# Patient Record
Sex: Female | Born: 1985
Health system: Southern US, Community
[De-identification: ages and names within clinical notes are randomized; demographics above are authoritative.]

## PROBLEM LIST (undated history)

## (undated) DIAGNOSIS — R112 Nausea with vomiting, unspecified: Secondary | ICD-10-CM

## (undated) DIAGNOSIS — Z9889 Other specified postprocedural states: Secondary | ICD-10-CM

## (undated) DIAGNOSIS — K219 Gastro-esophageal reflux disease without esophagitis: Secondary | ICD-10-CM

## (undated) DIAGNOSIS — F419 Anxiety disorder, unspecified: Secondary | ICD-10-CM

## (undated) DIAGNOSIS — Z789 Other specified health status: Secondary | ICD-10-CM

## (undated) DIAGNOSIS — D649 Anemia, unspecified: Secondary | ICD-10-CM

## (undated) HISTORY — PX: SKIN BIOPSY: SHX1

## (undated) HISTORY — DX: Anemia, unspecified: D64.9

---

## 2010-01-03 DIAGNOSIS — O321XX0 Maternal care for breech presentation, not applicable or unspecified: Secondary | ICD-10-CM

## 2014-01-15 LAB — OB RESULTS CONSOLE GC/CHLAMYDIA
CHLAMYDIA, DNA PROBE: NEGATIVE
GC PROBE AMP, GENITAL: NEGATIVE

## 2014-01-24 LAB — OB RESULTS CONSOLE ABO/RH: RH Type: POSITIVE

## 2014-01-24 LAB — OB RESULTS CONSOLE HEPATITIS B SURFACE ANTIGEN: Hepatitis B Surface Ag: NEGATIVE

## 2014-01-24 LAB — OB RESULTS CONSOLE ANTIBODY SCREEN: ANTIBODY SCREEN: NEGATIVE

## 2014-01-24 LAB — OB RESULTS CONSOLE RUBELLA ANTIBODY, IGM: RUBELLA: IMMUNE

## 2014-01-24 LAB — OB RESULTS CONSOLE RPR: RPR: NONREACTIVE

## 2014-01-24 LAB — OB RESULTS CONSOLE VARICELLA ZOSTER ANTIBODY, IGG: VARICELLA IGG: IMMUNE

## 2014-01-24 LAB — OB RESULTS CONSOLE HIV ANTIBODY (ROUTINE TESTING): HIV: NONREACTIVE

## 2014-08-16 ENCOUNTER — Ambulatory Visit
Admit: 2014-08-16 | Disposition: A | Discharge status: Home / Self Care | Payer: Self-pay | Attending: Obstetrics & Gynecology | Admitting: Obstetrics & Gynecology

## 2014-08-17 LAB — OB RESULTS CONSOLE GBS: GBS: POSITIVE

## 2014-08-31 ENCOUNTER — Encounter
Admission: RE | Admit: 2014-08-31 | Discharge: 2014-08-31 | Disposition: A | Discharge status: Home / Self Care | Payer: BLUE CROSS/BLUE SHIELD | Source: Ambulatory Visit | Attending: Obstetrics and Gynecology | Admitting: Obstetrics and Gynecology

## 2014-08-31 DIAGNOSIS — Z3490 Encounter for supervision of normal pregnancy, unspecified, unspecified trimester: Secondary | ICD-10-CM | POA: Insufficient documentation

## 2014-08-31 DIAGNOSIS — O3421 Maternal care for scar from previous cesarean delivery: Secondary | ICD-10-CM | POA: Insufficient documentation

## 2014-08-31 DIAGNOSIS — Z9889 Other specified postprocedural states: Secondary | ICD-10-CM | POA: Insufficient documentation

## 2014-08-31 HISTORY — DX: Other specified health status: Z78.9

## 2014-08-31 LAB — CBC WITH DIFFERENTIAL/PLATELET
Basophils Absolute: 0 10*3/uL (ref 0–0.1)
Basophils Relative: 0 %
EOS ABS: 0.2 10*3/uL (ref 0–0.7)
EOS PCT: 2 %
HCT: 30.6 % — ABNORMAL LOW (ref 35.0–47.0)
Hemoglobin: 9.6 g/dL — ABNORMAL LOW (ref 12.0–16.0)
Lymphocytes Relative: 17 %
Lymphs Abs: 1.8 10*3/uL (ref 1.0–3.6)
MCH: 24.4 pg — AB (ref 26.0–34.0)
MCHC: 31.5 g/dL — AB (ref 32.0–36.0)
MCV: 77.5 fL — ABNORMAL LOW (ref 80.0–100.0)
MONO ABS: 0.8 10*3/uL (ref 0.2–0.9)
Monocytes Relative: 8 %
Neutro Abs: 7.9 10*3/uL — ABNORMAL HIGH (ref 1.4–6.5)
Neutrophils Relative %: 73 %
PLATELETS: 241 10*3/uL (ref 150–440)
RBC: 3.95 MIL/uL (ref 3.80–5.20)
RDW: 15.1 % — AB (ref 11.5–14.5)
WBC: 10.8 10*3/uL (ref 3.6–11.0)

## 2014-08-31 LAB — TYPE AND SCREEN
ABO/RH(D): B POS
Antibody Screen: NEGATIVE

## 2014-08-31 LAB — ABO/RH: ABO/RH(D): B POS

## 2014-08-31 NOTE — Progress Notes (Signed)
Patient given CHG wipes, written info regarding cesarean section, incentive spirometry, infection prevention and instructions on CHG use.

## 2014-08-31 NOTE — Discharge Instructions (Signed)
Incentive Spirometer An incentive spirometer is a tool that can help keep your lungs clear and active. This tool measures how well you are filling your lungs with each breath. Taking long, deep breaths may help reverse or decrease the chance of developing breathing (pulmonary) problems (especially infection) following:  Surgery of the chest or abdomen.  Surgery if you have a history of smoking or a lung problem.  A long period of time when you are unable to move or be active. BEFORE THE PROCEDURE   If the spirometer includes an indicator to show your best effort, your nurse or respiratory therapist will set it to a desired goal.  If possible, sit up straight or lean slightly forward. Try not to slouch.  Hold the incentive spirometer in an upright position. INSTRUCTIONS FOR USE   Sit on the edge of your bed if possible, or sit up as far as you can in bed or on a chair.  Hold the incentive spirometer in an upright position.  Breathe out normally.  Place the mouthpiece in your mouth and seal your lips tightly around it.  Breathe in slowly and as deeply as possible, raising the piston or the ball toward the top of the column.  Hold your breath for 3-5 seconds or for as long as possible. Allow the piston or ball to fall to the bottom of the column.  Remove the mouthpiece from your mouth and breathe out normally.  Rest for a few seconds and repeat Steps 1 through 7 at least 10 times every 1-2 hours when you are awake. Take your time and take a few normal breaths between deep breaths.  The spirometer may include an indicator to show your best effort. Use the indicator as a goal to work toward during each repetition.  After each set of 10 deep breaths, practice coughing to be sure your lungs are clear. If you have an incision (the cut made at the time of surgery), support your incision when coughing by placing a pillow or rolled-up towels firmly against it. Once you are able to get out of  bed, walk around indoors and cough well. You may stop using the incentive spirometer when instructed by your caregiver.  RISKS AND COMPLICATIONS  Breathing too quickly may cause dizziness. At an extreme, this could cause you to pass out. Take your time so you do not get dizzy or light-headed.  If you are in pain, you may need to take or ask for pain medication before doing incentive spirometry. It is harder to take a deep breath if you are having pain. AFTER USE  Rest and breathe slowly and easily.  It can be helpful to keep a log of your progress. Your caregiver can provide you with a simple table to help with this. If you are using the spirometer at home, follow these instructions: [place] IF:   You are having difficultly using the spirometer.  You have trouble using the spirometer as often as instructed.  Your pain medication is not giving enough relief while using the spirometer.  You develop fever of 100.46F (38.1C) or higher. SEEK IMMEDIATE MEDICAL CARE IF:   You cough up bloody sputum that had not been present before.  You develop fever of 102F (38.9C) or greater.  You develop worsening pain at or near the incision site. MAKE SURE YOU:   Understand these instructions.  Will watch your condition.  Will get help right away if you are not doing well or  get worse. Document Released: 08/24/2006 Document Revised: 08/28/2013 Document Reviewed: 10/25/2006 [facility] Patient Information 2015 [place], [place]. This information is not intended to replace advice given to you by your health care provider. Make sure you discuss any questions you have with your health care provider. Surgical Site Infections FAQs What is a Surgical Site Infection (SSI)? A surgical site infection is an infection that occurs after surgery in the part of the body where the surgery took place. Most patients who have surgery do not develop an infection. However, infections develop in about 1 to 3  out of every 100 patients who have surgery. Some of the common symptoms of a surgical site infection are:  Redness and pain around the area where you had surgery  Drainage of cloudy fluid from your surgical wound  Fever Can SSIs be treated? Yes. Most surgical site infections can be treated with antibiotics. The antibiotic given to you depends on the bacteria (germs) causing the infections. Sometimes patients with SSIs also need another surgery to treat the infection. What are some of the things that hospitals are doing to prevent SSIs? To prevent SSIs, doctors, nurses, and other healthcare providers:  Clean their hands and arms up to their elbows with an antiseptic agent just before the surgery.  Clean their hands with soap and water or an alcohol-based hand rub before and after caring for each patient.  May remove some of your hair immediately before your surgery using electric clippers if the hair is in the same area where the procedure will occur. They should not shave you with a razor.  Wear special hair covers, masks, gowns, and gloves during surgery to keep the surgery area clean.  Give you antibiotics before your surgery starts. In most cases, you should get antibiotics within 60 minutes before the surgery starts and the antibiotics should be stopped within 24 hours after surgery.  Clean the skin at the site of your surgery with a special soap that kills germs. What can I do to help prevent SSIs? Before your surgery:  Tell your doctor about other medical problems you may have. Health problems such as allergies, diabetes, and obesity could affect your surgery and your treatment.  Quit smoking. Patients who smoke get more infections. Talk to your doctor about how you can quit before your surgery.  Do not shave near where you will have surgery. Shaving with a razor can irritate your skin and make it easier to develop an infection. At the time of your surgery:  Speak up if  someone tries to shave you with a razor before surgery. Ask why you need to be shaved and talk with your surgeon if you have any concerns.  Ask if you will get antibiotics before surgery. After your surgery:  Make sure that your healthcare providers clean their hands before examining you, either with soap and water or an alcohol-based hand rub.  If you do not see your providers clean their hands, please ask them to do so.  Family and friends who visit you should not touch the surgical wound or dressings.  Family and friends should clean their hands with soap and water or an alcohol-based hand rub before and after visiting you. If you do not see them clean their hands, ask them to clean their hands. What do I need to do when I go home from the hospital?  Before you go home, your doctor or nurse should explain everything you need to know about taking care of your  wound. Make sure you understand how to care for your wound before you leave the hospital.  Always clean your hands before and after caring for your wound.  Before you go home, make sure you know who to contact if you have questions or problems after you get home.  If you have any symptoms of an infection, such as redness and pain at the surgery site, drainage, or fever, call your doctor immediately. If you have additional questions, please ask your doctor or nurse. Developed and co-sponsored by [employer] for [place] [phone]); Infectious Diseases Society of [place] (IDSA); [place]; Association for Professionals in Infection Control and Epidemiology (APIC); Centers for Disease Control and Prevention (CDC); and [employer]. Document Released: 04/18/2013 Document Reviewed: 04/18/2013 [facility] Patient Information 2015 [place], [place]. This information is not intended to replace advice given to you by your health care provider. Make sure you discuss any questions you have with your  health care provider.

## 2014-09-01 LAB — RPR: RPR: NONREACTIVE

## 2014-09-01 LAB — HIV ANTIBODY (ROUTINE TESTING W REFLEX): HIV Screen 4th Generation wRfx: NONREACTIVE

## 2014-09-02 ENCOUNTER — Inpatient Hospital Stay
Admission: EM | Admit: 2014-09-02 | Discharge: 2014-09-06 | DRG: 766 | Disposition: A | Discharge status: Home / Self Care | Payer: BLUE CROSS/BLUE SHIELD | Attending: Obstetrics and Gynecology | Admitting: Obstetrics and Gynecology

## 2014-09-02 DIAGNOSIS — Z3490 Encounter for supervision of normal pregnancy, unspecified, unspecified trimester: Secondary | ICD-10-CM

## 2014-09-02 DIAGNOSIS — O34219 Maternal care for unspecified type scar from previous cesarean delivery: Secondary | ICD-10-CM

## 2014-09-02 DIAGNOSIS — Z98891 History of uterine scar from previous surgery: Secondary | ICD-10-CM

## 2014-09-02 DIAGNOSIS — O3421 Maternal care for scar from previous cesarean delivery: Secondary | ICD-10-CM | POA: Diagnosis present

## 2014-09-02 DIAGNOSIS — Z3A38 38 weeks gestation of pregnancy: Secondary | ICD-10-CM | POA: Diagnosis present

## 2014-09-02 DIAGNOSIS — O99824 Streptococcus B carrier state complicating childbirth: Principal | ICD-10-CM | POA: Diagnosis present

## 2014-09-02 HISTORY — DX: Other specified postprocedural states: Z98.890

## 2014-09-02 HISTORY — DX: Nausea with vomiting, unspecified: R11.2

## 2014-09-02 MED ORDER — FENTANYL CITRATE (PF) 100 MCG/2ML IJ SOLN
INTRAMUSCULAR | Status: AC
Start: 2014-09-02 — End: 2014-09-02
  Filled 2014-09-02: qty 2

## 2014-09-02 MED ORDER — LACTATED RINGERS IV BOLUS (SEPSIS)
500.0000 mL | Freq: Once | INTRAVENOUS | Status: AC
  Administered 2014-09-02: 500 mL via INTRAVENOUS

## 2014-09-02 MED ORDER — LACTATED RINGERS IV SOLN: INTRAVENOUS | Status: DC

## 2014-09-02 MED ORDER — FENTANYL CITRATE (PF) 100 MCG/2ML IJ SOLN
25.0000 ug | INTRAMUSCULAR | Status: AC
  Administered 2014-09-02: 21:00:00 via INTRAVENOUS

## 2014-09-02 MED ORDER — FENTANYL CITRATE (PF) 100 MCG/2ML IJ SOLN
50.0000 ug | Freq: Once | INTRAMUSCULAR | Status: AC
Start: 2014-09-02 — End: 2014-09-02
  Administered 2014-09-02: 50 ug via INTRAVENOUS

## 2014-09-02 MED ORDER — FENTANYL CITRATE (PF) 100 MCG/2ML IJ SOLN
INTRAMUSCULAR | Status: AC
  Administered 2014-09-02: 50 ug via INTRAVENOUS
  Filled 2014-09-02: qty 2

## 2014-09-02 NOTE — MAU Provider Note (Signed)
OB History & Physical   History of Present Illness:  Chief Complaint:   HPI:  Lisa Walker is a 29 y.o. G75P0011 female at [redacted]w[redacted]d dated by 7 week ultrasound.  Her pregnancy has been complicated by a history of prior cesarean delivery.  She presents to L&D for evaluation of contractions since this morning, getting gradually worse since mid afternoon.    She denies Leakage of fluid.   She denies Vaginal Bleeding.   She notes good Fetal movement.    Maternal Medical History:   Past Medical History  Diagnosis Date  . Medical history non-contributory   . PONV (postoperative nausea and vomiting)     Past Surgical History  Procedure Laterality Date  . Cesarean section N/A 2011    performed in [place]   Allergies: No Known Allergies  Home medications:  None          OB History  Gravida Para Term Preterm AB SAB TAB Ectopic Multiple Living  3 1   1 1    0 1    # Outcome Date GA Lbr Len/2nd Weight Sex Delivery Anes PTL Lv  3 Current           2 Para 01/03/10 [redacted]w[redacted]d  3.5 kg (7 lb 11.5 oz) F CS-Unspec Gen N Y     Complications: Breech delivery  1 SAB 11/25/08 [redacted]w[redacted]d   U SAB None N       Prenatal care site: [facility]  Social History: She  reports that she has never smoked. She has never used smokeless tobacco. She reports that she does not drink alcohol or use illicit drugs.  Family History: family history includes Diabetes in her father.   Review of Systems: Negative x 10 systems reviewed except as noted in the HPI.     Physical Exam:  Vital Signs: BP 116/84 mmHg  Pulse 105  Temp(Src) 97.4 F (36.3 C) (Oral)  Resp 16  LMP 11/27/2013 General: mild distress with contractions, tearful HEENT: normocephalic, atraumatic Heart: regular rate & rhythm.  No murmurs/rubs/gallops Lungs: clear to auscultation bilaterally Abdomen: soft, gravid, non-tender;  EFW: 7.5 pounds Pelvic:   External: Normal external female genitalia  Cervix: Dilation: 1 / Effacement (%): 50 / Station: -2    Extremities: non-tender, symmetric, no edema bilaterally.  DTRs: 2+  Neurologic: Alert & oriented x 3.    Pertinent Results:  Prenatal Labs: Blood type/Rh B positive  Antibody screen neg  Rubella Immune  RPR Non-reactive  HBsAg neg  HIV neg  GC neg  Chlamydia neg  Genetic screening  informaseq negative (XY)2  1 hour GTT 148  3 hour GTT wnl  GBS Positive on 08/17/14   Baseline FHR: baseline 135 beats per min    Variability: moderate    Accelerations: positive2    Decelerations: negative Contractions: present frequency: 3-4 q 10 min Overall assessment: category 1    Assessment:  Lisa Walker is a 29 y.o. G75P0011 female at [redacted]w[redacted]d with contractions.   Plan:  1. Labor: will re-check after two hours or prn. Scheduled to have repeat CD tomorrow. 2. FWB: reassuring overall 3. GBS positive.  Will Bonnet, MD, [place] 09/02/2014 6:15 PM

## 2014-09-02 NOTE — Anesthesia Preprocedure Evaluation (Signed)
Anesthesia Evaluation  Patient identified by MRN, date of birth, ID band Patient awake    Reviewed: Allergy & Precautions, NPO status , Patient's Chart, lab work & pertinent test results  History of Anesthesia Complications Negative for: history of anesthetic complications  Airway Mallampati: III  TM Distance: >3 FB Neck ROM: Full    Dental no notable dental hx.    Pulmonary neg pulmonary ROS,  breath sounds clear to auscultation  Pulmonary exam normal       Cardiovascular Exercise Tolerance: Good negative cardio ROS Normal cardiovascular examRhythm:Regular Rate:Normal     Neuro/Psych negative neurological ROS  negative psych ROS   GI/Hepatic negative GI ROS, Neg liver ROS,   Endo/Other  negative endocrine ROS  Renal/GU negative Renal ROS  negative genitourinary   Musculoskeletal negative musculoskeletal ROS (+)   Abdominal   Peds negative pediatric ROS (+)  Hematology negative hematology ROS (+)   Anesthesia Other Findings   Reproductive/Obstetrics (+) Pregnancy                             Anesthesia Physical Anesthesia Plan  ASA: II  Anesthesia Plan: Spinal   Post-op Pain Management:    Induction:   Airway Management Planned: Nasal Cannula  Additional Equipment:   Intra-op Plan:   Post-operative Plan:   Informed Consent: I have reviewed the patients History and Physical, chart, labs and discussed the procedure including the risks, benefits and alternatives for the proposed anesthesia with the patient or authorized representative who has indicated his/her understanding and acceptance.     Plan Discussed with: CRNA and Surgeon  Anesthesia Plan Comments:         Anesthesia Quick Evaluation

## 2014-09-02 NOTE — OB Triage Note (Signed)
29 yo female complains of contractions since this morning that became worse this afternoon and closer together.  States some bleeding noted with wiping.

## 2014-09-03 ENCOUNTER — Encounter
Admission: EM | Disposition: A | Discharge status: Home / Self Care | Payer: Self-pay | Source: Home / Self Care | Attending: Obstetrics and Gynecology

## 2014-09-03 ENCOUNTER — Encounter: Payer: Self-pay | Admitting: *Deleted

## 2014-09-03 ENCOUNTER — Observation Stay: Payer: BLUE CROSS/BLUE SHIELD | Admitting: Anesthesiology

## 2014-09-03 ENCOUNTER — Inpatient Hospital Stay
Admission: RE | Admit: 2014-09-03 | Payer: BLUE CROSS/BLUE SHIELD | Source: Ambulatory Visit | Admitting: Obstetrics and Gynecology

## 2014-09-03 DIAGNOSIS — Z98891 History of uterine scar from previous surgery: Secondary | ICD-10-CM

## 2014-09-03 DIAGNOSIS — O3421 Maternal care for scar from previous cesarean delivery: Secondary | ICD-10-CM | POA: Diagnosis present

## 2014-09-03 DIAGNOSIS — O99824 Streptococcus B carrier state complicating childbirth: Secondary | ICD-10-CM | POA: Diagnosis present

## 2014-09-03 DIAGNOSIS — Z3A38 38 weeks gestation of pregnancy: Secondary | ICD-10-CM | POA: Diagnosis present

## 2014-09-03 DIAGNOSIS — O99820 Streptococcus B carrier state complicating pregnancy: Secondary | ICD-10-CM | POA: Diagnosis present

## 2014-09-03 SURGERY — Surgical Case
Anesthesia: Spinal | Wound class: Clean Contaminated

## 2014-09-03 MED ORDER — DIBUCAINE 1 % RE OINT
1.0000 | TOPICAL_OINTMENT | RECTAL | Status: DC | PRN
Start: 2014-09-03 — End: 2014-09-06

## 2014-09-03 MED ORDER — FERROUS SULFATE 325 (65 FE) MG PO TABS
325.0000 mg | ORAL_TABLET | Freq: Two times a day (BID) | ORAL | Status: DC
  Administered 2014-09-03 – 2014-09-05 (×4): 325 mg via ORAL
  Filled 2014-09-03 (×5): qty 1

## 2014-09-03 MED ORDER — NALOXONE HCL 1 MG/ML IJ SOLN: 1.0000 ug/kg/h | INTRAVENOUS | Status: DC | PRN

## 2014-09-03 MED ORDER — FENTANYL CITRATE (PF) 100 MCG/2ML IJ SOLN: 25.0000 ug | INTRAMUSCULAR | Status: DC | PRN

## 2014-09-03 MED ORDER — BUPIVACAINE 0.25 % ON-Q PUMP DUAL CATH 400 ML
400.0000 mL | INJECTION | Status: DC
  Filled 2014-09-03: qty 400

## 2014-09-03 MED ORDER — CEFAZOLIN SODIUM-DEXTROSE 2-3 GM-% IV SOLR
2.0000 g | INTRAVENOUS | Status: AC
Start: 2014-09-03 — End: 2014-09-03
  Administered 2014-09-03: 2 g via INTRAVENOUS

## 2014-09-03 MED ORDER — SENNOSIDES-DOCUSATE SODIUM 8.6-50 MG PO TABS
2.0000 | ORAL_TABLET | ORAL | Status: DC
  Filled 2014-09-03: qty 2

## 2014-09-03 MED ORDER — DIPHENHYDRAMINE HCL 25 MG PO CAPS
25.0000 mg | ORAL_CAPSULE | ORAL | Status: DC | PRN
  Administered 2014-09-03 (×2): 25 mg via ORAL
  Filled 2014-09-03 (×2): qty 1

## 2014-09-03 MED ORDER — MENTHOL 3 MG MT LOZG
1.0000 | LOZENGE | OROMUCOSAL | Status: DC | PRN
  Administered 2014-09-04: 3 mg via ORAL
  Filled 2014-09-03: qty 9

## 2014-09-03 MED ORDER — LANOLIN HYDROUS EX OINT: 1.0000 "application " | TOPICAL_OINTMENT | CUTANEOUS | Status: DC | PRN

## 2014-09-03 MED ORDER — MEPERIDINE HCL 25 MG/ML IJ SOLN: 6.2500 mg | INTRAMUSCULAR | Status: DC | PRN

## 2014-09-03 MED ORDER — FENTANYL CITRATE (PF) 100 MCG/2ML IJ SOLN
INTRAMUSCULAR | Status: DC | PRN
  Administered 2014-09-03: 15 ug via INTRATHECAL

## 2014-09-03 MED ORDER — ONDANSETRON HCL 4 MG/2ML IJ SOLN: 4.0000 mg | Freq: Once | INTRAMUSCULAR | Status: DC | PRN

## 2014-09-03 MED ORDER — BUPIVACAINE HCL 0.5 % IJ SOLN
INTRAMUSCULAR | Status: DC | PRN
  Administered 2014-09-03: 10 mL

## 2014-09-03 MED ORDER — DIPHENHYDRAMINE HCL 25 MG PO CAPS: 25.0000 mg | ORAL_CAPSULE | Freq: Four times a day (QID) | ORAL | Status: DC | PRN

## 2014-09-03 MED ORDER — KETOROLAC TROMETHAMINE 30 MG/ML IJ SOLN
30.0000 mg | Freq: Four times a day (QID) | INTRAMUSCULAR | Status: DC | PRN
  Administered 2014-09-03: 30 mg via INTRAVENOUS
  Filled 2014-09-03 (×2): qty 1

## 2014-09-03 MED ORDER — EPHEDRINE SULFATE 50 MG/ML IJ SOLN
INTRAMUSCULAR | Status: DC | PRN
  Administered 2014-09-03: 15 mg via INTRAVENOUS

## 2014-09-03 MED ORDER — PHENYLEPHRINE HCL 10 MG/ML IJ SOLN
INTRAMUSCULAR | Status: DC | PRN
  Administered 2014-09-03: 100 ug via INTRAVENOUS

## 2014-09-03 MED ORDER — IBUPROFEN 800 MG PO TABS
800.0000 mg | ORAL_TABLET | Freq: Three times a day (TID) | ORAL | Status: DC
Start: 2014-09-04 — End: 2014-09-06
  Administered 2014-09-04 – 2014-09-06 (×6): 800 mg via ORAL
  Filled 2014-09-03 (×6): qty 1

## 2014-09-03 MED ORDER — NALOXONE HCL 0.4 MG/ML IJ SOLN: 0.4000 mg | INTRAMUSCULAR | Status: DC | PRN

## 2014-09-03 MED ORDER — BUPIVACAINE ON-Q PAIN PUMP (FOR ORDER SET NO CHG)
INJECTION | Status: DC
  Filled 2014-09-03: qty 1

## 2014-09-03 MED ORDER — CEFAZOLIN SODIUM-DEXTROSE 2-3 GM-% IV SOLR
INTRAVENOUS | Status: AC
  Filled 2014-09-03: qty 50

## 2014-09-03 MED ORDER — OXYTOCIN 40 UNITS IN LACTATED RINGERS INFUSION - SIMPLE MED
62.5000 mL/h | INTRAVENOUS | Status: AC
  Administered 2014-09-03: 62.5 mL/h via INTRAVENOUS
  Filled 2014-09-03: qty 1000

## 2014-09-03 MED ORDER — SIMETHICONE 80 MG PO CHEW
80.0000 mg | CHEWABLE_TABLET | Freq: Three times a day (TID) | ORAL | Status: DC
  Administered 2014-09-03 – 2014-09-06 (×8): 80 mg via ORAL
  Filled 2014-09-03 (×8): qty 1

## 2014-09-03 MED ORDER — WITCH HAZEL-GLYCERIN EX PADS: 1.0000 "application " | MEDICATED_PAD | CUTANEOUS | Status: DC | PRN

## 2014-09-03 MED ORDER — BUPIVACAINE 0.25 % ON-Q PUMP DUAL CATH 400 ML
INJECTION | Status: AC
  Filled 2014-09-03: qty 400

## 2014-09-03 MED ORDER — NALBUPHINE HCL 10 MG/ML IJ SOLN: 5.0000 mg | Freq: Once | INTRAMUSCULAR | Status: AC | PRN

## 2014-09-03 MED ORDER — SODIUM CHLORIDE 0.9 % IJ SOLN: 3.0000 mL | INTRAMUSCULAR | Status: DC | PRN

## 2014-09-03 MED ORDER — CHLORHEXIDINE GLUCONATE CLOTH 2 % EX PADS: 2.0000 | MEDICATED_PAD | Freq: Every day | CUTANEOUS | Status: DC

## 2014-09-03 MED ORDER — PRENATAL MULTIVITAMIN CH
1.0000 | ORAL_TABLET | Freq: Every day | ORAL | Status: DC
  Administered 2014-09-03 – 2014-09-06 (×4): 1 via ORAL
  Filled 2014-09-03 (×4): qty 1

## 2014-09-03 MED ORDER — BUPIVACAINE HCL (PF) 0.5 % IJ SOLN
INTRAMUSCULAR | Status: AC
  Filled 2014-09-03: qty 30

## 2014-09-03 MED ORDER — LACTATED RINGERS IV SOLN
INTRAVENOUS | Status: DC
  Administered 2014-09-03: 04:00:00 via INTRAVENOUS

## 2014-09-03 MED ORDER — LACTATED RINGERS IV BOLUS (SEPSIS)
1000.0000 mL | Freq: Once | INTRAVENOUS | Status: AC
  Administered 2014-09-03: 1700 mL via INTRAVENOUS
  Administered 2014-09-03: 01:00:00 via INTRAVENOUS

## 2014-09-03 MED ORDER — NALBUPHINE HCL 10 MG/ML IJ SOLN: 5.0000 mg | INTRAMUSCULAR | Status: DC | PRN

## 2014-09-03 MED ORDER — MORPHINE SULFATE (PF) 0.5 MG/ML IJ SOLN
INTRAMUSCULAR | Status: DC | PRN
  Administered 2014-09-03: .15 mg via EPIDURAL

## 2014-09-03 MED ORDER — OXYCODONE-ACETAMINOPHEN 5-325 MG PO TABS
1.0000 | ORAL_TABLET | ORAL | Status: DC | PRN
  Administered 2014-09-03 – 2014-09-06 (×12): 1 via ORAL
  Filled 2014-09-03 (×12): qty 1

## 2014-09-03 MED ORDER — DIPHENHYDRAMINE HCL 50 MG/ML IJ SOLN: 12.5000 mg | INTRAMUSCULAR | Status: DC | PRN

## 2014-09-03 MED ORDER — KETOROLAC TROMETHAMINE 30 MG/ML IJ SOLN: 30.0000 mg | Freq: Four times a day (QID) | INTRAMUSCULAR | Status: DC | PRN

## 2014-09-03 MED ORDER — ONDANSETRON HCL 4 MG/2ML IJ SOLN: 4.0000 mg | Freq: Three times a day (TID) | INTRAMUSCULAR | Status: DC | PRN

## 2014-09-03 MED ORDER — BUPIVACAINE HCL 0.25 % IJ SOLN: 5.0000 mL | Freq: Once | INTRAMUSCULAR | Status: DC

## 2014-09-03 MED ORDER — ZOLPIDEM TARTRATE 5 MG PO TABS
5.0000 mg | ORAL_TABLET | Freq: Every evening | ORAL | Status: DC | PRN
  Administered 2014-09-03: 5 mg via ORAL
  Filled 2014-09-03: qty 1

## 2014-09-03 MED ORDER — CEFAZOLIN SODIUM-DEXTROSE 2-3 GM-% IV SOLR
INTRAVENOUS | Status: AC
  Administered 2014-09-03: 2 g via INTRAVENOUS
  Filled 2014-09-03: qty 50

## 2014-09-03 MED ORDER — POTASSIUM CITRATE-CITRIC ACID 1100-334 MG/5ML PO SOLN
10.0000 meq | Freq: Three times a day (TID) | ORAL | Status: DC
  Administered 2014-09-03: 10 meq via ORAL

## 2014-09-03 MED ORDER — GUAIFENESIN ER 600 MG PO TB12
600.0000 mg | ORAL_TABLET | Freq: Two times a day (BID) | ORAL | Status: DC
  Administered 2014-09-03 – 2014-09-06 (×6): 600 mg via ORAL
  Filled 2014-09-03 (×5): qty 1

## 2014-09-03 MED ORDER — ONDANSETRON HCL 4 MG/2ML IJ SOLN
INTRAMUSCULAR | Status: DC | PRN
  Administered 2014-09-03: 4 mg via INTRAVENOUS

## 2014-09-03 MED ORDER — LACTATED RINGERS IV SOLN
INTRAVENOUS | Status: DC
  Administered 2014-09-03: via INTRAVENOUS

## 2014-09-03 SURGICAL SUPPLY — 27 items
CANISTER SUCT 3000ML (MISCELLANEOUS) ×3 IMPLANT
CATH KIT ON-Q SILVERSOAK 5IN (CATHETERS) ×3 IMPLANT
CHLORAPREP W/TINT 26ML (MISCELLANEOUS) ×3 IMPLANT
CLOSURE WOUND 1/2 X4 (GAUZE/BANDAGES/DRESSINGS) ×1
DRSG TELFA 3X8 NADH (GAUZE/BANDAGES/DRESSINGS) ×3 IMPLANT
ELECT CAUTERY BLADE 6.4 (BLADE) ×3 IMPLANT
GAUZE SPONGE 4X4 12PLY STRL (GAUZE/BANDAGES/DRESSINGS) ×3 IMPLANT
GLOVE BIO SURGEON STRL SZ7 (GLOVE) ×9 IMPLANT
GLOVE INDICATOR 7.5 STRL GRN (GLOVE) ×9 IMPLANT
GOWN STRL REUS W/ TWL LRG LVL3 (GOWN DISPOSABLE) ×1 IMPLANT
GOWN STRL REUS W/ TWL XL LVL3 (GOWN DISPOSABLE) ×2 IMPLANT
GOWN STRL REUS W/TWL LRG LVL3 (GOWN DISPOSABLE) ×2
GOWN STRL REUS W/TWL XL LVL3 (GOWN DISPOSABLE) ×4
LIQUID BAND (GAUZE/BANDAGES/DRESSINGS) ×3 IMPLANT
NS IRRIG 1000ML POUR BTL (IV SOLUTION) ×3 IMPLANT
PACK C SECTION AR (MISCELLANEOUS) ×3 IMPLANT
PAD GROUND ADULT SPLIT (MISCELLANEOUS) ×3 IMPLANT
PAD OB MATERNITY 4.3X12.25 (PERSONAL CARE ITEMS) ×3 IMPLANT
PAD PREP 24X41 OB/GYN DISP (PERSONAL CARE ITEMS) ×3 IMPLANT
STRIP CLOSURE SKIN 1/2X4 (GAUZE/BANDAGES/DRESSINGS) ×2 IMPLANT
SUT MAXON ABS #0 GS21 30IN (SUTURE) ×6 IMPLANT
SUT PLAIN 2 0 (SUTURE) ×4
SUT PLAIN ABS 2-0 CT1 27XMFL (SUTURE) ×2 IMPLANT
SUT VIC AB 1 CT1 36 (SUTURE) ×6 IMPLANT
SUT VIC AB 2-0 CT1 36 (SUTURE) ×3 IMPLANT
SUT VIC AB 4-0 FS2 27 (SUTURE) ×3 IMPLANT
SYRINGE 10CC LL (SYRINGE) ×3 IMPLANT

## 2014-09-03 NOTE — Anesthesia Procedure Notes (Signed)
Spinal Patient location during procedure: OR Start time: 09/03/2014 12:31 AM End time: 09/03/2014 12:33 AM Staffing Anesthesiologist: Lorane Gell Performed by: anesthesiologist  Preanesthetic Checklist Completed: patient identified, site marked, surgical consent, pre-op evaluation, timeout performed, IV checked, risks and benefits discussed and monitors and equipment checked Spinal Block Patient position: sitting Prep: ChloraPrep Patient monitoring: heart rate, continuous pulse ox and blood pressure Approach: midline Location: L3-4 Injection technique: single-shot Needle Needle type: [place]  Needle gauge: 25 G Needle length: 5 cm Assessment Sensory level: T4 Additional Notes Pt tolerated procedure well.  No immediate complications

## 2014-09-03 NOTE — Anesthesia Postprocedure Evaluation (Signed)
  Anesthesia Post-op Note  Patient: Lisa Walker  Procedure(s) Performed: Procedure(s): repeat CESAREAN SECTION (N/A)  Anesthesia type:Spinal  Patient location: PACU  Post pain: Pain level controlled  Post assessment: Post-op Vital signs reviewed, Patient's Cardiovascular Status Stable, Respiratory Function Stable, Patent Airway and No signs of Nausea or vomiting  Post vital signs: Reviewed and stable  Last Vitals:  Filed Vitals:   09/03/14 0600  BP: 115/67  Pulse: 96  Temp:   Resp: 18    Level of consciousness: awake, alert  and patient cooperative  Complications: No apparent anesthesia complications

## 2014-09-03 NOTE — Progress Notes (Signed)
Subjective: Postpartum Day 0 : Cesarean Delivery- repeat Patient reports no problems. Is ready to get up and moving. Pain is well controlled.   Objective: Vital signs in last 24 hours: Temp:  [96.8 F (36 C)-99.2 F (37.3 C)] 98 F (36.7 C) (05/09 1150) Pulse Rate:  [72-134] 72 (05/09 1150) Resp:  [16-20] 18 (05/09 1150) BP: (85-124)/(52-84) 96/52 mmHg (05/09 1150) SpO2:  [97 %-100 %] 98 % (05/09 0742)  Physical Exam:  General: alert, cooperative and no distress Lochia: appropriate Uterine Fundus: firm Incision: healing well, OR dressing in place- c/d/i DVT Evaluation: No evidence of DVT seen on physical exam. SCD's in place   No results for input(s): HGB, HCT in the last 72 hours.  Assessment/Plan: Status post Cesarean section. Doing well postoperatively.  Continue current care. Will d/c IVF and foley in the am on POD 1.  Continue breastfeeding.  PO medications for pain.   Louisa Second 09/03/2014, 11:57 AM

## 2014-09-03 NOTE — Transfer of Care (Signed)
Immediate Anesthesia Transfer of Care Note  Patient: Lisa Walker  Procedure(s) Performed: Procedure(s): repeat CESAREAN SECTION (N/A)  Patient Location: PACU  Anesthesia Type:Spinal  Level of Consciousness: awake and oriented  Airway & Oxygen Therapy: Patient Spontanous Breathing  Post-op Assessment: Report given to RN  Post vital signs: Reviewed and stable  Last Vitals:  Filed Vitals:   09/02/14 2207  BP: 124/79  Pulse: 95  Temp:   Resp:     Complications: No apparent anesthesia complications

## 2014-09-03 NOTE — Op Note (Signed)
Cesarean Section Procedure Note   Joelly Guia   09/03/2014   Pre-operative Diagnosis:  1) Intrauterine pregnancy at [redacted]w[redacted]d gestational age, 2) labor, 3) history of prior cesarean delivery, desires repeat.   Post-operative Diagnosis:  1) Intrauterine pregnancy at [redacted]w[redacted]d gestational age, 41) labor, 3) history of prior cesarean delivery, desires repeat.    Surgeon: Surgeon(s) and Role:      * Will Bonnet, MD   Assistants: Rodena Medin  Anesthesia: spinal  Findings:  1) normal appearing gravid uterus, fallopian tubes, and ovaries 2) viable female infant   Estimated Blood Loss: 750 ml  Total IV Fluids: 1,600 ml   Urine Output: 30 CC OF clear urine  Specimens: None  Complications: no complications  Disposition: PACU - hemodynamically stable.   Maternal Condition: stable   Baby condition / location:  Couplet care / Skin to Skin  Procedure Details:  The patient was seen in the Holding Room. The risks, benefits, complications, treatment options, and expected outcomes were discussed with the patient. The patient concurred with the proposed plan, giving informed consent. identified as [place] and the procedure verified as C-Section Delivery. A Time Out was held and the above information confirmed.   After induction of anesthesia, the patient was draped and prepped in the usual sterile manner. A Pfannenstiel incision was made and carried down through the subcutaneous tissue to the fascia. Fascial incision was made and extended transversely. The fascia was separated from the underlying rectus tissue superiorly and inferiorly. The peritoneum was identified and entered. Peritoneal incision was extended longitudinally. The bladder flap was not bluntly freed from the lower uterine segment. A low transverse uterine incision was made and the hysterotomy was extended with cranial-caudal tension. Delivered from cephalic presentation was a 3,190 gram Living newborn infant(s) with Apgar scores of 8 at  one minute and 9 at five minutes. Cord ph was not sent the umbilical cord was clamped and cut cord blood was not obtained for evaluation. The placenta was removed Intact and appeared normal. The uterine outline, tubes and ovaries appeared normal. The uterine incision was closed with running locked sutures of 0 Vicryl.  A second layer of the same suture was thrown in an imbricating fashion.  The uterus was returned to the abdomen and the paracolic gutters were cleared of all clots and debris.   The On-Q catheter pumps were inserted in accordance with the manufacturer's recommendations.  The catheters were inserted approximately 4cm cephelad to the incision line, approximately 1cm apart, straddling the midline.  They were inserted to a depth of the 4th mark. They were positioned superficial to the rectus abdominus muscles and deep to the rectus fascia.    The fascia was then reapproximated with running sutures of 1-0 PDS, looped. The skin was closed using the Insorb subcuticular stapler.  The skin closure was reinforced using benzoin and 1/2" steri-strips and a pressure dressing was placed.   The On-Q catheters were bolused with 5 mL of 0.5% marcaine plain for a total of 10 mL.  The catheters were affixed to the skin with surgical skin glue, steri-strips, and tegaderm.    Instrument, sponge, and needle counts were correct prior the abdominal closure and were correct at the conclusion of the case.  The patient received Ancef 2 gram IV prior to skin incision (within 30 minutes). For VTE prophylaxis she was wearing SCDs throughout the case.   Signed: Will Bonnet, MD, [place] 09/03/2014 1:48 AM

## 2014-09-03 NOTE — Lactation Note (Signed)
Baby sleepy at breast, pt shown how to position baby at breast with pillows and how to support breast, breast filling and sl. Firm, massaged to increase milk flow, baby latched well to left breast and had more difficulty on right as this nipple is larger and breast more firm, but able to latch with support and nursed well, with swallows and softening of breast noted.

## 2014-09-03 NOTE — OR Nursing (Signed)
Case was originally posted under Dr Ilda Basset.  Dr Ilda Basset did not do surgery, Surgeon was Dr.Jackson. Super user was unable to repost case.

## 2014-09-03 NOTE — OR Nursing (Signed)
Placenta sent to L&D fridge with ST

## 2014-09-03 NOTE — H&P (Signed)
  OB History & Physical   History of Present Illness:  Chief Complaint:   HPI:  Lisa Walker is a 29 y.o. G17P0011 female at [redacted]w[redacted]d dated by 7 week ultrasound. Her pregnancy has been complicated by a history of prior cesarean delivery. She presents to L&D for evaluation of contractions since this morning, getting gradually worse since mid afternoon.   She denies Leakage of fluid. She denies Vaginal Bleeding. She notes good Fetal movement.   Maternal Medical History:   Past Medical History  Diagnosis Date  . Medical history non-contributory   . PONV (postoperative nausea and vomiting)     Past Surgical History  Procedure Laterality Date  . Cesarean section N/A 2011    performed in [place]   Allergies: No Known Allergies  Home medications:  None          OB History  Gravida Para Term Preterm AB SAB TAB Ectopic Multiple Living  3 1   1 1    0 1    # Outcome Date GA Lbr Len/2nd Weight Sex Delivery Anes PTL Lv  3 Current           2 Para 01/03/10 [redacted]w[redacted]d  3.5 kg (7 lb 11.5 oz) F CS-Unspec Gen N Y   Complications: Breech delivery  1 SAB 11/25/08 [redacted]w[redacted]d   U SAB None N       Prenatal care site: [facility]  Social History: She  reports that she has never smoked. She has never used smokeless tobacco. She reports that she does not drink alcohol or use illicit drugs.  Family History: family history includes Diabetes in her father.   Review of Systems: Negative x 10 systems reviewed except as noted in the HPI.    Physical Exam:  Vital Signs: BP 116/84 mmHg  Pulse 105  Temp(Src) 97.4 F (36.3 C) (Oral)  Resp 16  LMP 11/27/2013 General: mild distress with contractions, tearful HEENT: normocephalic, atraumatic Heart: regular rate & rhythm. No murmurs/rubs/gallops Lungs: clear to auscultation bilaterally Abdomen: soft, gravid, non-tender; EFW: 7.5  pounds Pelvic:  External: Normal external female genitalia Cervix: Dilation: 1 / Effacement (%): 50 / Station: -2   Extremities: non-tender, symmetric, no edema bilaterally. DTRs: 2+  Neurologic: Alert & oriented x 3.   Pertinent Results:  Prenatal Labs: Blood type/Rh B positive  Antibody screen neg  Rubella Immune  RPR Non-reactive  HBsAg neg  HIV neg  GC neg  Chlamydia neg  Genetic screening informaseq negative (XY)2  1 hour GTT 148  3 hour GTT wnl  GBS Positive on 08/17/14   Baseline FHR: baseline 135 beats per min Variability: moderate Accelerations: positive2 Decelerations: negative Contractions: present frequency: 3-4 q 10 min Overall assessment: category 1    Assessment:  Lisa Walker is a 29 y.o. G66P0011 female at [redacted]w[redacted]d with contractions.   Plan: 1.  Labor: will re-check after two hours or prn. Scheduled to have repeat CD tomorrow. 2. FWB: reassuring overall 3. GBS positive.  Will Bonnet, MD, [place] 09/02/2014 6:15 PM       Addendum:  Patient has continued to have painful contractions.  She is now [redacted]w[redacted]d GA .  She has made some cervical change and will therefore proceed to cesarean delivery.  Reviewed risks/benefits/alternatives of cesarean delivery.  Reviewed blood transfusion consent/refusal.  She accepts blood transfusion in emergency.   Will Bonnet, MD, [place] 09/03/2014 12:01 AM

## 2014-09-03 NOTE — Plan of Care (Signed)
Problem: Phase I Progression Outcomes Goal: Voiding adequately Outcome: Not Progressing Has Foley.  Problem: Phase I Progression Outcomes Goal: IS, TCDB as ordered Outcome: Progressing With assist  Problem: Phase II Progression Outcomes Goal: Incision intact & without signs/symptoms of infection Outcome: Progressing Dsg. D&I

## 2014-09-04 LAB — CBC
HEMATOCRIT: 27.6 % — AB (ref 35.0–47.0)
HEMOGLOBIN: 8.6 g/dL — AB (ref 12.0–16.0)
MCH: 24 pg — ABNORMAL LOW (ref 26.0–34.0)
MCHC: 31.3 g/dL — AB (ref 32.0–36.0)
MCV: 76.7 fL — ABNORMAL LOW (ref 80.0–100.0)
Platelets: 238 10*3/uL (ref 150–440)
RBC: 3.6 MIL/uL — AB (ref 3.80–5.20)
RDW: 15.3 % — ABNORMAL HIGH (ref 11.5–14.5)
WBC: 11.2 10*3/uL — AB (ref 3.6–11.0)

## 2014-09-04 MED ORDER — LORATADINE 10 MG PO TABS
10.0000 mg | ORAL_TABLET | Freq: Every day | ORAL | Status: DC
  Administered 2014-09-04 – 2014-09-05 (×2): 10 mg via ORAL
  Filled 2014-09-04 (×2): qty 1

## 2014-09-04 MED ORDER — GUAIFENESIN ER 600 MG PO TB12
600.0000 mg | ORAL_TABLET | Freq: Two times a day (BID) | ORAL | Status: DC | PRN
  Filled 2014-09-04: qty 1

## 2014-09-04 NOTE — Progress Notes (Addendum)
  Post Partum Day 1 Subjective: up ad lib, voiding, tolerating PO and c/o cough  Objective: Blood pressure 117/86, pulse 83, temperature 98 F (36.7 C), temperature source Oral, resp. rate 18, last menstrual period 11/27/2013, SpO2 99 %, unknown if currently breastfeeding.  Physical Exam:  General: alert Lochia: appropriate Uterine Fundus: firm Incision: healing well DVT Evaluation: No evidence of DVT seen on physical exam. Abdomen: NT, mildly distended   Recent Labs  09/04/14 0628  HGB 8.6*  HCT 27.6*    Assessment POD #1, mild blood loss anemia  Plan: Plan for discharge tomorrow  Feeding: breast  Contraception: undecided B+/RI/VI TDAP given in pregnancy    Burlene Arnt, [place] 09/04/2014, 10:40 AM

## 2014-09-05 MED ORDER — PANTOPRAZOLE SODIUM 40 MG PO TBEC
40.0000 mg | DELAYED_RELEASE_TABLET | Freq: Two times a day (BID) | ORAL | Status: DC
  Administered 2014-09-05 – 2014-09-06 (×2): 40 mg via ORAL
  Filled 2014-09-05 (×2): qty 1

## 2014-09-05 MED ORDER — FAMOTIDINE 20 MG PO TABS
20.0000 mg | ORAL_TABLET | Freq: Two times a day (BID) | ORAL | Status: DC | PRN
  Administered 2014-09-05 (×2): 20 mg via ORAL
  Filled 2014-09-05 (×2): qty 1

## 2014-09-05 NOTE — Progress Notes (Signed)
Post op day 2 repeat CS  S: up ambulating. Complains of cough/ postnasal drainage/ epigastric pain (chronic-saw a Dr in [place] been on Zantac and another similar medicine), and lumbar sacral back pain. Breast feeding. Not ready for discharge  O: afebrile 117/67 Heart: RRR without murmur Lungs: rhonchi bilaterally that cleared with cough Urinating without difficulty. Passing flatus Lochia WNL.  ABD: soft, BS active. Incision with some dried blood, intact, no inflammation. ON Q intact No calf tenderness  A: POD #2 URI Epigastric discomfort Back pain  P: Change pepcid to Pantoprazole Continue Mucinex BID Encourage coughing, deep breathing Heating pad to back. Probable discharge in AM.

## 2014-09-06 ENCOUNTER — Encounter: Payer: Self-pay | Admitting: Obstetrics and Gynecology

## 2014-09-06 MED ORDER — IBUPROFEN 800 MG PO TABS: 800.0000 mg | ORAL_TABLET | Freq: Three times a day (TID) | ORAL | Status: DC

## 2014-09-06 MED ORDER — OXYCODONE-ACETAMINOPHEN 5-325 MG PO TABS: 1.0000 | ORAL_TABLET | ORAL | Status: DC | PRN

## 2014-09-06 MED ORDER — PANTOPRAZOLE SODIUM 40 MG PO TBEC: 40.0000 mg | DELAYED_RELEASE_TABLET | Freq: Two times a day (BID) | ORAL | Status: DC

## 2014-09-06 MED ORDER — LORATADINE 10 MG PO TABS: 10.0000 mg | ORAL_TABLET | Freq: Every day | ORAL | Status: DC

## 2014-09-06 NOTE — Discharge Summary (Signed)
Obstetric Discharge Summary Reason for Admission: onset of labor and cesarean section Prenatal Procedures: NST and ultrasound Intrapartum Procedures: cesarean: low cervical, transverse Postpartum Procedures: none Complications-Operative and Postpartum: morbidity HEMOGLOBIN  Date Value Ref Range Status  09/04/2014 8.6* 12.0 - 16.0 g/dL Final   HCT  Date Value Ref Range Status  09/04/2014 27.6* 35.0 - 47.0 % Final    Physical Exam:  General: alert, cooperative and no distress Lochia: appropriate Uterine Fundus: firm Incision: healing well, c/d/i  DVT Evaluation: No evidence of DVT seen on physical exam.  Discharge Diagnoses: Term Pregnancy-delivered  Discharge Information: Date: 09/06/2014 Activity: unrestricted Diet: routine Medications: PNV, Ibuprofen and Percocet Condition: stable Instructions: refer to practice specific booklet Discharge to: home Follow-up Information    Follow up with [facility]. Schedule an appointment as soon as possible for a visit in 4 days.   Why:  for postpartum follow up/incision check    Contact information:   [phone]      Newborn Data: Live born female  Birth Weight: 7 lb 0.5 oz (3189 g) APGAR: 8, 9  Home with mother.  Louisa Second 09/06/2014, 12:15 PM

## 2014-09-06 NOTE — Lactation Note (Signed)
This note was copied from the chart of [name]. IPatient Name: Lisa Walker KKDPT'E Date: 09/06/2014  assited mother on treating engorgment                      Lactation Tools Discussed/Used     Consult Status      Daryel November 09/06/2014, 4:46 PM

## 2014-09-06 NOTE — Progress Notes (Signed)
Patient understands all discharge instructions and the need to make follow up appointments. Patient discharge via wheelchair with auxillary. 

## 2014-09-12 ENCOUNTER — Inpatient Hospital Stay: Admission: RE | Admit: 2014-09-12 | Payer: Self-pay | Source: Ambulatory Visit

## 2014-09-18 ENCOUNTER — Encounter: Admission: RE | Payer: Self-pay | Source: Ambulatory Visit

## 2014-09-18 ENCOUNTER — Inpatient Hospital Stay
Admission: RE | Admit: 2014-09-18 | Payer: BLUE CROSS/BLUE SHIELD | Source: Ambulatory Visit | Admitting: Obstetrics and Gynecology

## 2014-09-18 SURGERY — Surgical Case
Anesthesia: Spinal

## 2015-10-18 ENCOUNTER — Ambulatory Visit
Admission: EM | Admit: 2015-10-18 | Discharge: 2015-10-18 | Disposition: A | Discharge status: Home / Self Care | Payer: BLUE CROSS/BLUE SHIELD | Attending: Family Medicine | Admitting: Family Medicine

## 2015-10-18 ENCOUNTER — Ambulatory Visit: Payer: BLUE CROSS/BLUE SHIELD | Attending: Family Medicine

## 2015-10-18 DIAGNOSIS — R059 Cough, unspecified: Secondary | ICD-10-CM

## 2015-10-18 DIAGNOSIS — J0110 Acute frontal sinusitis, unspecified: Secondary | ICD-10-CM

## 2015-10-18 DIAGNOSIS — R05 Cough: Secondary | ICD-10-CM | POA: Diagnosis not present

## 2015-10-18 MED ORDER — AMOXICILLIN 875 MG PO TABS: 875.0000 mg | ORAL_TABLET | Freq: Two times a day (BID) | ORAL | Status: DC

## 2015-10-18 MED ORDER — GUAIFENESIN-CODEINE 100-10 MG/5ML PO SOLN: ORAL | Status: DC

## 2015-10-18 NOTE — ED Provider Notes (Signed)
CSN: [id number]     Arrival date & time 10/18/15  1157 History   First MD Initiated Contact with Patient 10/18/15 1247     Chief Complaint  Patient presents with  . Cough  . Abdominal Pain   (Consider location/radiation/quality/duration/timing/severity/associated sxs/prior Treatment) Patient is a 30 y.o. female presenting with URI. The history is provided by the patient.  URI Presenting symptoms: congestion, cough and facial pain   Severity:  Moderate Onset quality:  Sudden Duration:  3 weeks Timing:  Constant Progression:  Worsening Chronicity:  New Relieved by:  Nothing Ineffective treatments:  OTC medications Associated symptoms: headaches and sinus pain   Associated symptoms: no wheezing   Risk factors: not elderly, no chronic cardiac disease, no chronic kidney disease, no chronic respiratory disease, no diabetes mellitus, no immunosuppression, no recent illness, no recent travel and no sick contacts     Past Medical History  Diagnosis Date  . Medical history non-contributory   . PONV (postoperative nausea and vomiting)    Past Surgical History  Procedure Laterality Date  . Cesarean section N/A 2011    performed in [place]  . Cesarean section N/A 09/03/2014    Procedure: repeat CESAREAN SECTION;  Surgeon: Aletha Halim, MD;  Location: [facility] ORS;  Service: Obstetrics;  Laterality: N/A;   Family History  Problem Relation Age of Onset  . Diabetes Father    Social History  Substance Use Topics  . Smoking status: Never Smoker   . Smokeless tobacco: Never Used  . Alcohol Use: No   OB History    Gravida Para Term Preterm AB TAB SAB Ectopic Multiple Living   3 2 1  1  1   0 2     Review of Systems  HENT: Positive for congestion.   Respiratory: Positive for cough. Negative for wheezing.   Neurological: Positive for headaches.    Allergies  Review of patient's allergies indicates no known allergies.  Home Medications   Prior to Admission medications   Medication  Sig Start Date End Date Taking? Authorizing Provider  amoxicillin (AMOXIL) 875 MG tablet Take 1 tablet (875 mg total) by mouth 2 (two) times daily. 10/18/15   Norval Gable, MD  guaiFENesin-codeine 100-10 MG/5ML syrup 5-10 ml po qhs prn cough 10/18/15   Norval Gable, MD  ibuprofen (ADVIL,MOTRIN) 800 MG tablet Take 1 tablet (800 mg total) by mouth every 8 (eight) hours. 09/06/14   Courtney Subudhi, CNM  loratadine (CLARITIN) 10 MG tablet Take 1 tablet (10 mg total) by mouth daily. 09/06/14   Louisa Second, CNM  oxyCODONE-acetaminophen (PERCOCET/ROXICET) 5-325 MG per tablet Take 1-2 tablets by mouth every 4 (four) hours as needed for moderate pain (for pain scale 4-7). 09/06/14   Courtney Subudhi, CNM  pantoprazole (PROTONIX) 40 MG tablet Take 1 tablet (40 mg total) by mouth 2 (two) times daily. 09/06/14   Louisa Second, CNM   Meds Ordered and Administered this Visit  Medications - No data to display  BP 102/63 mmHg  Pulse 72  Temp(Src) 98 F (36.7 C) (Oral)  Resp 16  Ht 5\' 4"  (1.626 m)  Wt 128 lb (58.06 kg)  BMI 21.96 kg/m2  SpO2 100%  LMP 10/04/2015 (Exact Date) No data found.   Physical Exam  Constitutional: She appears well-developed and well-nourished. No distress.  HENT:  Head: Normocephalic and atraumatic.  Right Ear: Tympanic membrane, external ear and ear canal normal.  Left Ear: Tympanic membrane, external ear and ear canal normal.  Nose: Mucosal edema and rhinorrhea  present. No nose lacerations, sinus tenderness, nasal deformity, septal deviation or nasal septal hematoma. No epistaxis.  No foreign bodies. Right sinus exhibits maxillary sinus tenderness and frontal sinus tenderness. Left sinus exhibits maxillary sinus tenderness and frontal sinus tenderness.  Mouth/Throat: Uvula is midline, oropharynx is clear and moist and mucous membranes are normal. No oropharyngeal exudate.  Eyes: Conjunctivae and EOM are normal. Pupils are equal, round, and reactive to light. Right eye  exhibits no discharge. Left eye exhibits no discharge. No scleral icterus.  Neck: Normal range of motion. Neck supple. No thyromegaly present.  Cardiovascular: Normal rate, regular rhythm and normal heart sounds.   Pulmonary/Chest: Effort normal and breath sounds normal. No respiratory distress. She has no wheezes. She has no rales.  Lymphadenopathy:    She has no cervical adenopathy.  Skin: She is not diaphoretic.  Nursing note and vitals reviewed.   ED Course  Procedures (including critical care time)  Labs Review Labs Reviewed - No data to display  Imaging Review Dg Chest 2 View  10/18/2015  CLINICAL DATA:  Chronic cough x 3 weeks and dry in nature. Pt describes she has a hard time breathing when coughing but other wise no SOB. Some congestion EXAM: CHEST  2 VIEW COMPARISON:  None. FINDINGS: The heart size and mediastinal contours are within normal limits. Both lungs are clear. No pleural effusion or pneumothorax. The visualized skeletal structures are unremarkable. IMPRESSION: Normal chest radiographs. Electronically Signed   By: Lajean Manes M.D.   On: 10/18/2015 13:19     Visual Acuity Review  Right Eye Distance:   Left Eye Distance:   Bilateral Distance:    Right Eye Near:   Left Eye Near:    Bilateral Near:         MDM   1. Cough   2. Acute frontal sinusitis, recurrence not specified    Discharge Medication List as of 10/18/2015  1:35 PM    START taking these medications   Details  amoxicillin (AMOXIL) 875 MG tablet Take 1 tablet (875 mg total) by mouth 2 (two) times daily., Starting 10/18/2015, Until Discontinued, Normal       1. x-ray results and diagnosis reviewed with patient 2. rx as per orders above; reviewed possible side effects, interactions, risks and benefits  3. Recommend supportive treatment with otc flonase 4. Follow-up prn if symptoms worsen or don't improve    Norval Gable, MD 10/18/15 1410

## 2015-10-18 NOTE — Discharge Instructions (Signed)
Cough, Adult Coughing is a reflex that clears your throat and your airways. Coughing helps to heal and protect your lungs. It is normal to cough occasionally, but a cough that happens with other symptoms or lasts a long time may be a sign of a condition that needs treatment. A cough may last only 2-3 weeks (acute), or it may last longer than 8 weeks (chronic). CAUSES Coughing is commonly caused by:  Breathing in substances that irritate your lungs.  A viral or bacterial respiratory infection.  Allergies.  Asthma.  Postnasal drip.  Smoking.  Acid backing up from the stomach into the esophagus (gastroesophageal reflux).  Certain medicines.  Chronic lung problems, including COPD (or rarely, lung cancer).  Other medical conditions such as heart failure. HOME CARE INSTRUCTIONS  Pay attention to any changes in your symptoms. Take these actions to help with your discomfort:  Take medicines only as told by your health care provider.  If you were prescribed an antibiotic medicine, take it as told by your health care provider. Do not stop taking the antibiotic even if you start to feel better.  Talk with your health care provider before you take a cough suppressant medicine.  Drink enough fluid to keep your urine clear or pale yellow.  If the air is dry, use a cold steam vaporizer or humidifier in your bedroom or your home to help loosen secretions.  Avoid anything that causes you to cough at work or at home.  If your cough is worse at night, try sleeping in a semi-upright position.  Avoid cigarette smoke. If you smoke, quit smoking. If you need help quitting, ask your health care provider.  Avoid caffeine.  Avoid alcohol.  Rest as needed. SEEK MEDICAL CARE IF:   You have new symptoms.  You cough up pus.  Your cough does not get better after 2-3 weeks, or your cough gets worse.  You cannot control your cough with suppressant medicines and you are losing sleep.  You  develop pain that is getting worse or pain that is not controlled with pain medicines.  You have a fever.  You have unexplained weight loss.  You have night sweats. SEEK IMMEDIATE MEDICAL CARE IF:  You cough up blood.  You have difficulty breathing.  Your heartbeat is very fast.   This information is not intended to replace advice given to you by your health care provider. Make sure you discuss any questions you have with your health care provider.   Document Released: 10/10/2010 Document Revised: 01/02/2015 Document Reviewed: 06/20/2014 Elsevier Interactive Patient Education 2016 Elsevier Inc. Sinusitis, Adult Sinusitis is redness, soreness, and inflammation of the paranasal sinuses. Paranasal sinuses are air pockets within the bones of your face. They are located beneath your eyes, in the middle of your forehead, and above your eyes. In healthy paranasal sinuses, mucus is able to drain out, and air is able to circulate through them by way of your nose. However, when your paranasal sinuses are inflamed, mucus and air can become trapped. This can allow bacteria and other germs to grow and cause infection. Sinusitis can develop quickly and last only a short time (acute) or continue over a long period (chronic). Sinusitis that lasts for more than 12 weeks is considered chronic. CAUSES Causes of sinusitis include:  Allergies.  Structural abnormalities, such as displacement of the cartilage that separates your nostrils (deviated septum), which can decrease the air flow through your nose and sinuses and affect sinus drainage.  Functional abnormalities,  such as when the small hairs (cilia) that line your sinuses and help remove mucus do not work properly or are not present. SIGNS AND SYMPTOMS Symptoms of acute and chronic sinusitis are the same. The primary symptoms are pain and pressure around the affected sinuses. Other symptoms include:  Upper toothache.  Earache.  Headache.  Bad  breath.  Decreased sense of smell and taste.  A cough, which worsens when you are lying flat.  Fatigue.  Fever.  Thick drainage from your nose, which often is green and may contain pus (purulent).  Swelling and warmth over the affected sinuses. DIAGNOSIS Your health care provider will perform a physical exam. During your exam, your health care provider may perform any of the following to help determine if you have acute sinusitis or chronic sinusitis:  Look in your nose for signs of abnormal growths in your nostrils (nasal polyps).  Tap over the affected sinus to check for signs of infection.  View the inside of your sinuses using an imaging device that has a light attached (endoscope). If your health care provider suspects that you have chronic sinusitis, one or more of the following tests may be recommended:  Allergy tests.  Nasal culture. A sample of mucus is taken from your nose, sent to a lab, and screened for bacteria.  Nasal cytology. A sample of mucus is taken from your nose and examined by your health care provider to determine if your sinusitis is related to an allergy. TREATMENT Most cases of acute sinusitis are related to a viral infection and will resolve on their own within 10 days. Sometimes, medicines are prescribed to help relieve symptoms of both acute and chronic sinusitis. These may include pain medicines, decongestants, nasal steroid sprays, or saline sprays. However, for sinusitis related to a bacterial infection, your health care provider will prescribe antibiotic medicines. These are medicines that will help kill the bacteria causing the infection. Rarely, sinusitis is caused by a fungal infection. In these cases, your health care provider will prescribe antifungal medicine. For some cases of chronic sinusitis, surgery is needed. Generally, these are cases in which sinusitis recurs more than 3 times per year, despite other treatments. HOME CARE  INSTRUCTIONS  Drink plenty of water. Water helps thin the mucus so your sinuses can drain more easily.  Use a humidifier.  Inhale steam 3-4 times a day (for example, sit in the bathroom with the shower running).  Apply a warm, moist washcloth to your face 3-4 times a day, or as directed by your health care provider.  Use saline nasal sprays to help moisten and clean your sinuses.  Take medicines only as directed by your health care provider.  If you were prescribed either an antibiotic or antifungal medicine, finish it all even if you start to feel better. SEEK IMMEDIATE MEDICAL CARE IF:  You have increasing pain or severe headaches.  You have nausea, vomiting, or drowsiness.  You have swelling around your face.  You have vision problems.  You have a stiff neck.  You have difficulty breathing.   This information is not intended to replace advice given to you by your health care provider. Make sure you discuss any questions you have with your health care provider.   Document Released: 04/13/2005 Document Revised: 05/04/2014 Document Reviewed: 04/28/2011 Elsevier Interactive Patient Education [employer].

## 2015-10-18 NOTE — ED Notes (Addendum)
Patient complains of a intermittent cough for the last three weeks. Patient reports that cough is mainly at nighttime. Patient states that she has been coughing so hard it will cause her to vomit. Patient states that she has tried cetrizine without relief. Patient also reports some abdominal pain that has been constant for a long time- patient states that she takes Rantidine at times to help with this.

## 2015-12-04 ENCOUNTER — Emergency Department
Admission: EM | Admit: 2015-12-04 | Discharge: 2015-12-04 | Disposition: A | Discharge status: Home / Self Care | Payer: BLUE CROSS/BLUE SHIELD | Attending: Emergency Medicine | Admitting: Emergency Medicine

## 2015-12-04 DIAGNOSIS — Z79899 Other long term (current) drug therapy: Secondary | ICD-10-CM | POA: Diagnosis not present

## 2015-12-04 DIAGNOSIS — R531 Weakness: Secondary | ICD-10-CM | POA: Diagnosis not present

## 2015-12-04 DIAGNOSIS — E538 Deficiency of other specified B group vitamins: Secondary | ICD-10-CM | POA: Insufficient documentation

## 2015-12-04 DIAGNOSIS — D500 Iron deficiency anemia secondary to blood loss (chronic): Secondary | ICD-10-CM | POA: Insufficient documentation

## 2015-12-04 DIAGNOSIS — E559 Vitamin D deficiency, unspecified: Secondary | ICD-10-CM | POA: Insufficient documentation

## 2015-12-04 LAB — CBC
HCT: 32.7 % — ABNORMAL LOW (ref 35.0–47.0)
Hemoglobin: 10.9 g/dL — ABNORMAL LOW (ref 12.0–16.0)
MCH: 26.3 pg (ref 26.0–34.0)
MCHC: 33.4 g/dL (ref 32.0–36.0)
MCV: 78.6 fL — AB (ref 80.0–100.0)
PLATELETS: 238 10*3/uL (ref 150–440)
RBC: 4.16 MIL/uL (ref 3.80–5.20)
RDW: 17.6 % — ABNORMAL HIGH (ref 11.5–14.5)
WBC: 6.3 10*3/uL (ref 3.6–11.0)

## 2015-12-04 LAB — URINALYSIS COMPLETE WITH MICROSCOPIC (ARMC ONLY)
Bacteria, UA: NONE SEEN
Bilirubin Urine: NEGATIVE
Glucose, UA: NEGATIVE mg/dL
HGB URINE DIPSTICK: NEGATIVE
KETONES UR: NEGATIVE mg/dL
Nitrite: NEGATIVE
PH: 8 (ref 5.0–8.0)
PROTEIN: NEGATIVE mg/dL
RBC / HPF: NONE SEEN RBC/hpf (ref 0–5)
SPECIFIC GRAVITY, URINE: 1.004 — AB (ref 1.005–1.030)

## 2015-12-04 LAB — BASIC METABOLIC PANEL
Anion gap: 6 (ref 5–15)
BUN: 13 mg/dL (ref 6–20)
CALCIUM: 9 mg/dL (ref 8.9–10.3)
CHLORIDE: 109 mmol/L (ref 101–111)
CO2: 24 mmol/L (ref 22–32)
CREATININE: 0.63 mg/dL (ref 0.44–1.00)
GFR calc non Af Amer: >60 mL/min (ref >60)
Glucose, Bld: 114 mg/dL — ABNORMAL HIGH (ref 65–99)
Potassium: 3.8 mmol/L (ref 3.5–5.1)
SODIUM: 139 mmol/L (ref 135–145)

## 2015-12-04 LAB — POCT PREGNANCY, URINE: Preg Test, Ur: NEGATIVE

## 2015-12-04 NOTE — ED Triage Notes (Addendum)
Pt arrives to ER via POV c/o weakness upon standing with fatigue. Pt had implanon placed in arm X 10 weeks ago and has experienced vaginal bleeding for the entire time up until 2 days ago. Pt was started on an oral contraceptive at well to "stop bleeding" per husband. Pt hemoglobin has been checked X 2 since bleeding has begun and was "9" per husband. Pt weak upon standing since Monday. Pt alert and oriented X4, active, cooperative, pt in NAD. RR even and unlabored, color WNL.  Pt reports mild dizziness. When questioned if patient appeared pale to husband, he denies.

## 2015-12-04 NOTE — ED Provider Notes (Signed)
[facility] Emergency Department Provider Note  Time seen: 2:50 PM  I have reviewed the triage vital signs and the nursing notes.   HISTORY  Chief Complaint Weakness    HPI Lisa Walker is a 30 y.o. female with no past medical history who presents the emergency department for lower extremity weakness. According to the patient and her husband for the past several weeks the patient has had vaginal bleeding since having Implanon implanted. She was placed on birth control tablets, and the bleeding stopped 2 days ago. Husband states the blood level trended down from a 10 to 9. He has noted of the past several days that the patient has been experiencing lower extremity weakness which she describes as slumping while walking. Patient denies any numbness. Patient states some lower back pain. No urinary complaints. Patient states her vaginal bleeding has stopped.Denies headache. Denies fever.  Past Medical History:  Diagnosis Date  . Medical history non-contributory   . PONV (postoperative nausea and vomiting)     Patient Active Problem List   Diagnosis Date Noted  . Indication for care in labor or delivery 09/03/2014  . S/P cesarean section 09/03/2014  . Supervision of normal pregnancy 09/02/2014  . H/O cesarean section complicating pregnancy [id number]    Past Surgical History:  Procedure Laterality Date  . CESAREAN SECTION N/A 2011   performed in [place]  . CESAREAN SECTION N/A 09/03/2014   Procedure: repeat CESAREAN SECTION;  Surgeon: Aletha Halim, MD;  Location: [facility] ORS;  Service: Obstetrics;  Laterality: N/A;    Prior to Admission medications   Medication Sig Start Date End Date Taking? Authorizing Provider  amoxicillin (AMOXIL) 875 MG tablet Take 1 tablet (875 mg total) by mouth 2 (two) times daily. 10/18/15   Norval Gable, MD  guaiFENesin-codeine 100-10 MG/5ML syrup 5-10 ml po qhs prn cough 10/18/15   Norval Gable, MD  ibuprofen (ADVIL,MOTRIN) 800 MG  tablet Take 1 tablet (800 mg total) by mouth every 8 (eight) hours. 09/06/14   Courtney Subudhi, CNM  loratadine (CLARITIN) 10 MG tablet Take 1 tablet (10 mg total) by mouth daily. 09/06/14   Louisa Second, CNM  oxyCODONE-acetaminophen (PERCOCET/ROXICET) 5-325 MG per tablet Take 1-2 tablets by mouth every 4 (four) hours as needed for moderate pain (for pain scale 4-7). 09/06/14   Courtney Subudhi, CNM  pantoprazole (PROTONIX) 40 MG tablet Take 1 tablet (40 mg total) by mouth 2 (two) times daily. 09/06/14   Louisa Second, CNM    Allergies  Allergen Reactions  . Pineapple     Family History  Problem Relation Age of Onset  . Diabetes Father     Social History Social History  Substance Use Topics  . Smoking status: Never Smoker  . Smokeless tobacco: Never Used  . Alcohol use No    Review of Systems Constitutional: Negative for fever. Cardiovascular: Negative for chest pain. Respiratory: Negative for shortness of breath. Gastrointestinal: Negative for abdominal pain Genitourinary: Negative for dysuria. Musculoskeletal: Mild lower back pain. Neurological: Negative for headache 10-point ROS otherwise negative.  ____________________________________________   PHYSICAL EXAM:  VITAL SIGNS: ED Triage Vitals [12/04/15 1408]  Enc Vitals Group     BP 103/61     Pulse Rate 65     Resp 18     Temp 98.3 F (36.8 C)     Temp Source Oral     SpO2 99 %     Weight 158 lb (71.7 kg)     Height  Head Circumference      Peak Flow      Pain Score      Pain Loc      Pain Edu?      Excl. in [place]?     Constitutional: Alert and oriented. Well appearing and in no distress. Eyes: Normal exam ENT   Head: Normocephalic and atraumatic.   Mouth/Throat: Mucous membranes are moist. Cardiovascular: Normal rate, regular rhythm. No murmur Respiratory: Normal respiratory effort without tachypnea nor retractions. Breath sounds are clear  Gastrointestinal: Soft and nontender. No  distention.   Musculoskeletal: Nontender with normal range of motion in all extremities. Neurologic:  Normal speech and language. No gross focal neurologic deficits are appreciated. Equal grip strengths bilaterally. 5/5 motor in all extremities. No pronator drift. No lower extremity drift. Patient is able to ambulate however during ambulation she keeps her knees bent. When asked to stand up straight she is able to stand up straight.  When asked to raise her knee to my hand which is above her waist, she is able to do so. When asked to bend over and touch her toes she is able to do so with no ataxia. Skin:  Skin is warm, dry and intact.  Psychiatric: Mood and affect are normal. Speech and behavior are normal.   ____________________________________________    EKG  EKG reviewed and interpreted by myself shows normal sinus rhythm at 67 bpm, narrow QRS, normal axis, normal intervals, no ST changes. Normal EKG.  INITIAL IMPRESSION / ASSESSMENT AND PLAN / ED COURSE  Pertinent labs & imaging results that were available during my care of the patient were reviewed by me and considered in my medical decision making (see chart for details).  Patient presents with lower extremity weakness only during ambulation. Overall the patient appears well with a normal neurologic exam, however when she ambulates she keeps her knees bent, when asked to specifically stand up straight, she is able to do so. She states she is not aware that her knees are bent his do not give out on her, she is able to maintain her muscle strength to keep her knees bent and to ambulate. Patient has good muscle tone. Good muscle strength in all extremities. It is unclear what is causing the patient's lower extremity weakness during ambulation only. Patient appears to have good core strength. No ataxia.  Labs are within normal limits. I have referred the patient to Dr. Gurney Maxin for further  evaluation.  ____________________________________________   FINAL CLINICAL IMPRESSION(S) / ED DIAGNOSES  Lower extremity weakness    Harvest Dark, MD 12/04/15 [phone]

## 2015-12-04 NOTE — ED Notes (Signed)
MD at bedside. 

## 2015-12-04 NOTE — ED Notes (Signed)
Reports recent heavy bleeding after new bc.  States stopped bleeding 2 days ago but today started feeling weak.  States when standing her knees "couldnt hold her up well".  Skin w/d and slightly pale. MAE, PERRL.

## 2015-12-12 ENCOUNTER — Encounter: Payer: Self-pay | Admitting: Physical Therapy

## 2015-12-12 ENCOUNTER — Ambulatory Visit: Payer: BLUE CROSS/BLUE SHIELD | Attending: Neurology | Admitting: Physical Therapy

## 2015-12-12 DIAGNOSIS — R2681 Unsteadiness on feet: Secondary | ICD-10-CM | POA: Diagnosis present

## 2015-12-12 DIAGNOSIS — M79604 Pain in right leg: Secondary | ICD-10-CM | POA: Diagnosis present

## 2015-12-12 DIAGNOSIS — R29898 Other symptoms and signs involving the musculoskeletal system: Secondary | ICD-10-CM | POA: Insufficient documentation

## 2015-12-12 DIAGNOSIS — M6281 Muscle weakness (generalized): Secondary | ICD-10-CM | POA: Diagnosis present

## 2015-12-12 DIAGNOSIS — R269 Unspecified abnormalities of gait and mobility: Secondary | ICD-10-CM | POA: Diagnosis present

## 2015-12-12 DIAGNOSIS — R262 Difficulty in walking, not elsewhere classified: Secondary | ICD-10-CM | POA: Insufficient documentation

## 2015-12-12 DIAGNOSIS — M79605 Pain in left leg: Secondary | ICD-10-CM | POA: Diagnosis present

## 2015-12-12 NOTE — Therapy (Signed)
[facility] [address] [place], [place], [address] Phone: [phone]   Fax:  [phone]  Physical Therapy Evaluation  Patient Details  Name: Lisa Walker MRN: [id number] Date of Birth: 1986-03-20 Referring Provider: Gurney Maxin MD  Encounter Date: 12/12/2015      PT End of Session - 12/12/15 0820    Visit Number 1   Number of Visits 8   Date for PT Re-Evaluation 01/09/16   PT Start Time 0810   PT Stop Time 0901   PT Time Calculation (min) 51 min   Equipment Utilized During Treatment Gait belt   Activity Tolerance Patient tolerated treatment well;Patient limited by pain;Patient limited by fatigue   Behavior During Therapy [facility] for tasks assessed/performed      Past Medical History:  Diagnosis Date  . Medical history non-contributory   . PONV (postoperative nausea and vomiting)     Past Surgical History:  Procedure Laterality Date  . CESAREAN SECTION N/A 2011   performed in [place]  . CESAREAN SECTION N/A 09/03/2014   Procedure: repeat CESAREAN SECTION;  Surgeon: Aletha Halim, MD;  Location: [facility] ORS;  Service: Obstetrics;  Laterality: N/A;    There were no vitals filed for this visit.       Subjective Assessment - 12/12/15 0815    Subjective Pt. reports B quad/ hip/ L shoulder/ back pain currently at rest (6.5/10).  Pt. reports she has no energy in her legs to move/ walk.  Sudden onset of symptoms.     Pertinent History pt. reports sudden loss of ability to walk but husband states she is able to walk with CGA into PT clinic.  Pt. reports B quad/ hip pain currently at rest.     Limitations Standing;Walking;House hold activities   How long can you stand comfortably? 5 min   How long can you walk comfortably? 5 min (husband assist since 8/16)   Patient Stated Goals increase B LE muscle strength/ decrease LE pain/ improve gait   Currently in Pain? Yes   Pain Score 7    Pain Location Back   Multiple Pain Sites Yes    Pain Score 7   Pain Location Leg   Pain Orientation Right;Left   Pain Type Chronic pain        Objective:  Therapeutic Exercise: Standing balance including: static standing 2 minutes; tandem stance 1 minute, eyes closed static balance 10 seconds. Review of HEP program with husband including: SLR, bridge, standing hip abd/ext, sit<>stand with unilateral UE support, LAQ. Ambulation with RW approx 124ft - pt demonstrates improved gait strategy with more consistent L knee flexion during gait cycle.  See HEP.   Pt response for medical necessity: Pt with decreased exercise tolerance this session secondary to early onset of fatigue and moderate-severe pain in LE/hip and back.        PT Education - 12/12/15 0834    Education provided Yes   Education Details See HEP (seated/standing ex.)   Person(s) Educated Patient;Spouse   Methods Explanation;Demonstration;Tactile cues;Handout   Comprehension Verbalized understanding;Returned demonstration;Verbal cues required             PT Long Term Goals - 12/12/15 1612      PT LONG TERM GOAL #1   Title Pt will score >40/80 on LEFS to promote increase in functional mobility   Baseline 8/17: 26/80   Time 4   Period Weeks   Status New     PT LONG TERM GOAL #2  Title Pt will score >40/56 on BERG to promote increase in amb/standing stability and safety   Baseline 8/17: 32/56   Time 4   Period Weeks   Status New     PT LONG TERM GOAL #3   Title Pt will tolerate >20 minutes of continued standing exercise to promote return to PLOF   Baseline pt able to tolerate <5 minutes currently before requiring seated rest break secondary to weakness and onset of pain   Time 4   Period Weeks   Status New     PT LONG TERM GOAL #4   Title Pt will perform sit<>stand with no UE support with <1/10 pain in LE to promote return of functional mobility   Baseline Pt unable to perform sit<>stand without UE assist; tendency for decreased speed/power   Time 4    Period Weeks   Status New             Plan - 12/12/15 0820    Clinical Impression Statement Pt. is a pleasant 30 y/o female with sudden onset of gait difficulty/ reported leg weakness (11/26/15).  Pt. is assisted to PT by husband due to balance issues/ gait difficulty (HHA in hallway).  B UE/ LE AROM WNL (pain focused with L shoulder MMT and certain movement patterns).  B UE muscle strength grossly 4+/5 MMT except triceps 3-/5 MMT/ sh. flexion 5/5 MMT.  Grip strength: R 35.7#, L 19.5#.  B LE muscle strength grossly 4/5 except knee flexion 4+/5, DF/PF 3+ bilaterally (pain limited) MMT (testing limited by pain/ fear of pain).  Pt. ambulates with slow, R antalgic gait pattern with decrease step length, esp. on L LE.  B knees remain in slight flexed position with moderate muscle fasciculations noted, esp. in R LE.  Pt. requires several short standing rest breaks to ambulate 20 feet.  LEFS: 26/80, Berg balance test: 32/56.  Pt. will benefit from skilled PT services to increase B LE muscle strength to improve independence with gait and safety to promote return to PLOF.           Rehab Potential Good   PT Frequency 2x / week   PT Duration 4 weeks   PT Treatment/Interventions ADLs/Self Care Home Management;Gait training;Stair training;Functional mobility training;Therapeutic activities;Therapeutic exercise;Balance training;Patient/family education;Neuromuscular re-education;Manual techniques;Passive range of motion;Energy conservation   PT Next Visit Plan Review HEP/ reassess gait   PT Home Exercise Plan See HEP   Consulted and Agree with Plan of Care Patient;Family member/caregiver      Patient will benefit from skilled therapeutic intervention in order to improve the following deficits and impairments:  Abnormal gait, Improper body mechanics, Pain, Postural dysfunction, Decreased mobility, Decreased coordination, Decreased activity tolerance, Decreased endurance, Decreased range of motion, Decreased  strength, Difficulty walking, Decreased safety awareness, Decreased balance  Visit Diagnosis: Muscle weakness (generalized)  Gait difficulty  Unsteady gait  Pain In Right Leg  Pain In Left Leg     Problem List Patient Active Problem List   Diagnosis Date Noted  . Indication for care in labor or delivery 09/03/2014  . S/P cesarean section 09/03/2014  . Supervision of normal pregnancy 09/02/2014  . H/O cesarean section complicating pregnancy [id number]   Pura Spice, PT, DPT # [place], SPT 12/13/2015, 12:34 PM  [place] [facility] [address] [place], [place], [address] Phone: [phone]   Fax:  [phone]  Name: Lisa Walker MRN: [id number] Date of Birth: 21-Apr-1986

## 2015-12-16 DIAGNOSIS — E559 Vitamin D deficiency, unspecified: Secondary | ICD-10-CM | POA: Insufficient documentation

## 2015-12-16 DIAGNOSIS — E538 Deficiency of other specified B group vitamins: Secondary | ICD-10-CM | POA: Insufficient documentation

## 2015-12-17 ENCOUNTER — Ambulatory Visit: Payer: BLUE CROSS/BLUE SHIELD | Admitting: Physical Therapy

## 2015-12-17 ENCOUNTER — Encounter: Payer: Self-pay | Admitting: Physical Therapy

## 2015-12-17 ENCOUNTER — Encounter: Payer: Self-pay | Admitting: Psychiatry

## 2015-12-17 ENCOUNTER — Ambulatory Visit (INDEPENDENT_AMBULATORY_CARE_PROVIDER_SITE_OTHER): Payer: BLUE CROSS/BLUE SHIELD | Admitting: Psychiatry

## 2015-12-17 DIAGNOSIS — R269 Unspecified abnormalities of gait and mobility: Secondary | ICD-10-CM

## 2015-12-17 DIAGNOSIS — R2681 Unsteadiness on feet: Secondary | ICD-10-CM

## 2015-12-17 DIAGNOSIS — F444 Conversion disorder with motor symptom or deficit: Secondary | ICD-10-CM | POA: Diagnosis not present

## 2015-12-17 DIAGNOSIS — M6281 Muscle weakness (generalized): Secondary | ICD-10-CM | POA: Diagnosis not present

## 2015-12-17 DIAGNOSIS — M79605 Pain in left leg: Secondary | ICD-10-CM

## 2015-12-17 DIAGNOSIS — M79604 Pain in right leg: Secondary | ICD-10-CM

## 2015-12-17 MED ORDER — DIAZEPAM 2 MG PO TABS: 2.0000 mg | ORAL_TABLET | Freq: Every evening | ORAL | 0 refills | Status: DC | PRN

## 2015-12-17 NOTE — Therapy (Signed)
[facility] [address] [place], [place], [address] Phone: [phone]   Fax:  [phone]  Physical Therapy Treatment  Patient Details  Name: Lisa Walker MRN: [id number] Date of Birth: Sep 02, 1985 Referring Provider: Gurney Maxin MD  Encounter Date: 12/17/2015      PT End of Session - 12/17/15 1659    Visit Number 2   Number of Visits 8   Date for PT Re-Evaluation 01/09/16   PT Start Time [id number]   PT Stop Time 1706   PT Time Calculation (min) 51 min   Equipment Utilized During Treatment Gait belt   Activity Tolerance Patient tolerated treatment well;Patient limited by fatigue   Behavior During Therapy [facility] for tasks assessed/performed      Past Medical History:  Diagnosis Date  . Medical history non-contributory   . PONV (postoperative nausea and vomiting)     Past Surgical History:  Procedure Laterality Date  . CESAREAN SECTION N/A 2011   performed in [place]  . CESAREAN SECTION N/A 09/03/2014   Procedure: repeat CESAREAN SECTION;  Surgeon: Aletha Halim, MD;  Location: [facility] ORS;  Service: Obstetrics;  Laterality: N/A;    There were no vitals filed for this visit.      Subjective Assessment - 12/17/15 1644    Subjective Pt reports she has been compliant with HEP. Feels that she is walking better overall; resistant to idea of purchasing RW/rollator at this time due to increase in functional mobility. Pt states she has increased leg pain with SLR and back pain with bridge.   Pertinent History pt. reports sudden loss of ability to walk but husband states she is able to walk with CGA into PT clinic.  Pt. reports B quad/ hip pain currently at rest.     Limitations Standing;Walking;House hold activities   How long can you stand comfortably? 5 min   How long can you walk comfortably? 5 min (husband assist since 8/16)   Patient Stated Goals increase B LE muscle strength/ decrease LE pain/ improve gait   Currently in Pain?  No/denies      Objective:  Therapeutic Exercise: NuStep 10 minutes, Level 1 (warm up/no charge). Ambulation with rolator to improve standing tolerance/endurance with incorporation of BLE marching to promote knee/hip flexion during gait. In // bars: R/L hip flexion to 90 deg x30. Standing hip ext reciprocal LE x30 with emphasis on eliminating tendency for lumbar flexion. Step ups on 6'' step x30; pt demonstrates difficulty/knee buckling leading with LLE/normalized step pattern leading with RLE. Step downs on 6'' step x30. Descend/climb 4 6'' stairs x10 with UE prn (mostly completed without UE support/SPT CGA for safety) with normalized gait strategy, full knee flexion of bilateral LE with no episodes of buckling/instability.  Pt response for medical necessity: Pt with improved tolerance to activity this session; able to ambulate for >4 minutes with rollator prior to taking seated rest break. Pt with difficulty performing tasks leading with LLE but demonstrates no deficits with L stance. Pt demonstrates improved reciprocal gait strategy when climbing set of stairs but not transferable to ambulation pattern in clinic at this time. Will continue to benefit from skilled PT to promote full return of functional mobility.       PT Education - 12/17/15 1822    Education provided Yes   Education Details see patient instructions: heel slides   Person(s) Educated Patient;Spouse   Methods Explanation;Demonstration;Handout   Comprehension Verbalized understanding;Returned demonstration  PT Long Term Goals - 12/12/15 1612      PT LONG TERM GOAL #1   Title Pt will score >40/80 on LEFS to promote increase in functional mobility   Baseline 8/17: 26/80   Time 4   Period Weeks   Status New     PT LONG TERM GOAL #2   Title Pt will score >40/56 on BERG to promote increase in amb/standing stability and safety   Baseline 8/17: 32/56   Time 4   Period Weeks   Status New     PT LONG TERM  GOAL #3   Title Pt will tolerate >20 minutes of continued standing exercise to promote return to PLOF   Baseline pt able to tolerate <5 minutes currently before requiring seated rest break secondary to weakness and onset of pain   Time 4   Period Weeks   Status New     PT LONG TERM GOAL #4   Title Pt will perform sit<>stand with no UE support with <1/10 pain in LE to promote return of functional mobility   Baseline Pt unable to perform sit<>stand without UE assist; tendency for decreased speed/power   Time 4   Period Weeks   Status New            Plan - 12/17/15 1823    Clinical Impression Statement Pt demonstrates improved walking mechanics this session but still with tendency to ambulate with LLE extended/minimal knee flexion. She is able to tolerate increased exercise intensity this session but requires seated rest breaks secondary to early onset of fatigue. Pt reports difficulty with stairs; demonstrates moderate difficulty with LLE tasks/step ups leading with LLE but no limitations with L stance. She is able to climb/descend 4 6'' steps x10 with no LOB/buckling episodes and functional knee flexion bilaterally.   Rehab Potential Good   PT Frequency 2x / week   PT Duration 4 weeks   PT Treatment/Interventions ADLs/Self Care Home Management;Gait training;Stair training;Functional mobility training;Therapeutic activities;Therapeutic exercise;Balance training;Patient/family education;Neuromuscular re-education;Manual techniques;Passive range of motion;Energy conservation   PT Next Visit Plan Progress HEP; functional mobility   PT Home Exercise Plan See HEP   Consulted and Agree with Plan of Care Patient;Family member/caregiver      Patient will benefit from skilled therapeutic intervention in order to improve the following deficits and impairments:  Abnormal gait, Improper body mechanics, Pain, Postural dysfunction, Decreased mobility, Decreased coordination, Decreased activity  tolerance, Decreased endurance, Decreased range of motion, Decreased strength, Difficulty walking, Decreased safety awareness, Decreased balance  Visit Diagnosis: Muscle weakness (generalized)  Gait difficulty  Unsteady gait  Pain In Right Leg  Pain In Left Leg     Problem List Patient Active Problem List   Diagnosis Date Noted  . B12 deficiency 12/16/2015  . Vitamin D deficiency 12/16/2015  . Difficulty walking 12/12/2015  . Weakness of lower extremity 12/12/2015  . Iron deficiency anemia due to chronic blood loss 12/04/2015  . Vitamin B 12 deficiency 12/04/2015  . Vitamin D insufficiency 12/04/2015  . Indication for care in labor or delivery 09/03/2014  . S/P cesarean section 09/03/2014  . Supervision of normal pregnancy 09/02/2014  . H/O cesarean section complicating pregnancy [id number]   Pura Spice, PT, DPT # [id number] Mickel Baas Jaramie Bastos SPT 12/17/2015, 6:26 PM   [facility] [address] [place], [place], [address] Phone: [phone]   Fax:  [phone]  Name: Lisa Walker MRN: [id number] Date of Birth: 05-20-1985

## 2015-12-17 NOTE — Progress Notes (Signed)
Psychiatric Initial Adult Assessment   Patient Identification: Lisa Walker MRN:  [id number] Date of Evaluation:  12/17/2015 Referral Source: Gurney Maxin, M.D  Chief Complaint:   Chief Complaint    Establish Care     Visit Diagnosis:    ICD-9-CM ICD-10-CM   1. Conversion disorder with abnormal movement, persistent, with psychological stressor 300.11 F44.4     History of Present Illness:    Patient is a 30 year old married female who is originally from [place] presented for initial assessment. She was accompanied by her husband and 2 young children ages 58 years old daughter and 36-year-old son. Patient reported that she has been living in [facility] for the past 4 years. Patient reported that she started having severe pain and weakness in her back for the past 2 weeks. She woke up one morning to give [facility] to her son and felt weakness and severe pain in her back. She was unable to walk. At that time she was taken  to the emergency room and was evaluated. He was not diagnosed with any medical condition and was referred for neurological evaluation. She was diagnosed with conversion disorder and was referred for psychiatric evaluation and physical therapy. Patient is going to start physical therapy this afternoon.  Patient was evaluated by herself. She reported that she has been married for the past 6 years and has how good relationship with her husband. She reported that she has some ups and downs but it is going well. She reported that her daughter is going to start her first grade next week. They have recently relocated to [facility] due to the school so on. She reported that sometimes she feels angry and agitated and loose temper. I were she is able to control her mood symptoms by herself. Her husband also loose temper at home. They will have a verbal altercation but not to the extent that they will hurt each other.  Patient stated that she has been feeling depressed and anxious  since she had an abortion in December. She was 3 months pregnant at that time. She reported that it was a mutual decision. However she feels that it was more by her husband and his family as he supports his family in [place] and her in-laws were concerned about decrease in the amount of money they are getting on a monthly basis from her husband. She reported that her mother was supportive of her being pregnant but her son was only 77 months old at that time. She agreed with her husband and went ahead with her abortion. Patient reported that after the abortion she became very weak and had increased amount of bleeding. She was getting iron pills but it caused her to have GI upset. She stopped the pills recently. Her hemoglobin is still low at 10.9. Patient reported that she has Implanon implanted and her husband is concerned as he thinks that she might be having on her problems related to the Implanon. Patient reported that she does not want to have anymore children at this time. She continues to have lower extremity weakness and when she is trying to walk she feels slumping and bukling of her knees especially on the left side.  Patient currently denied having any suicidal homicidal ideations or plans. She reported that she had good relationship with her husband. She denied having any perceptual disturbances. She takes care of her children and enjoys times with her family members.   Associated Signs/Symptoms: Depression Symptoms:  fatigue, anxiety, (Hypo) Manic  Symptoms:  denied Anxiety Symptoms:  Excessive Worry, Psychotic Symptoms:  denied PTSD Symptoms: Negative NA  Past Psychiatric History:   Patient reported that she has never seen a psychiatrist in the past. She has never been admitted to a psychiatric hospital and has not tried to hurt herself.  Previous Psychotropic Medications: Diazepam 0.25 mg taken while she was young in [place].  Substance Abuse History in the last 12 months:   No.  Consequences of Substance Abuse: Negative NA  Past Medical History:  Past Medical History:  Diagnosis Date  . Medical history non-contributory   . PONV (postoperative nausea and vomiting)     Past Surgical History:  Procedure Laterality Date  . CESAREAN SECTION N/A 2011   performed in [place]  . CESAREAN SECTION N/A 09/03/2014   Procedure: repeat CESAREAN SECTION;  Surgeon: Aletha Halim, MD;  Location: [facility] ORS;  Service: Obstetrics;  Laterality: N/A;    Family Psychiatric History:  Pt  denied any history of depression or anxiety in her family members.  Family History:  Family History  Problem Relation Age of Onset  . Diabetes Father     Social History:   Social History   Social History  . Marital status: Married    Spouse name: N/A  . Number of children: N/A  . Years of education: N/A   Social History Main Topics  . Smoking status: Never Smoker  . Smokeless tobacco: Never Used  . Alcohol use No  . Drug use: No  . Sexual activity: Yes    Birth control/ protection: None   Other Topics Concern  . None   Social History Narrative  . None    Additional Social History:  Pt is originally from [place]. She has moved to [facility] initially 6 years ago after she got married. She has moved to [place] 4 years ago. Now she lives in [place]. Married 6 years and has 2 children ages 19 years old and 56 year old.  Allergies:   Allergies  Allergen Reactions  . Pineapple     Metabolic Disorder Labs: No results found for: HGBA1C, MPG No results found for: PROLACTIN No results found for: CHOL, TRIG, HDL, CHOLHDL, VLDL, LDLCALC   Current Medications: Current Outpatient Prescriptions  Medication Sig Dispense Refill  . Cyanocobalamin (RA VITAMIN B-12 TR) 1000 MCG TBCR Take by mouth.    . Vitamin D, Ergocalciferol, (DRISDOL) 50000 units CAPS capsule     . diazepam (VALIUM) 2 MG tablet Take 1 tablet (2 mg total) by mouth at bedtime as needed for  anxiety. Take 1/2 - 1 pill at bed time as needed . 30 tablet 0   No current facility-administered medications for this visit.     Neurologic: Headache: No Seizure: No Paresthesias:No  Musculoskeletal: Strength & Muscle Tone: within normal limits Gait & Station: weakness while standing especially in left but able to walk when no one is watching Patient leans: N/A  Psychiatric Specialty Exam: Review of Systems  Constitutional: Positive for malaise/fatigue.  Musculoskeletal: Positive for back pain.  Psychiatric/Behavioral: The patient is nervous/anxious.     Last menstrual period 09/25/2015, unknown if currently breastfeeding.There is no height or weight on file to calculate BMI.  General Appearance: Casual  Eye Contact:  Fair  Speech:  Clear and Coherent  Volume:  Normal  Mood:  Anxious  Affect:  Congruent  Thought Process:  Coherent  Orientation:  Full (Time, Place, and Person)  Thought Content:  WDL  Suicidal Thoughts:  No  Homicidal Thoughts:  No  Memory:  Immediate;   Fair Recent;   Fair Remote;   Fair  Judgement:  Fair  Insight:  Fair  Psychomotor Activity:  Normal  Concentration:  Concentration: Fair and Attention Span: Fair  Recall:  [employer] of Knowledge:Fair  Language: Fair  Akathisia:  No  Handed:  Right  AIMS (if indicated):    Assets:  Communication Skills Desire for Improvement Physical Health Social Support  ADL's:  Intact  Cognition: WNL  Sleep:     Treatment Plan Summary: Medication management   Discussed with patient what the medications. She agreed to a trial of diazepam 1-2 mg at night to help with muscle relaxation. She has taken the medication in the past. She reported that she will also continue with her physical therapy on a regular basis.  Follow-up in 3 weeks or earlier depending on her symptoms.   More than 50% of the time spent in psychoeducation, counseling and coordination of care.    This note was generated in part or  whole with voice recognition software. Voice regonition is usually quite accurate but there are transcription errors that can and very often do occur. I apologize for any typographical errors that were not detected and corrected.    Rainey Pines, MD 8/22/201712:11 PM

## 2015-12-19 ENCOUNTER — Ambulatory Visit: Payer: BLUE CROSS/BLUE SHIELD

## 2015-12-19 ENCOUNTER — Encounter: Payer: BLUE CROSS/BLUE SHIELD | Admitting: Physical Therapy

## 2015-12-19 DIAGNOSIS — M79605 Pain in left leg: Secondary | ICD-10-CM

## 2015-12-19 DIAGNOSIS — R269 Unspecified abnormalities of gait and mobility: Secondary | ICD-10-CM

## 2015-12-19 DIAGNOSIS — M6281 Muscle weakness (generalized): Secondary | ICD-10-CM

## 2015-12-19 DIAGNOSIS — M79604 Pain in right leg: Secondary | ICD-10-CM

## 2015-12-19 DIAGNOSIS — R2681 Unsteadiness on feet: Secondary | ICD-10-CM

## 2015-12-19 NOTE — Therapy (Signed)
[facility] [address]. [place], [place], [address] Phone: [phone]   Fax:  [phone]  Physical Therapy Treatment  Patient Details  Name: Lisa Walker MRN: [id number] Date of Birth: [birth year] Referring Provider: Gurney Maxin MD  Encounter Date: 12/19/2015      PT End of Session - 12/19/15 1755    Visit Number 3   Number of Visits 8   Date for PT Re-Evaluation 01/09/16   PT Start Time [id number]   PT Stop Time 1640   PT Time Calculation (min) 55 min   Equipment Utilized During Treatment Gait belt   Activity Tolerance Patient tolerated treatment well;Patient limited by fatigue;Patient limited by pain   Behavior During Therapy [facility] for tasks assessed/performed      Past Medical History:  Diagnosis Date  . Medical history non-contributory   . PONV (postoperative nausea and vomiting)     Past Surgical History:  Procedure Laterality Date  . CESAREAN SECTION N/A 2011   performed in [place]  . CESAREAN SECTION N/A 09/03/2014   Procedure: repeat CESAREAN SECTION;  Surgeon: Aletha Halim, MD;  Location: [facility] ORS;  Service: Obstetrics;  Laterality: N/A;    There were no vitals filed for this visit.      Subjective Assessment - 12/19/15 1754    Subjective Pt reports that she has been walking more regularly since her last appointment; husband states that she was able to walk around [employer] for 30 minutes with cart. She states she is not having leg pain as much but continues to feel tired especially in left leg.   Pertinent History pt. reports sudden loss of ability to walk but husband states she is able to walk with CGA into PT clinic.  Pt. reports B quad/ hip pain currently at rest.     Limitations Standing;Walking;House hold activities   How long can you stand comfortably? 5 min   How long can you walk comfortably? 5 min (husband assist since 8/16)   Patient Stated Goals increase B LE muscle strength/ decrease LE pain/ improve gait    Currently in Pain? No/denies      Objective:   Ambulation independently and with rollator 567ft (warm up); pt demonstrating consistently improved gait strategy this session with increased clearance, step length and speed.  Therapuetic Exercise:  In// bars: steps (2 3'', 1 6'') with reciprocal gait 51ft x6 before onset of LLE pain; increased knee flexion with fatigue. Tap 6'' step (5 minutes before onset of fatigue; anterior hip pain). 3 way hip in standing RLE/LLE (hip flex/abd/ext) 3x10 BLE. Pt required multiple seated rest breaks when performing with LLE as stance leg. Seated nn blue theraball with SPT min A for ball stability: knee extension 3x10 bilaterally, hip flexion x30 bilaterally, DF/PF x30 bilaterally.  Nu Step: 10 minutes, Level 1 (no charge).   Pt response for medical necessity: Pt requires frequent seated rest breaks during therapy session due to onset of LLE fatigue and pain. She is able to resume exercise each time with shorter duration of endurance each time. Pt demonstrates improved gait mechanics this session with good progress towards goals.        PT Long Term Goals - 12/12/15 1612      PT LONG TERM GOAL #1   Title Pt will score >40/80 on LEFS to promote increase in functional mobility   Baseline 8/17: 26/80   Time 4   Period Weeks   Status New     PT LONG  TERM GOAL #2   Title Pt will score >40/56 on BERG to promote increase in amb/standing stability and safety   Baseline 8/17: 32/56   Time 4   Period Weeks   Status New     PT LONG TERM GOAL #3   Title Pt will tolerate >20 minutes of continued standing exercise to promote return to PLOF   Baseline pt able to tolerate <5 minutes currently before requiring seated rest break secondary to weakness and onset of pain   Time 4   Period Weeks   Status New     PT LONG TERM GOAL #4   Title Pt will perform sit<>stand with no UE support with <1/10 pain in LE to promote return of functional mobility   Baseline Pt  unable to perform sit<>stand without UE assist; tendency for decreased speed/power   Time 4   Period Weeks   Status New               Plan - 12/19/15 1756    Clinical Impression Statement Pt demonstrates overall improved ambulation with comparable gait strategy walking independently versus walking with rollator. She has improved foot clearance and increased knee flexion with swing on LLE. She is ambulating with increased speed and step length this session. Pt able to ascend/descend 4 6'' steps with normalized gait pattern but experiences increased crouched posture (hip/knee flexion) when performing task with 10# to simulate carrying child.    Rehab Potential Good   PT Frequency 2x / week   PT Duration 4 weeks   PT Treatment/Interventions ADLs/Self Care Home Management;Gait training;Stair training;Functional mobility training;Therapeutic activities;Therapeutic exercise;Balance training;Patient/family education;Neuromuscular re-education;Manual techniques;Passive range of motion;Energy conservation   PT Next Visit Plan Progress HEP; functional mobility   PT Home Exercise Plan See HEP   Consulted and Agree with Plan of Care Patient;Family member/caregiver      Patient will benefit from skilled therapeutic intervention in order to improve the following deficits and impairments:  Abnormal gait, Improper body mechanics, Pain, Postural dysfunction, Decreased mobility, Decreased coordination, Decreased activity tolerance, Decreased endurance, Decreased range of motion, Decreased strength, Difficulty walking, Decreased safety awareness, Decreased balance  Visit Diagnosis: Muscle weakness (generalized)  Gait difficulty  Unsteady gait  Pain In Right Leg  Pain In Left Leg     Problem List Patient Active Problem List   Diagnosis Date Noted  . B12 deficiency 12/16/2015  . Vitamin D deficiency 12/16/2015  . Difficulty walking 12/12/2015  . Weakness of lower extremity 12/12/2015  .  Iron deficiency anemia due to chronic blood loss 12/04/2015  . Vitamin B 12 deficiency 12/04/2015  . Vitamin D insufficiency 12/04/2015  . Indication for care in labor or delivery 09/03/2014  . S/P cesarean section 09/03/2014  . Supervision of normal pregnancy 09/02/2014  . H/O cesarean section complicating pregnancy [id number]   This entire session was performed under direct supervision and direction of a licensed Chiropractor . I have personally read, edited and approve of the note as written.   Mickel Baas Travaris Kosh SPT Huprich,Jason DPT 12/20/2015, 11:56 AM  [facility] [address]. [place], [place], [address] Phone: [phone]   Fax:  [phone]  Name: Lisa Walker MRN: [id number] Date of Birth: 1986/03/20

## 2015-12-24 ENCOUNTER — Encounter: Payer: Self-pay | Admitting: Physical Therapy

## 2015-12-24 ENCOUNTER — Ambulatory Visit: Payer: BLUE CROSS/BLUE SHIELD | Admitting: Physical Therapy

## 2015-12-24 DIAGNOSIS — M6281 Muscle weakness (generalized): Secondary | ICD-10-CM

## 2015-12-24 DIAGNOSIS — M79605 Pain in left leg: Secondary | ICD-10-CM

## 2015-12-24 DIAGNOSIS — R269 Unspecified abnormalities of gait and mobility: Secondary | ICD-10-CM

## 2015-12-24 DIAGNOSIS — R2681 Unsteadiness on feet: Secondary | ICD-10-CM

## 2015-12-24 DIAGNOSIS — M79604 Pain in right leg: Secondary | ICD-10-CM

## 2015-12-24 NOTE — Therapy (Signed)
[facility] [address] [place], [place], [address] Phone: [phone]   Fax:  [phone]  Physical Therapy Treatment  Patient Details  Name: Lisa Walker MRN: [id number] Date of Birth: 1985/10/08 Referring Provider: Gurney Maxin MD  Encounter Date: 12/24/2015      PT End of Session - 12/24/15 1836    Visit Number 4   Number of Visits 8   Date for PT Re-Evaluation 01/09/16   PT Start Time 1630   PT Stop Time 1730   PT Time Calculation (min) 60 min   Equipment Utilized During Treatment Gait belt   Activity Tolerance Patient tolerated treatment well;Patient limited by fatigue;Patient limited by pain   Behavior During Therapy [facility] for tasks assessed/performed      Past Medical History:  Diagnosis Date  . Medical history non-contributory   . PONV (postoperative nausea and vomiting)     Past Surgical History:  Procedure Laterality Date  . CESAREAN SECTION N/A 2011   performed in [place]  . CESAREAN SECTION N/A 09/03/2014   Procedure: repeat CESAREAN SECTION;  Surgeon: Aletha Halim, MD;  Location: [facility] ORS;  Service: Obstetrics;  Laterality: N/A;    There were no vitals filed for this visit.      Subjective Assessment - 12/24/15 1833    Subjective Pt arrives to PT session stating that she is feeling good. Reports that she feels she is doing 100% of regular house work but continues to feel that her overall endurance is limited. She experiences "getting stuck" especially when getting out of bed in the middle of the night; and is unable to move for several seconds.    Pertinent History pt. reports sudden loss of ability to walk but husband states she is able to walk with CGA into PT clinic.  Pt. reports B quad/ hip pain currently at rest.     Limitations Standing;Walking;House hold activities   How long can you stand comfortably? 5 min   How long can you walk comfortably? 5 min (husband assist since 8/16)   Patient Stated Goals  increase B LE muscle strength/ decrease LE pain/ improve gait   Currently in Pain? No/denies      Objective:   NuStep Level 3, 10 minutes (warm up/no charge - pt reporting it "feels easy today" with increased resistance).   Therapeutic Exercise: Climb/descend 6'' steps x4 without UE support. Sit<>stand x10.  Neuromuscular Re-ed: In // bars: performed step ups on air ex pad to promote dynamic balance 57ft x4 forward step, 73ft x4 lateral steps. Static stance on BOSU no UE support 1 minute intervals with standing rest breaks with UE support. Step up/down on BOSU with UE prn x5. Modified grapevine (front cross/lateral step) 78ft x4 R/L. Pt with increased difficulty advancing towards the right requiring a seated rest break secondary to "getting stuck."  Pt response for medical necessity: Pt demonstrates progress with functional gait pattern but continues to regress to old pattern with continued exercise. She demonstrates episodes of functional strength with inconsistent episodes of LE buckling/non compliance. She continues to be limited by inconsistent pain/fatigue all over her body but shows good motivation/determination to perform all exercises.       PT Education - 12/24/15 [place]    Education provided Yes   Education Details HEP: coordination modified grapevine   Person(s) Educated Patient;Spouse   Methods Explanation;Demonstration;Handout   Comprehension Verbalized understanding;Returned demonstration             PT Long Term  Goals - 12/12/15 1612      PT LONG TERM GOAL #1   Title Pt will score >40/80 on LEFS to promote increase in functional mobility   Baseline 8/17: 26/80   Time 4   Period Weeks   Status New     PT LONG TERM GOAL #2   Title Pt will score >40/56 on BERG to promote increase in amb/standing stability and safety   Baseline 8/17: 32/56   Time 4   Period Weeks   Status New     PT LONG TERM GOAL #3   Title Pt will tolerate >20 minutes of continued standing  exercise to promote return to PLOF   Baseline pt able to tolerate <5 minutes currently before requiring seated rest break secondary to weakness and onset of pain   Time 4   Period Weeks   Status New     PT LONG TERM GOAL #4   Title Pt will perform sit<>stand with no UE support with <1/10 pain in LE to promote return of functional mobility   Baseline Pt unable to perform sit<>stand without UE assist; tendency for decreased speed/power   Time 4   Period Weeks   Status New               Plan - 12/24/15 1836    Clinical Impression Statement Pt arrives to PT session with functional gait pattern with good foot clearance/heel strike bilaterally. After she exercises, she experiences regression to intial walking pattern with crouched stance and shuffling of LLE. Pt with overall good tolerance of increased intensity of exercise this therapy session with several episodes of "getting stuck"/being unable to progress through exercise. She experiences this once with stepping off of BOSU and again with modified grapevine actiivty when advancing towards the right. In both instances she is able to resume activity and perform without difficulty.    Rehab Potential Good   PT Frequency 2x / week   PT Duration 4 weeks   PT Treatment/Interventions ADLs/Self Care Home Management;Gait training;Stair training;Functional mobility training;Therapeutic activities;Therapeutic exercise;Balance training;Patient/family education;Neuromuscular re-education;Manual techniques;Passive range of motion;Energy conservation   PT Next Visit Plan Progress HEP; functional mobility   PT Home Exercise Plan See HEP   Consulted and Agree with Plan of Care Patient;Family member/caregiver      Patient will benefit from skilled therapeutic intervention in order to improve the following deficits and impairments:  Abnormal gait, Improper body mechanics, Pain, Postural dysfunction, Decreased mobility, Decreased coordination, Decreased  activity tolerance, Decreased endurance, Decreased range of motion, Decreased strength, Difficulty walking, Decreased safety awareness, Decreased balance  Visit Diagnosis: Muscle weakness (generalized)  Gait difficulty  Unsteady gait  Pain In Right Leg  Pain In Left Leg     Problem List Patient Active Problem List   Diagnosis Date Noted  . B12 deficiency 12/16/2015  . Vitamin D deficiency 12/16/2015  . Difficulty walking 12/12/2015  . Weakness of lower extremity 12/12/2015  . Iron deficiency anemia due to chronic blood loss 12/04/2015  . Vitamin B 12 deficiency 12/04/2015  . Vitamin D insufficiency 12/04/2015  . Indication for care in labor or delivery 09/03/2014  . S/P cesarean section 09/03/2014  . Supervision of normal pregnancy 09/02/2014  . H/O cesarean section complicating pregnancy [id number]   Pura Spice, PT, DPT # [id number] Derrill Memo, SPT 12/25/2015, 3:11 PM  [facility] [address] [place], [place], [address] Phone: [phone]   Fax:  [phone]  Name: [place]  Casado MRN: [id number] Date of Birth: June 11, 1985

## 2015-12-26 ENCOUNTER — Ambulatory Visit: Payer: BLUE CROSS/BLUE SHIELD | Admitting: Physical Therapy

## 2015-12-26 ENCOUNTER — Encounter: Payer: Self-pay | Admitting: Physical Therapy

## 2015-12-26 DIAGNOSIS — M6281 Muscle weakness (generalized): Secondary | ICD-10-CM

## 2015-12-26 DIAGNOSIS — R269 Unspecified abnormalities of gait and mobility: Secondary | ICD-10-CM

## 2015-12-26 DIAGNOSIS — R2681 Unsteadiness on feet: Secondary | ICD-10-CM

## 2015-12-26 DIAGNOSIS — M79604 Pain in right leg: Secondary | ICD-10-CM

## 2015-12-26 DIAGNOSIS — M79605 Pain in left leg: Secondary | ICD-10-CM

## 2015-12-26 NOTE — Therapy (Signed)
[facility] [address] [place], [place], [address] Phone: [phone]   Fax:  [phone]  Physical Therapy Treatment  Patient Details  Name: Lisa Walker MRN: [id number] Date of Birth: [birth year] Referring Provider: Gurney Maxin MD  Encounter Date: 12/26/2015      PT End of Session - 12/26/15 1805    Visit Number 5   Number of Visits 8   Date for PT Re-Evaluation 01/09/16   PT Start Time [id number]   PT Stop Time 1721   PT Time Calculation (min) 50 min   Equipment Utilized During Treatment Gait belt   Activity Tolerance Patient tolerated treatment well;Patient limited by fatigue   Behavior During Therapy [facility] for tasks assessed/performed      Past Medical History:  Diagnosis Date  . Medical history non-contributory   . PONV (postoperative nausea and vomiting)     Past Surgical History:  Procedure Laterality Date  . CESAREAN SECTION N/A 2011   performed in [place]  . CESAREAN SECTION N/A 09/03/2014   Procedure: repeat CESAREAN SECTION;  Surgeon: Aletha Halim, MD;  Location: [facility] ORS;  Service: Obstetrics;  Laterality: N/A;    There were no vitals filed for this visit.      Subjective Assessment - 12/26/15 1803    Subjective Pt arrives to PT session with functional gait pattern with no discernable difficulty and states that she is doing well. Reports that she continues to feel left leg weakness with episodes of pain all over her body but states she is moving better today than she did on Tuesday.    Pertinent History pt. reports sudden loss of ability to walk but husband states she is able to walk with CGA into PT clinic.  Pt. reports B quad/ hip pain currently at rest.     Limitations Standing;Walking;House hold activities   How long can you stand comfortably? 5 min   How long can you walk comfortably? 5 min (husband assist since 8/16)   Patient Stated Goals increase B LE muscle strength/ decrease LE pain/ improve gait   Currently in Pain? No/denies     Objective:   Therapeutic Exercise: Nu Step 10 minutes Level 4 (warm up/no charge). Marching with #5 (4 minutes x2). Lateral steps with #5 (4 minutes). Lateral steps over 4 6'' cones x8.   Neuromuscular Re-ed: Marching on air ex pad (3 minutes). Modified grapevine with separate front/back cross over 21ft x4. Grapevine 57ft x4. SLS on RLE/LLE with anterior/posterior displacement of nonstance leg.  Pt response for medical necessity: Pt experiences LE fatigue and bouts of LLE pain during therapy session and requires several short (<1 minute) seated rest breaks in between tasks. She experiences difficulty with advancement toward L with coordination tasks today with no episodes of "getting stuck" today.       PT Long Term Goals - 12/12/15 1612      PT LONG TERM GOAL #1   Title Pt will score >40/80 on LEFS to promote increase in functional mobility   Baseline 8/17: 26/80   Time 4   Period Weeks   Status New     PT LONG TERM GOAL #2   Title Pt will score >40/56 on BERG to promote increase in amb/standing stability and safety   Baseline 8/17: 32/56   Time 4   Period Weeks   Status New     PT LONG TERM GOAL #3   Title Pt will tolerate >20 minutes of continued standing exercise to  promote return to PLOF   Baseline pt able to tolerate <5 minutes currently before requiring seated rest break secondary to weakness and onset of pain   Time 4   Period Weeks   Status New     PT LONG TERM GOAL #4   Title Pt will perform sit<>stand with no UE support with <1/10 pain in LE to promote return of functional mobility   Baseline Pt unable to perform sit<>stand without UE assist; tendency for decreased speed/power   Time 4   Period Weeks   Status New               Plan - 12/26/15 1806    Clinical Impression Statement Pt demonstrates functional gait pattern at session onset and when leaving clinic. She is able to ambulate with 5# in clinic with normalized  gait pattern and no complaints. She demonstrates more difficulty with L advancement when performing coordination tasks today. Pt with no episodes of "getting stuck" this session with all activities performed inside // bars with no UE support.   Rehab Potential Good   PT Frequency 2x / week   PT Duration 4 weeks   PT Treatment/Interventions ADLs/Self Care Home Management;Gait training;Stair training;Functional mobility training;Therapeutic activities;Therapeutic exercise;Balance training;Patient/family education;Neuromuscular re-education;Manual techniques;Passive range of motion;Energy conservation   PT Next Visit Plan Progress HEP; functional mobility   PT Home Exercise Plan See HEP   Consulted and Agree with Plan of Care Patient;Family member/caregiver   Family Member Consulted Husband      Patient will benefit from skilled therapeutic intervention in order to improve the following deficits and impairments:  Abnormal gait, Improper body mechanics, Pain, Postural dysfunction, Decreased mobility, Decreased coordination, Decreased activity tolerance, Decreased endurance, Decreased range of motion, Decreased strength, Difficulty walking, Decreased safety awareness, Decreased balance  Visit Diagnosis: Muscle weakness (generalized)  Gait difficulty  Unsteady gait  Pain In Right Leg  Pain In Left Leg     Problem List Patient Active Problem List   Diagnosis Date Noted  . B12 deficiency 12/16/2015  . Vitamin D deficiency 12/16/2015  . Difficulty walking 12/12/2015  . Weakness of lower extremity 12/12/2015  . Iron deficiency anemia due to chronic blood loss 12/04/2015  . Vitamin B 12 deficiency 12/04/2015  . Vitamin D insufficiency 12/04/2015  . Indication for care in labor or delivery 09/03/2014  . S/P cesarean section 09/03/2014  . Supervision of normal pregnancy 09/02/2014  . H/O cesarean section complicating pregnancy [id number]   Pura Spice, PT, DPT # [id number] Mickel Baas Rockey Guarino  SPT 12/26/2015, 6:12 PM  [place] [address] [place], [place], [address] Phone: [phone]   Fax:  [phone]  Name: Ezell Ayer MRN: [id number] Date of Birth: [birth year]

## 2015-12-31 ENCOUNTER — Encounter: Payer: Self-pay | Admitting: Physical Therapy

## 2015-12-31 ENCOUNTER — Ambulatory Visit: Payer: BLUE CROSS/BLUE SHIELD | Attending: Neurology | Admitting: Physical Therapy

## 2015-12-31 DIAGNOSIS — M6281 Muscle weakness (generalized): Secondary | ICD-10-CM | POA: Diagnosis present

## 2015-12-31 DIAGNOSIS — M79604 Pain in right leg: Secondary | ICD-10-CM | POA: Diagnosis present

## 2015-12-31 DIAGNOSIS — M79605 Pain in left leg: Secondary | ICD-10-CM | POA: Insufficient documentation

## 2015-12-31 DIAGNOSIS — R269 Unspecified abnormalities of gait and mobility: Secondary | ICD-10-CM | POA: Diagnosis present

## 2015-12-31 DIAGNOSIS — R2681 Unsteadiness on feet: Secondary | ICD-10-CM | POA: Diagnosis present

## 2015-12-31 NOTE — Therapy (Signed)
[facility] [address] [place], [place], [address] Phone: [phone]   Fax:  [phone]  Physical Therapy Treatment  Patient Details  Name: Lisa Walker MRN: [id number] Date of Birth: 1985/07/26 Referring Provider: Gurney Maxin MD  Encounter Date: 12/31/2015      PT End of Session - 12/31/15 1854    Visit Number 6   Number of Visits 8   Date for PT Re-Evaluation 01/09/16   PT Start Time 1624   PT Stop Time 1719   PT Time Calculation (min) 55 min   Equipment Utilized During Treatment Gait belt   Activity Tolerance Patient tolerated treatment well;Patient limited by fatigue;Patient limited by pain   Behavior During Therapy [facility] for tasks assessed/performed      Past Medical History:  Diagnosis Date  . Medical history non-contributory   . PONV (postoperative nausea and vomiting)     Past Surgical History:  Procedure Laterality Date  . CESAREAN SECTION N/A 2011   performed in [place]  . CESAREAN SECTION N/A 09/03/2014   Procedure: repeat CESAREAN SECTION;  Surgeon: Aletha Halim, MD;  Location: [facility] ORS;  Service: Obstetrics;  Laterality: N/A;    There were no vitals filed for this visit.      Subjective Assessment - 12/31/15 1853    Subjective Pt reports that she has been doing her home exercise program regularly and was able to walk 1 mile yesterday on top of her regular home duties. States that she felt fatigued this morning and did take a pain pill before coming to therapy due to leg/knee pain. Pt states she has been having more knee pain over the last week.   Pertinent History pt. reports sudden loss of ability to walk but husband states she is able to walk with CGA into PT clinic.  Pt. reports B quad/ hip pain currently at rest.     Limitations Standing;Walking;House hold activities   How long can you stand comfortably? 5 min   How long can you walk comfortably? 5 min (husband assist since 8/16)   Patient Stated  Goals increase B LE muscle strength/ decrease LE pain/ improve gait   Currently in Pain? No/denies      Objective:  Therapeutic Exercise: Nu Step 15 minutes Level 5 (no charge). Eccentric step down forwards x10 BLE, eccentric step down lateral step x10 BLE.   Neuromuscular Re-ed: Tandem walking forwards/backwards 74ft x5. Pt demonstrates increased step length with repeated practice and improved stability/confidence. In // bars with SPT CGA/SBA: BOSU step ups forwards 2x10 no UE support, sideways 3x10 leading with alternating LE. Pt with increased knee flexion when leading with LLE but able to maintain single leg balance on LLE. Mini squat on BOSU x20 with no UE support.  Pt response for medical necessity: Pt requires seated rest breaks throughout therapy session but demonstrates no LOB or episode of buckling/"getting stuck" during today's session. She demonstrates good LE stability bilaterally despite tendency for increased knee flexion on LLE. Pt with mild knee pain reported secondary to tendency for crouched posture with ambulation.        PT Long Term Goals - 12/12/15 1612      PT LONG TERM GOAL #1   Title Pt will score >40/80 on LEFS to promote increase in functional mobility   Baseline 8/17: 26/80   Time 4   Period Weeks   Status New     PT LONG TERM GOAL #2   Title Pt will score >  40/56 on BERG to promote increase in amb/standing stability and safety   Baseline 8/17: 32/56   Time 4   Period Weeks   Status New     PT LONG TERM GOAL #3   Title Pt will tolerate >20 minutes of continued standing exercise to promote return to PLOF   Baseline pt able to tolerate <5 minutes currently before requiring seated rest break secondary to weakness and onset of pain   Time 4   Period Weeks   Status New     PT LONG TERM GOAL #4   Title Pt will perform sit<>stand with no UE support with <1/10 pain in LE to promote return of functional mobility   Baseline Pt unable to perform sit<>stand  without UE assist; tendency for decreased speed/power   Time 4   Period Weeks   Status New            Plan - 12/31/15 1855    Clinical Impression Statement Pt continues to demonstrate crouched walking pattern especially on LLE (with increased knee flexion during gait) but is able to perform all exercises with no episodes of knee buckling. She performs higher level balance activities with no LOB on either LE and overall demonstrates good stability bilaterally. She experiences anterior knee pain with tasks including deep knee flexion (>90 deg).   Rehab Potential Good   PT Frequency 2x / week   PT Duration 4 weeks   PT Treatment/Interventions ADLs/Self Care Home Management;Gait training;Stair training;Functional mobility training;Therapeutic activities;Therapeutic exercise;Balance training;Patient/family education;Neuromuscular re-education;Manual techniques;Passive range of motion;Energy conservation   PT Next Visit Plan Progress HEP; functional mobility   PT Home Exercise Plan See HEP   Consulted and Agree with Plan of Care Patient;Family member/caregiver   Family Member Consulted Husband      Patient will benefit from skilled therapeutic intervention in order to improve the following deficits and impairments:  Abnormal gait, Improper body mechanics, Pain, Postural dysfunction, Decreased mobility, Decreased coordination, Decreased activity tolerance, Decreased endurance, Decreased range of motion, Decreased strength, Difficulty walking, Decreased safety awareness, Decreased balance  Visit Diagnosis: Muscle weakness (generalized)  Gait difficulty  Unsteady gait  Pain In Right Leg  Pain In Left Leg     Problem List Patient Active Problem List   Diagnosis Date Noted  . B12 deficiency 12/16/2015  . Vitamin D deficiency 12/16/2015  . Difficulty walking 12/12/2015  . Weakness of lower extremity 12/12/2015  . Iron deficiency anemia due to chronic blood loss 12/04/2015  .  Vitamin B 12 deficiency 12/04/2015  . Vitamin D insufficiency 12/04/2015  . Indication for care in labor or delivery 09/03/2014  . S/P cesarean section 09/03/2014  . Supervision of normal pregnancy 09/02/2014  . H/O cesarean section complicating pregnancy [id number]   Pura Spice, PT, DPT # [id number] Mickel Baas Anshika Pethtel SPT 12/31/2015, 7:03 PM  [facility] [address] [place], [place], [address] Phone: [phone]   Fax:  [phone]  Name: Lisa Walker MRN: [id number] Date of Birth: 1986/03/09

## 2016-01-02 ENCOUNTER — Encounter: Payer: Self-pay | Admitting: Physical Therapy

## 2016-01-02 ENCOUNTER — Ambulatory Visit: Payer: BLUE CROSS/BLUE SHIELD | Admitting: Physical Therapy

## 2016-01-02 DIAGNOSIS — M79605 Pain in left leg: Secondary | ICD-10-CM

## 2016-01-02 DIAGNOSIS — M6281 Muscle weakness (generalized): Secondary | ICD-10-CM | POA: Diagnosis not present

## 2016-01-02 DIAGNOSIS — M25511 Pain in right shoulder: Secondary | ICD-10-CM | POA: Insufficient documentation

## 2016-01-02 DIAGNOSIS — M542 Cervicalgia: Secondary | ICD-10-CM | POA: Insufficient documentation

## 2016-01-02 DIAGNOSIS — R2681 Unsteadiness on feet: Secondary | ICD-10-CM

## 2016-01-02 DIAGNOSIS — R269 Unspecified abnormalities of gait and mobility: Secondary | ICD-10-CM

## 2016-01-02 DIAGNOSIS — M79601 Pain in right arm: Secondary | ICD-10-CM | POA: Insufficient documentation

## 2016-01-02 DIAGNOSIS — M79604 Pain in right leg: Secondary | ICD-10-CM

## 2016-01-02 NOTE — Therapy (Signed)
[facility] [address] [place], [place], [address] Phone: [phone]   Fax:  [phone]  Physical Therapy Treatment  Patient Details  Name: Lisa Walker MRN: [id number] Date of Birth: August 17, 1985 Referring Provider: Gurney Maxin MD  Encounter Date: 01/02/2016      PT End of Session - 01/02/16 1833    Visit Number 7   Number of Visits 8   Date for PT Re-Evaluation 01/09/16   PT Start Time [id number]   PT Stop Time 1732   PT Time Calculation (min) 61 min   Equipment Utilized During Treatment Gait belt   Activity Tolerance Patient tolerated treatment well;Patient limited by fatigue   Behavior During Therapy [facility] for tasks assessed/performed      Past Medical History:  Diagnosis Date  . Medical history non-contributory   . PONV (postoperative nausea and vomiting)     Past Surgical History:  Procedure Laterality Date  . CESAREAN SECTION N/A 2011   performed in [place]  . CESAREAN SECTION N/A 09/03/2014   Procedure: repeat CESAREAN SECTION;  Surgeon: Aletha Halim, MD;  Location: [facility] ORS;  Service: Obstetrics;  Laterality: N/A;    There were no vitals filed for this visit.      Subjective Assessment - 01/02/16 1831    Subjective Pt reports that she went to see her MD this morning. States that he is thinking about performing an MRI on her brain but she does not believe it has been scheduled. Pt states she feels tired regularly but is continuing to practice her HEP with regular walking/stair climbing to keep herself active.   Pertinent History pt. reports sudden loss of ability to walk but husband states she is able to walk with CGA into PT clinic.  Pt. reports B quad/ hip pain currently at rest.     Limitations Standing;Walking;House hold activities   How long can you stand comfortably? 5 min   How long can you walk comfortably? 5 min (husband assist since 8/16)   Patient Stated Goals increase B LE muscle strength/ decrease LE  pain/ improve gait   Currently in Pain? No/denies      Objective:  Therapeutic Exercise: Nu Step 10 minutes Level 5 (no charge). Standing marching with 3# weights; pt tolerates 3 minutes. Dynamic balance hip flexion/ext/abd on air ex x20 RLE/LLE with 1.5# ankle weights, marching on air ex x50 #1.5 ankle weights. Heel raises on air ex x30. Treadmill walking 6 minutes .5 mph with cueing for arm swing, foot clearance and treadmill safety; pt requested treadmill practice this session with plans to use treadmill at apartment gym over the weekend due to weather concerns.   Neuromuscular Re-ed: Coordination drills including modified grape vine (front cross/side step and backwards cross/side step) 56ft x4 ea. And grape vine 60ft x4.   Pt response for medical necessity: Pt continues to perform higher level balance tasks with good stability in bilateral LE but demonstrates crouched walking posture regularly. Her crouched walking posture is exacerbated by ambulation on treadmill secondary to fear.         PT Education - 01/02/16 1833    Education provided Yes   Education Details HEP: squat/squat with unstable surface   Person(s) Educated Patient   Methods Explanation;Demonstration;Handout   Comprehension Verbalized understanding;Returned demonstration             PT Long Term Goals - 12/12/15 1612      PT LONG TERM GOAL #1   Title Pt will score >  40/80 on LEFS to promote increase in functional mobility   Baseline 8/17: 26/80   Time 4   Period Weeks   Status New     PT LONG TERM GOAL #2   Title Pt will score >40/56 on BERG to promote increase in amb/standing stability and safety   Baseline 8/17: 32/56   Time 4   Period Weeks   Status New     PT LONG TERM GOAL #3   Title Pt will tolerate >20 minutes of continued standing exercise to promote return to PLOF   Baseline pt able to tolerate <5 minutes currently before requiring seated rest break secondary to weakness and onset of pain    Time 4   Period Weeks   Status New     PT LONG TERM GOAL #4   Title Pt will perform sit<>stand with no UE support with <1/10 pain in LE to promote return of functional mobility   Baseline Pt unable to perform sit<>stand without UE assist; tendency for decreased speed/power   Time 4   Period Weeks   Status New               Plan - 01/02/16 1834    Clinical Impression Statement Pt ambulates with increased knee flexion of LLE into and out of therapy clinic today but she is able to perform higher difficulty balance/strengthening tasks with no LOB or episodes of LE buckling. Pt is fearful of treadmill and ambulates with <.5 mph speed secondary to anxiety. She displays increased crouched stance and UE in mid guard position with treadmill ambulation.   Rehab Potential Good   PT Frequency 2x / week   PT Duration 4 weeks   PT Treatment/Interventions ADLs/Self Care Home Management;Gait training;Stair training;Functional mobility training;Therapeutic activities;Therapeutic exercise;Balance training;Patient/family education;Neuromuscular re-education;Manual techniques;Passive range of motion;Energy conservation   PT Next Visit Plan Reassess goals, D/C or recert.   PT Home Exercise Plan See HEP   Consulted and Agree with Plan of Care Patient;Family member/caregiver   Family Member Consulted Husband      Patient will benefit from skilled therapeutic intervention in order to improve the following deficits and impairments:  Abnormal gait, Improper body mechanics, Pain, Postural dysfunction, Decreased mobility, Decreased coordination, Decreased activity tolerance, Decreased endurance, Decreased range of motion, Decreased strength, Difficulty walking, Decreased safety awareness, Decreased balance  Visit Diagnosis: Muscle weakness (generalized)  Gait difficulty  Unsteady gait  Pain In Right Leg  Pain In Left Leg     Problem List Patient Active Problem List   Diagnosis Date Noted  .  B12 deficiency 12/16/2015  . Vitamin D deficiency 12/16/2015  . Difficulty walking 12/12/2015  . Weakness of lower extremity 12/12/2015  . Iron deficiency anemia due to chronic blood loss 12/04/2015  . Vitamin B 12 deficiency 12/04/2015  . Vitamin D insufficiency 12/04/2015  . Indication for care in labor or delivery 09/03/2014  . S/P cesarean section 09/03/2014  . Supervision of normal pregnancy 09/02/2014  . H/O cesarean section complicating pregnancy [id number]   Pura Spice, PT, DPT # [phone] Mickel Baas Quincy Boy SPT 01/02/2016, 6:42 PM  [place] [facility] [address] [place], [place], [address] Phone: [phone]   Fax:  [phone]  Name: Lisa Walker MRN: [id number] Date of Birth: 12-03-1985

## 2016-01-07 ENCOUNTER — Encounter: Payer: Self-pay | Admitting: Physical Therapy

## 2016-01-07 ENCOUNTER — Ambulatory Visit: Payer: BLUE CROSS/BLUE SHIELD | Admitting: Physical Therapy

## 2016-01-07 DIAGNOSIS — M6281 Muscle weakness (generalized): Secondary | ICD-10-CM

## 2016-01-07 DIAGNOSIS — R269 Unspecified abnormalities of gait and mobility: Secondary | ICD-10-CM

## 2016-01-07 DIAGNOSIS — R2681 Unsteadiness on feet: Secondary | ICD-10-CM

## 2016-01-07 DIAGNOSIS — M79604 Pain in right leg: Secondary | ICD-10-CM

## 2016-01-07 DIAGNOSIS — M79605 Pain in left leg: Secondary | ICD-10-CM

## 2016-01-07 NOTE — Therapy (Signed)
[facility] [address] [place], [place], [address] Phone: [phone]   Fax:  [phone]  Physical Therapy Treatment  Patient Details  Name: Lisa Walker MRN: [id number] Date of Birth: 1986/01/10 Referring Provider: Gurney Maxin MD  Encounter Date: 01/07/2016      PT End of Session - 01/07/16 1735    Visit Number 8   Number of Visits 15   Date for PT Re-Evaluation 01/28/16   PT Start Time [id number]   PT Stop Time 1731   PT Time Calculation (min) 60 min   Activity Tolerance Patient tolerated treatment well;Patient limited by fatigue   Behavior During Therapy [facility] for tasks assessed/performed      Past Medical History:  Diagnosis Date  . Medical history non-contributory   . PONV (postoperative nausea and vomiting)     Past Surgical History:  Procedure Laterality Date  . CESAREAN SECTION N/A 2011   performed in [place]  . CESAREAN SECTION N/A 09/03/2014   Procedure: repeat CESAREAN SECTION;  Surgeon: Aletha Halim, MD;  Location: [facility] ORS;  Service: Obstetrics;  Laterality: N/A;    There were no vitals filed for this visit.      Subjective Assessment - 01/07/16 1732    Subjective Pt states she is "feeling good." Reports she is back to performing 100% of housework at this time. States she was able to walk 1.5 miles; she had beginning LBP but sx resolved with cont. exercise.   Pertinent History pt. reports sudden loss of ability to walk but husband states she is able to walk with CGA into PT clinic.  Pt. reports B quad/ hip pain currently at rest.     Limitations Standing;Walking;House hold activities   Patient Stated Goals increase B LE muscle strength/ decrease LE pain/ improve gait   Currently in Pain? No/denies      Objective:  Therapeutic Exercise: Treadmill 10 minutes, .8-1.5 mph (no charge). Lifting 5lb box from floor to waist height shelf with emphasis on proper body mechanics x10. Functional reach task with cones to  simulate household tasks (i.e. Putting dishes/groceries away) x20. Sit<>stand x10 with no UE support. Sit<>stand with 55m walk 3x around cone towards left, 3x around cone towards right. Lumbar rotation x5 R/L.    Neuromuscular Re-ed: In // bars: Static stance 2 minutes. Tandem walking 76ft x1. Tandem stance 30 sec with no UE support. Functional reach with pt able to exceed 12 in from starting point without moving LE. Single leg stance >10 sec R/L x1. 6'' step taps x20 RLE/LLE ea.   Pt response for medical necessity: Pt able to perform all tasks without episodes of LE buckling or "getting stuck." She demonstrates inconsistence with L knee flexion during gait cycle; with increased flexion when she is focused on L sided task. Pt experiences mild-moderate back pain with functional reaching/mobility exercises but pain quickly subsides.       PT Education - 01/07/16 1735    Education provided Yes   Education Details encouraged pt to participate in increased functional activities including household duties/ambulation   Person(s) Educated Patient   Methods Explanation;Demonstration;Handout   Comprehension Verbalized understanding;Returned demonstration             PT Long Term Goals - 01/07/16 1747      PT LONG TERM GOAL #1   Title Pt will score >40/80 on LEFS to promote increase in functional mobility   Baseline 8/17: 26/80 9/12: 53/80   Time 4   Period  Weeks   Status Achieved     PT LONG TERM GOAL #2   Title Pt will score >40/56 on BERG to promote increase in amb/standing stability and safety   Baseline 8/17: 32/56 9/12: 56/56   Time 4   Period Weeks   Status Achieved     PT LONG TERM GOAL #3   Title Pt will tolerate >20 minutes of continued standing exercise to promote return to PLOF   Baseline pt able to tolerate <5 minutes currently before requiring seated rest break secondary to weakness and onset of pain 9/12: pt able to ambulate for approx 1 hr   Time 4   Period Weeks    Status Achieved     PT LONG TERM GOAL #4   Title Pt will perform sit<>stand with no UE support with <1/10 pain in LE to promote return of functional mobility   Baseline Pt unable to perform sit<>stand without UE assist; tendency for decreased speed/power. 9/12: sit<>stand no UE assist, no pain    Time 4   Period Weeks   Status New     PT LONG TERM GOAL #5   Title Pt will perform consistent and normalized gait pattern with out tendency for L knee flexion to promote return to PLOF   Baseline pt with intermittent L knee flexion during gait cycle   Time 4   Period Weeks   Status New     Additional Long Term Goals   Additional Long Term Goals Yes     PT LONG TERM GOAL #6   Title Pt will perform TUG in the equivalent amount of time leading with LLE as compared to leading with RLE   Baseline 9/12: TUG pt turns R around the cone  9.33 sec, 8.26sec, 7.27 sec; pt turns L around cone L 10.35sec, 10.74 sec, 8.05 sec   Time 4   Period Weeks   Status New            Plan - 01/07/16 1737    Clinical Impression Statement Pt has demonstrated an increase in functional mobility and balance over the last month. She scores 56/56 on BERG balance assessment and LEFS 56/80 today. Grip strength R/L 52.2, 43.5. Pt shows intermittent difficulty with ambulation/coordination tasks with increased L knee flexion. Although she experiences episodes of increased L knee flexion during ambulation and therapeutic exercise she maintains stance and never experiences LOB or LLE buckling. When pt performs TUG, she scores consistently different based on whether she turns R (9.33sec, 8.26sec, 7.27sec)  around the cone versus L (10.35sec, 10.74sec, 8.05sec). Due to pt progress regarding self percieved functional mobility, balance and more consistently normalized gait pattern in response to PT, pt will benefit from continued PT to address remaining limitations with an emphasis towards independent management of residual sx.    Rehab Potential Good   PT Frequency 2x / week  attempt to decrease frequency over next 4 weeks to promote indep management   PT Duration 4 weeks   PT Treatment/Interventions ADLs/Self Care Home Management;Gait training;Stair training;Functional mobility training;Therapeutic activities;Therapeutic exercise;Balance training;Patient/family education;Neuromuscular re-education;Manual techniques;Passive range of motion;Energy conservation   PT Next Visit Plan progress toward functional mobility   Consulted and Agree with Plan of Care Patient;Family member/caregiver   Family Member Consulted Husband      Patient will benefit from skilled therapeutic intervention in order to improve the following deficits and impairments:  Abnormal gait, Improper body mechanics, Pain, Postural dysfunction, Decreased mobility, Decreased coordination, Decreased activity tolerance, Decreased endurance,  Decreased range of motion, Decreased strength, Difficulty walking, Decreased safety awareness, Decreased balance  Visit Diagnosis: Muscle weakness (generalized)  Gait difficulty  Unsteady gait  Pain In Right Leg  Pain In Left Leg     Problem List Patient Active Problem List   Diagnosis Date Noted  . B12 deficiency 12/16/2015  . Vitamin D deficiency 12/16/2015  . Difficulty walking 12/12/2015  . Weakness of lower extremity 12/12/2015  . Iron deficiency anemia due to chronic blood loss 12/04/2015  . Vitamin B 12 deficiency 12/04/2015  . Vitamin D insufficiency 12/04/2015  . Indication for care in labor or delivery 09/03/2014  . S/P cesarean section 09/03/2014  . Supervision of normal pregnancy 09/02/2014  . H/O cesarean section complicating pregnancy [id number]   Pura Spice, PT, DPT # [phone] Derrill Memo, SPT 01/08/2016, 7:27 AM  [place] [facility] [address] [place], [place], [address] Phone: [phone]   Fax:  [phone]  Name: Lisa Walker MRN:  [id number] Date of Birth: [birth year]

## 2016-01-09 ENCOUNTER — Other Ambulatory Visit: Payer: Self-pay | Admitting: Neurology

## 2016-01-09 ENCOUNTER — Ambulatory Visit: Payer: BLUE CROSS/BLUE SHIELD | Admitting: Physical Therapy

## 2016-01-09 DIAGNOSIS — R2681 Unsteadiness on feet: Secondary | ICD-10-CM

## 2016-01-09 DIAGNOSIS — R269 Unspecified abnormalities of gait and mobility: Secondary | ICD-10-CM

## 2016-01-09 DIAGNOSIS — M79605 Pain in left leg: Secondary | ICD-10-CM

## 2016-01-09 DIAGNOSIS — M79604 Pain in right leg: Secondary | ICD-10-CM

## 2016-01-09 DIAGNOSIS — R29898 Other symptoms and signs involving the musculoskeletal system: Secondary | ICD-10-CM

## 2016-01-09 DIAGNOSIS — M6281 Muscle weakness (generalized): Secondary | ICD-10-CM

## 2016-01-09 DIAGNOSIS — R262 Difficulty in walking, not elsewhere classified: Secondary | ICD-10-CM

## 2016-01-09 NOTE — Therapy (Signed)
[facility] [address] [place], [place], [address] Phone: [phone]   Fax:  [phone]  Physical Therapy Treatment  Patient Details  Name: Lisa Walker MRN: [id number] Date of Birth: October 10, 1985 Referring Provider: Gurney Maxin MD  Encounter Date: 01/09/2016      PT End of Session - 01/09/16 1823    Visit Number 8   Number of Visits 15   Date for PT Re-Evaluation 02/04/16   PT Start Time 1630   PT Stop Time 1719   PT Time Calculation (min) 49 min   Activity Tolerance Patient tolerated treatment well;Patient limited by fatigue;Patient limited by pain   Behavior During Therapy [facility] for tasks assessed/performed      Past Medical History:  Diagnosis Date  . Medical history non-contributory   . PONV (postoperative nausea and vomiting)     Past Surgical History:  Procedure Laterality Date  . CESAREAN SECTION N/A 2011   performed in [place]  . CESAREAN SECTION N/A 09/03/2014   Procedure: repeat CESAREAN SECTION;  Surgeon: Aletha Halim, MD;  Location: [facility] ORS;  Service: Obstetrics;  Laterality: N/A;    There were no vitals filed for this visit.      Subjective Assessment - 01/09/16 1821    Subjective Pt states that she continues to walk 1.5 miles regularly. She reports that she is having difficulty raising her left leg in the morning and is unable to hold is up. States that she has been having regular foot cramps in both of her feet.   Pertinent History pt. reports sudden loss of ability to walk but husband states she is able to walk with CGA into PT clinic.  Pt. reports B quad/ hip pain currently at rest.     Limitations Standing;Walking;House hold activities   How long can you stand comfortably? 5 min   How long can you walk comfortably? 5 min (husband assist since 8/16)   Patient Stated Goals increase B LE muscle strength/ decrease LE pain/ improve gait   Currently in Pain? Yes   Pain Score 6    Pain Location Back    Pain Orientation Right;Left   Pain Score 7   Pain Location Leg   Pain Orientation Right;Left      Objective:  Therapeutic Exercise: Supine SLR RLE x5, LLE x1. Seated marching x30 BLE, seated LAQ x30 BLE, seated hip abd with RTB x30, seated hip add with small ball x30, seated heel raises x30. Backwards walking x43ft. 6'' step up with reciprocal hip flexion x5 RLE/LLE. On green theraball hip flexion x30, LAQ x30, heel raises x30. Rows with scapular retraction RTB x20.   Pt response for medical necessity: Pt demonstrates mild set-back this session with altered gait pattern including difficulty with RLE clearance/toe off. She has difficulty performing isolated strength maneuvers but shows good stability/strength with more complex tasks. Pt experiences several episodes of moderately painful foot cramping during session with quick resolution of sx once seated.      PT Education - 01/09/16 1822    Education provided Yes   Education Details Re-issued HEP; encouraged pt to practice LAQ and marching on theraball   Person(s) Educated Patient;Spouse   Methods Explanation;Demonstration;Handout   Comprehension Verbalized understanding;Returned demonstration             PT Long Term Goals - 01/07/16 1747      PT LONG TERM GOAL #1   Title Pt will score >40/80 on LEFS to promote increase in functional mobility  Baseline 8/17: 26/80 9/12: 53/80   Time 4   Period Weeks   Status Achieved     PT LONG TERM GOAL #2   Title Pt will score >40/56 on BERG to promote increase in amb/standing stability and safety   Baseline 8/17: 32/56 9/12: 56/56   Time 4   Period Weeks   Status Achieved     PT LONG TERM GOAL #3   Title Pt will tolerate >20 minutes of continued standing exercise to promote return to PLOF   Baseline pt able to tolerate <5 minutes currently before requiring seated rest break secondary to weakness and onset of pain 9/12: pt able to ambulate for approx 1 hr   Time 4   Period Weeks    Status Achieved     PT LONG TERM GOAL #4   Title Pt will perform sit<>stand with no UE support with <1/10 pain in LE to promote return of functional mobility   Baseline Pt unable to perform sit<>stand without UE assist; tendency for decreased speed/power. 9/12: sit<>stand no UE assist, no pain    Time 4   Period Weeks   Status New     PT LONG TERM GOAL #5   Title Pt will perform consistent and normalized gait pattern with out tendency for L knee flexion to promote return to PLOF   Baseline pt with intermittent L knee flexion during gait cycle   Time 4   Period Weeks   Status New     Additional Long Term Goals   Additional Long Term Goals Yes     PT LONG TERM GOAL #6   Title Pt will perform TUG in the equivalent amount of time leading with LLE as compared to leading with RLE   Baseline 9/12: TUG pt turns R around the cone  9.33 sec, 8.26sec, 7.27 sec; pt turns L around cone L 10.35sec, 10.74 sec, 8.05 sec   Time 4   Period Weeks   Status New           Plan - 01/09/16 1823    Clinical Impression Statement Pt arrives to PT session with altered gait pattern, with decreased toe off and knee flexion of R foot, normalized pattern on L side. She demonstrates inability to perform supine SLR with LLE and difficulty reaching full knee extension; but is able to demonstrate full knee extension and single leg stance on LE with no difficulty. Pt performs several steps up 6'' step with reciprocal hip flexion on bilateral LE before experiencing L sided foot cramping that prevents her from continuing exercise.   Rehab Potential Good   PT Frequency 2x / week   PT Duration 4 weeks   PT Treatment/Interventions ADLs/Self Care Home Management;Gait training;Stair training;Functional mobility training;Therapeutic activities;Therapeutic exercise;Balance training;Patient/family education;Neuromuscular re-education;Manual techniques;Passive range of motion;Energy conservation   PT Next Visit Plan  progress toward functional mobility   PT Home Exercise Plan See HEP   Consulted and Agree with Plan of Care Patient;Family member/caregiver   Family Member Consulted Husband      Patient will benefit from skilled therapeutic intervention in order to improve the following deficits and impairments:  Abnormal gait, Improper body mechanics, Pain, Postural dysfunction, Decreased mobility, Decreased coordination, Decreased activity tolerance, Decreased endurance, Decreased range of motion, Decreased strength, Difficulty walking, Decreased safety awareness, Decreased balance  Visit Diagnosis: Muscle weakness (generalized)  Gait difficulty  Unsteady gait  Pain In Right Leg  Pain In Left Leg     Problem List Patient Active  Problem List   Diagnosis Date Noted  . B12 deficiency 12/16/2015  . Vitamin D deficiency 12/16/2015  . Difficulty walking 12/12/2015  . Weakness of lower extremity 12/12/2015  . Iron deficiency anemia due to chronic blood loss 12/04/2015  . Vitamin B 12 deficiency 12/04/2015  . Vitamin D insufficiency 12/04/2015  . Indication for care in labor or delivery 09/03/2014  . S/P cesarean section 09/03/2014  . Supervision of normal pregnancy 09/02/2014  . H/O cesarean section complicating pregnancy [id number]   Pura Spice, PT, DPT # [phone] Mickel Baas Trystin Hargrove SPT 01/09/2016, 6:27 PM  [facility] [address] [place], [place], [address] Phone: [phone]   Fax:  [phone]  Name: Lisa Walker MRN: [id number] Date of Birth: Oct 15, 1985

## 2016-01-10 ENCOUNTER — Encounter: Payer: Self-pay | Admitting: Psychiatry

## 2016-01-10 ENCOUNTER — Ambulatory Visit (INDEPENDENT_AMBULATORY_CARE_PROVIDER_SITE_OTHER): Payer: BLUE CROSS/BLUE SHIELD | Admitting: Psychiatry

## 2016-01-10 VITALS — BP 116/79 | HR 80 | Temp 98.5°F | Ht 64.0 in | Wt 137.4 lb

## 2016-01-10 DIAGNOSIS — F444 Conversion disorder with motor symptom or deficit: Secondary | ICD-10-CM | POA: Diagnosis not present

## 2016-01-10 DIAGNOSIS — F411 Generalized anxiety disorder: Secondary | ICD-10-CM

## 2016-01-10 MED ORDER — DIAZEPAM 2 MG PO TABS: 2.0000 mg | ORAL_TABLET | Freq: Every evening | ORAL | 0 refills | Status: DC | PRN

## 2016-01-10 NOTE — Progress Notes (Signed)
Psychiatric MD Follow up Note  Patient Identification: Lisa Walker MRN:  [id number] Date of Evaluation:  01/10/2016 Referral Source: Gurney Maxin, M.D  Chief Complaint:   Chief Complaint    Follow-up; Medication Refill     Visit Diagnosis:    ICD-9-CM ICD-10-CM   1. Conversion disorder with abnormal movement, persistent, with psychological stressor 300.11 F44.4   2. Anxiety state 300.00 F41.1     History of Present Illness:    Patient is a 30 year old married female who is originally from [place] presented forFollow-up. She was calm and alert during the interview. She was able to by herself. She reported that she started taking the diazepam 2 mg at bedtime after her first appointment but then she consulted with and the physician friend who advised her to start taking 2 pills. She took 2 pills for one week but it was too sedating and then she stopped taking the medication. She reported that she is doing physical therapy twice weekly at this time. She does not have any acute symptoms. She reported that occasionally she feels very anxious and has to go out of the house and wants to cry and has palpitations. Her husband remains very supportive. She reported that her husband is asking her if she would like to go back home as her brother-in-law is getting married next month. She reported that she does not want at this time. However she will think about it as her family lives there as well. She appeared friendly and cooperative during the interview. She does not have any symptoms including neurological symptoms at this time. Occasionally she will have anxiety which should last less than 30 minutes. She does not want to take any medications on a regular basis.   She currently denied having any suicidal ideations or plans. She denied having any mood anger anxiety or paranoia.   Associated Signs/Symptoms: Depression Symptoms:  fatigue, anxiety, (Hypo) Manic Symptoms:  denied Anxiety  Symptoms:  Excessive Worry, Psychotic Symptoms:  denied PTSD Symptoms: Negative NA  Past Psychiatric History:   Patient reported that she has never seen a psychiatrist in the past. She has never been admitted to a psychiatric hospital and has not tried to hurt herself.  Previous Psychotropic Medications: Diazepam 0.25 mg taken while she was young in [place].  Substance Abuse History in the last 12 months:  No.  Consequences of Substance Abuse: Negative NA  Past Medical History:  Past Medical History:  Diagnosis Date  . Medical history non-contributory   . PONV (postoperative nausea and vomiting)     Past Surgical History:  Procedure Laterality Date  . CESAREAN SECTION N/A 2011   performed in [place]  . CESAREAN SECTION N/A 09/03/2014   Procedure: repeat CESAREAN SECTION;  Surgeon: Aletha Halim, MD;  Location: [facility] ORS;  Service: Obstetrics;  Laterality: N/A;    Family Psychiatric History:  Pt  denied any history of depression or anxiety in her family members.  Family History:  Family History  Problem Relation Age of Onset  . Diabetes Father     Social History:   Social History   Social History  . Marital status: Married    Spouse name: N/A  . Number of children: N/A  . Years of education: N/A   Social History Main Topics  . Smoking status: Never Smoker  . Smokeless tobacco: Never Used  . Alcohol use No  . Drug use: No  . Sexual activity: Yes    Birth control/ protection: None  Other Topics Concern  . None   Social History Narrative  . None    Additional Social History:  Pt is originally from [place]. She has moved to [place] initially 6 years ago after she got married. She has moved to [place] 4 years ago. Now she lives in [place]. Married 6 years and has 2 children ages 34 years old and 24 year old.  Allergies:   Allergies  Allergen Reactions  . Pineapple     Metabolic Disorder Labs: No results found for: HGBA1C, MPG No  results found for: PROLACTIN No results found for: CHOL, TRIG, HDL, CHOLHDL, VLDL, LDLCALC   Current Medications: Current Outpatient Prescriptions  Medication Sig Dispense Refill  . Cyanocobalamin (RA VITAMIN B-12 TR) 1000 MCG TBCR Take by mouth.    . diazepam (VALIUM) 2 MG tablet Take 1 tablet (2 mg total) by mouth at bedtime as needed for anxiety. Take 1/2 - 1 pill at bed time as needed . 30 tablet 0  . Vitamin D, Ergocalciferol, (DRISDOL) 50000 units CAPS capsule      No current facility-administered medications for this visit.     Neurologic: Headache: No Seizure: No Paresthesias:No  Musculoskeletal: Strength & Muscle Tone: within normal limits Gait & Station: weakness while standing especially in left but able to walk when no one is watching Patient leans: N/A  Psychiatric Specialty Exam: Review of Systems  Constitutional: Positive for malaise/fatigue.  Musculoskeletal: Positive for back pain.  Psychiatric/Behavioral: The patient is nervous/anxious.     Blood pressure 116/79, pulse 80, temperature 98.5 F (36.9 C), temperature source Oral, height 5\' 4"  (1.626 m), weight 137 lb 6.4 oz (62.3 kg), unknown if currently breastfeeding.Body mass index is 23.58 kg/m.  General Appearance: Casual  Eye Contact:  Fair  Speech:  Clear and Coherent  Volume:  Normal  Mood:  Anxious  Affect:  Congruent  Thought Process:  Coherent  Orientation:  Full (Time, Place, and Person)  Thought Content:  WDL  Suicidal Thoughts:  No  Homicidal Thoughts:  No  Memory:  Immediate;   Fair Recent;   Fair Remote;   Fair  Judgement:  Fair  Insight:  Fair  Psychomotor Activity:  Normal  Concentration:  Concentration: Fair and Attention Span: Fair  Recall:  [employer] of Knowledge:Fair  Language: Fair  Akathisia:  No  Handed:  Right  AIMS (if indicated):    Assets:  Communication Skills Desire for Improvement Physical Health Social Support  ADL's:  Intact  Cognition: WNL  Sleep:      Treatment Plan Summary: Medication management   Discussed with patient what the medications. She Was advised to take diazepam half to one pill on a when necessary basis and she agreed with the plan.   Follow-up in 3 weeks or earlier depending on her symptoms.   More than 50% of the time spent in psychoeducation, counseling and coordination of care.    This note was generated in part or whole with voice recognition software. Voice regonition is usually quite accurate but there are transcription errors that can and very often do occur. I apologize for any typographical errors that were not detected and corrected.    Rainey Pines, MD 9/15/20179:51 AM

## 2016-01-15 ENCOUNTER — Ambulatory Visit: Payer: BLUE CROSS/BLUE SHIELD | Admitting: Physical Therapy

## 2016-01-15 ENCOUNTER — Encounter: Payer: Self-pay | Admitting: Physical Therapy

## 2016-01-15 DIAGNOSIS — M79604 Pain in right leg: Secondary | ICD-10-CM

## 2016-01-15 DIAGNOSIS — M6281 Muscle weakness (generalized): Secondary | ICD-10-CM

## 2016-01-15 DIAGNOSIS — R2681 Unsteadiness on feet: Secondary | ICD-10-CM

## 2016-01-15 DIAGNOSIS — R269 Unspecified abnormalities of gait and mobility: Secondary | ICD-10-CM

## 2016-01-15 DIAGNOSIS — M79605 Pain in left leg: Secondary | ICD-10-CM

## 2016-01-15 NOTE — Therapy (Signed)
[facility] [address] [place], [place], [address] Phone: [phone]   Fax:  [phone]  Physical Therapy Treatment  Patient Details  Name: Lisa Walker MRN: [id number] Date of Birth: [birth year] Referring Provider: Gurney Maxin MD  Encounter Date: 01/15/2016      PT End of Session - 01/15/16 1638    Visit Number 10   Number of Visits 15   Date for PT Re-Evaluation 02/04/16   PT Start Time [id number]   PT Stop Time 1644   PT Time Calculation (min) 48 min   Activity Tolerance Patient tolerated treatment well   Behavior During Therapy [facility] for tasks assessed/performed      Past Medical History:  Diagnosis Date  . Medical history non-contributory   . PONV (postoperative nausea and vomiting)     Past Surgical History:  Procedure Laterality Date  . CESAREAN SECTION N/A 2011   performed in [place]  . CESAREAN SECTION N/A 09/03/2014   Procedure: repeat CESAREAN SECTION;  Surgeon: Aletha Halim, MD;  Location: [facility] ORS;  Service: Obstetrics;  Laterality: N/A;    There were no vitals filed for this visit.      Subjective Assessment - 01/15/16 1637    Subjective Pt states that her pain/weakness continues to be intermittent. States that when she goes on her long walks she feels leg pain and some shoulder pain but it is not constant.    Pertinent History pt. reports sudden loss of ability to walk but husband states she is able to walk with CGA into PT clinic.  Pt. reports B quad/ hip pain currently at rest.     Limitations Standing;Walking;House hold activities   Currently in Pain? No/denies      Objective:  Gait: Overground walking outside of PT clinic with navigation of curbs, steps and grassy/uneven surfaces. Pt with no LOB/episodes of instability and no onset of fatigue.  Therapeutic Exercise: Squat with air ex pad for tactile cueing 2x10. [place] deadlift single leg R/L 2x10 ea; with cueing to decrease tendency for  compensatory pelvic rotation. Resisted walks with Nautilus #50 forward/backwards with partial lunge x5, #40 lateral walks x5 R/L. Standing hamstring/quad stretch 30 sec x2, standing calf stretch x2. Pt c/o of pain in L knee, R ankle when performing stretching program.  Neuromuscular Re-ed: mini squat on BOSU x10, Y balance x5 RLE/LLE.  Pt response for medical necessity: Pt demonstrates excellent functional mobility this session with normalized gait pattern for entirety of session. She performs high level strengthening and balance tasks with no LOB. Pt is progressing well towards independent management of endurance/strengthening program.      PT Long Term Goals - 01/07/16 1747      PT LONG TERM GOAL #1   Title Pt will score >40/80 on LEFS to promote increase in functional mobility   Baseline 8/17: 26/80 9/12: 53/80   Time 4   Period Weeks   Status Achieved     PT LONG TERM GOAL #2   Title Pt will score >40/56 on BERG to promote increase in amb/standing stability and safety   Baseline 8/17: 32/56 9/12: 56/56   Time 4   Period Weeks   Status Achieved     PT LONG TERM GOAL #3   Title Pt will tolerate >20 minutes of continued standing exercise to promote return to PLOF   Baseline pt able to tolerate <5 minutes currently before requiring seated rest break secondary to weakness and onset of pain 9/12: pt  able to ambulate for approx 1 hr   Time 4   Period Weeks   Status Achieved     PT LONG TERM GOAL #4   Title Pt will perform sit<>stand with no UE support with <1/10 pain in LE to promote return of functional mobility   Baseline Pt unable to perform sit<>stand without UE assist; tendency for decreased speed/power. 9/12: sit<>stand no UE assist, no pain    Time 4   Period Weeks   Status New     PT LONG TERM GOAL #5   Title Pt will perform consistent and normalized gait pattern with out tendency for L knee flexion to promote return to PLOF   Baseline pt with intermittent L knee flexion  during gait cycle   Time 4   Period Weeks   Status New     Additional Long Term Goals   Additional Long Term Goals Yes     PT LONG TERM GOAL #6   Title Pt will perform TUG in the equivalent amount of time leading with LLE as compared to leading with RLE   Baseline 9/12: TUG pt turns R around the cone  9.33 sec, 8.26sec, 7.27 sec; pt turns L around cone L 10.35sec, 10.74 sec, 8.05 sec   Time 4   Period Weeks   Status New               Plan - 01/15/16 1639    Clinical Impression Statement Pt arrives to PT clinic with completely normalized gait pattern with no substitutions/compensations. She is able to perform overground walking on uneven terrain with navigation of curbs and stairs with no LOB or moments of instability. She is able to perform high level strengthening and balance tasks this session with no c/o. Pt sensitive to LE stretching program including quadriceps, hamstrings and gastroc with inconsistent reporting of knee/ankle pain during stretches.   Rehab Potential Good   PT Frequency 1x / week   PT Duration 4 weeks   PT Treatment/Interventions ADLs/Self Care Home Management;Gait training;Stair training;Functional mobility training;Therapeutic activities;Therapeutic exercise;Balance training;Patient/family education;Neuromuscular re-education;Manual techniques;Passive range of motion;Energy conservation   PT Next Visit Plan progress toward functional mobility   PT Home Exercise Plan See HEP   Consulted and Agree with Plan of Care Patient;Family member/caregiver   Family Member Consulted Husband      Patient will benefit from skilled therapeutic intervention in order to improve the following deficits and impairments:  Abnormal gait, Improper body mechanics, Pain, Postural dysfunction, Decreased mobility, Decreased coordination, Decreased activity tolerance, Decreased endurance, Decreased range of motion, Decreased strength, Difficulty walking, Decreased safety awareness,  Decreased balance  Visit Diagnosis: Muscle weakness (generalized)  Gait difficulty  Unsteady gait  Pain In Right Leg  Pain In Left Leg     Problem List Patient Active Problem List   Diagnosis Date Noted  . B12 deficiency 12/16/2015  . Vitamin D deficiency 12/16/2015  . Difficulty walking 12/12/2015  . Weakness of lower extremity 12/12/2015  . Iron deficiency anemia due to chronic blood loss 12/04/2015  . Vitamin B 12 deficiency 12/04/2015  . Vitamin D insufficiency 12/04/2015  . Indication for care in labor or delivery 09/03/2014  . S/P cesarean section 09/03/2014  . Supervision of normal pregnancy 09/02/2014  . H/O cesarean section complicating pregnancy [id number]   Pura Spice, PT, DPT # [phone] Mickel Baas Maurico Perrell SPT 01/15/2016, 4:53 PM   [facility] [address] [place], [place], [address] Phone: [phone]  Fax:  [phone]  Name: Elaysha Luciano MRN: [id number] Date of Birth: Dec 14, 1985

## 2016-01-17 ENCOUNTER — Ambulatory Visit: Payer: BLUE CROSS/BLUE SHIELD

## 2016-01-23 ENCOUNTER — Encounter: Payer: Self-pay | Admitting: Physical Therapy

## 2016-01-23 ENCOUNTER — Ambulatory Visit: Payer: BLUE CROSS/BLUE SHIELD | Admitting: Physical Therapy

## 2016-01-23 DIAGNOSIS — M79605 Pain in left leg: Secondary | ICD-10-CM

## 2016-01-23 DIAGNOSIS — M6281 Muscle weakness (generalized): Secondary | ICD-10-CM

## 2016-01-23 DIAGNOSIS — R269 Unspecified abnormalities of gait and mobility: Secondary | ICD-10-CM

## 2016-01-23 DIAGNOSIS — R2681 Unsteadiness on feet: Secondary | ICD-10-CM

## 2016-01-23 DIAGNOSIS — M79604 Pain in right leg: Secondary | ICD-10-CM

## 2016-01-23 NOTE — Therapy (Signed)
[facility] [address]. [place], [place], [address] Phone: [phone]   Fax:  [phone]  Physical Therapy Treatment  Patient Details  Name: Lisa Walker MRN: [id number] Date of Birth: 11-22-1985 Referring Provider: Gurney Maxin MD  Encounter Date: 01/23/2016      PT End of Session - 01/23/16 1852    Visit Number 11   Number of Visits 15   Date for PT Re-Evaluation 02/04/16   PT Start Time 1600   PT Stop Time 1652   PT Time Calculation (min) 52 min   Activity Tolerance Patient tolerated treatment well;Patient limited by pain   Behavior During Therapy [facility] for tasks assessed/performed      Past Medical History:  Diagnosis Date  . Medical history non-contributory   . PONV (postoperative nausea and vomiting)     Past Surgical History:  Procedure Laterality Date  . CESAREAN SECTION N/A 2011   performed in [place]  . CESAREAN SECTION N/A 09/03/2014   Procedure: repeat CESAREAN SECTION;  Surgeon: Aletha Halim, MD;  Location: [facility] ORS;  Service: Obstetrics;  Laterality: N/A;    There were no vitals filed for this visit.      Subjective Assessment - 01/23/16 1851    Subjective Pt states that she is walking 3 miles per day regularly. She has pain/fatigue initially but states that as she continues to walk she feels better. States that she has occasional bouts of leg weakness where she resumes crouched walking posture and states that her left leg is always the worst of the two.   Pertinent History pt. reports sudden loss of ability to walk but husband states she is able to walk with CGA into PT clinic.  Pt. reports B quad/ hip pain currently at rest.     Limitations Standing;Walking;House hold activities   Patient Stated Goals increase B LE muscle strength/ decrease LE pain/ improve gait   Currently in Pain? No/denies     Objective:   Therapeutic Exercise: SciFit Level 4 10 minutes (cool down/no charge). Hip flexion on  agility ladder 60ft x4. [place] 32ft x4. Agility drill (forward step/diagonal step/backwards step) 19ft x4. Rows #20 3x15 with cueing for set up/posture. Single leg ball toss on rebounder R/L 5 bouts; approx. 8 tosses per bout. 12'' step up with reciprocal hip flexion x20 R/L. Squat with 12'' step + air ex pad for tacile cueing x10. Wall squat x10. Cervical AROM all planes. Cervical lateral flexion/upper trap stretch 30 sec holds bilat.  Pt response for medical necessity: Pt shows good initial progress this session with no c/o of pain/fatigue in bilateral LE during first 40 minutes of session. Following squat task she experiences mild LE pain, requires extended seated rest break and ambulates with crouched posture (increased hip/knee flexion). Following 10 minutes on the SciFit pt able to ambulate out of the clinic with normalized gait pattern but decreased speed.      PT Long Term Goals - 01/07/16 1747      PT LONG TERM GOAL #1   Title Pt will score >40/80 on LEFS to promote increase in functional mobility   Baseline 8/17: 26/80 9/12: 53/80   Time 4   Period Weeks   Status Achieved     PT LONG TERM GOAL #2   Title Pt will score >40/56 on BERG to promote increase in amb/standing stability and safety   Baseline 8/17: 32/56 9/12: 56/56   Time 4   Period Weeks   Status Achieved  PT LONG TERM GOAL #3   Title Pt will tolerate >20 minutes of continued standing exercise to promote return to PLOF   Baseline pt able to tolerate <5 minutes currently before requiring seated rest break secondary to weakness and onset of pain 9/12: pt able to ambulate for approx 1 hr   Time 4   Period Weeks   Status Achieved     PT LONG TERM GOAL #4   Title Pt will perform sit<>stand with no UE support with <1/10 pain in LE to promote return of functional mobility   Baseline Pt unable to perform sit<>stand without UE assist; tendency for decreased speed/power. 9/12: sit<>stand no UE assist, no pain    Time 4    Period Weeks   Status New     PT LONG TERM GOAL #5   Title Pt will perform consistent and normalized gait pattern with out tendency for L knee flexion to promote return to PLOF   Baseline pt with intermittent L knee flexion during gait cycle   Time 4   Period Weeks   Status New     Additional Long Term Goals   Additional Long Term Goals Yes     PT LONG TERM GOAL #6   Title Pt will perform TUG in the equivalent amount of time leading with LLE as compared to leading with RLE   Baseline 9/12: TUG pt turns R around the cone  9.33 sec, 8.26sec, 7.27 sec; pt turns L around cone L 10.35sec, 10.74 sec, 8.05 sec   Time 4   Period Weeks   Status New               Plan - 01/23/16 1853    Clinical Impression Statement Pt ambulates into PT with normalized gait pattern and no c/o of LE pain. Pt demonstrates good tolerance of all therapeutic exercises with above functional strength/balance. She demonstrates mild anxiety in performing high level tasks at initial onset but is able to complete all tasks. During full squat exercise, pt experiences bilateral knee pain with no improved tolerance when performed as wall squat with decreased depth. Following this task she begins to walk with crouched posture (increased knee/hip flexion bilaterally) but is able to ambulate out of clinic after short rest break with no c/o and no compensations.   Rehab Potential Good   PT Frequency 1x / week   PT Duration 4 weeks   PT Treatment/Interventions ADLs/Self Care Home Management;Gait training;Stair training;Functional mobility training;Therapeutic activities;Therapeutic exercise;Balance training;Patient/family education;Neuromuscular re-education;Manual techniques;Passive range of motion;Energy conservation   PT Next Visit Plan progress toward functional mobility   PT Home Exercise Plan See HEP   Consulted and Agree with Plan of Care Patient;Family member/caregiver   Family Member Consulted Husband       Patient will benefit from skilled therapeutic intervention in order to improve the following deficits and impairments:  Abnormal gait, Improper body mechanics, Pain, Postural dysfunction, Decreased mobility, Decreased coordination, Decreased activity tolerance, Decreased endurance, Decreased range of motion, Decreased strength, Difficulty walking, Decreased safety awareness, Decreased balance  Visit Diagnosis: Muscle weakness (generalized)  Gait difficulty  Unsteady gait  Pain In Right Leg  Pain In Left Leg     Problem List Patient Active Problem List   Diagnosis Date Noted  . B12 deficiency 12/16/2015  . Vitamin D deficiency 12/16/2015  . Difficulty walking 12/12/2015  . Weakness of lower extremity 12/12/2015  . Iron deficiency anemia due to chronic blood loss 12/04/2015  . Vitamin B 12  deficiency 12/04/2015  . Vitamin D insufficiency 12/04/2015  . Indication for care in labor or delivery 09/03/2014  . S/P cesarean section 09/03/2014  . Supervision of normal pregnancy 09/02/2014  . H/O cesarean section complicating pregnancy [id number]   Pura Spice, PT, DPT # [phone] Mickel Baas Booker Bhatnagar SPT 01/23/2016, 7:01 PM  [facility] [address] [place], [place], [address] Phone: [phone]   Fax:  [phone]  Name: Lisa Walker MRN: [id number] Date of Birth: 03/24/1986

## 2016-01-29 ENCOUNTER — Ambulatory Visit: Payer: BLUE CROSS/BLUE SHIELD | Attending: Neurology | Admitting: Physical Therapy

## 2016-01-29 ENCOUNTER — Encounter: Payer: Self-pay | Admitting: Physical Therapy

## 2016-01-29 DIAGNOSIS — M79604 Pain in right leg: Secondary | ICD-10-CM | POA: Insufficient documentation

## 2016-01-29 DIAGNOSIS — R269 Unspecified abnormalities of gait and mobility: Secondary | ICD-10-CM | POA: Diagnosis present

## 2016-01-29 DIAGNOSIS — R2681 Unsteadiness on feet: Secondary | ICD-10-CM | POA: Insufficient documentation

## 2016-01-29 DIAGNOSIS — M6281 Muscle weakness (generalized): Secondary | ICD-10-CM | POA: Diagnosis present

## 2016-01-29 DIAGNOSIS — M79605 Pain in left leg: Secondary | ICD-10-CM | POA: Insufficient documentation

## 2016-01-29 NOTE — Therapy (Signed)
[facility] [address] [place], [place], [address] Phone: [phone]   Fax:  [phone]  Physical Therapy Treatment  Patient Details  Name: Lisa Walker MRN: [id number] Date of Birth: 1985-12-09 Referring Provider: Gurney Maxin MD  Encounter Date: 01/29/2016      PT End of Session - 01/29/16 1706    Visit Number 12   Number of Visits 15   Date for PT Re-Evaluation 02/04/16   PT Start Time 1601   PT Stop Time 1702   PT Time Calculation (min) 61 min   Activity Tolerance Patient tolerated treatment well   Behavior During Therapy [facility] for tasks assessed/performed      Past Medical History:  Diagnosis Date  . Medical history non-contributory   . PONV (postoperative nausea and vomiting)     Past Surgical History:  Procedure Laterality Date  . CESAREAN SECTION N/A 2011   performed in [place]  . CESAREAN SECTION N/A 09/03/2014   Procedure: repeat CESAREAN SECTION;  Surgeon: Aletha Halim, MD;  Location: [facility] ORS;  Service: Obstetrics;  Laterality: N/A;    There were no vitals filed for this visit.      Subjective Assessment - 01/29/16 1648    Subjective Pt states that she continues to particpate in regular walking program, she is trying to get up to 4 miles per day. States that her walking program is her main exercise.  Pt states that she has two doctor visits tomorrow.    Pertinent History pt. reports sudden loss of ability to walk but husband states she is able to walk with CGA into PT clinic.  Pt. reports B quad/ hip pain currently at rest.     Limitations Standing;Walking;House hold activities   Currently in Pain? No/denies      Objective:  Therapeutic Exercise: VMO extensions R/L x15. Hamstring curls with white theraball 2x20. Bridge on theraball x10. Box squat on low surface 3x10. Pistol squat on high surface (ischial tuberosity height) 2x10 R/L. Standing heel raises x30. Standing modified lunges x10 R/L. Step up with  reciprocal hip flexion contralateral shoulder flexion x10 R/L.   Treadmilll walking 10 minutes 1.9 mph (cool down/ no charge)  Pt response for medical necessity: Pt demonstrates good control and quality of movement for all exercises. She has initial difficulty with single leg pistol squat but with repeated practice is able to perform exercise with no deficits. Pt will benefit from progression to independent management of exercise program (gym based program) with one more PT visit to ensure good understanding of home program.       PT Education - 01/29/16 1706    Education provided Yes   Education Details Advanced LE strengthening program.   Person(s) Educated Patient;Spouse   Methods Demonstration;Explanation;Handout   Comprehension Verbalized understanding;Returned demonstration             PT Long Term Goals - 01/07/16 1747      PT LONG TERM GOAL #1   Title Pt will score >40/80 on LEFS to promote increase in functional mobility   Baseline 8/17: 26/80 9/12: 53/80   Time 4   Period Weeks   Status Achieved     PT LONG TERM GOAL #2   Title Pt will score >40/56 on BERG to promote increase in amb/standing stability and safety   Baseline 8/17: 32/56 9/12: 56/56   Time 4   Period Weeks   Status Achieved     PT LONG TERM GOAL #3  Title Pt will tolerate >20 minutes of continued standing exercise to promote return to PLOF   Baseline pt able to tolerate <5 minutes currently before requiring seated rest break secondary to weakness and onset of pain 9/12: pt able to ambulate for approx 1 hr   Time 4   Period Weeks   Status Achieved     PT LONG TERM GOAL #4   Title Pt will perform sit<>stand with no UE support with <1/10 pain in LE to promote return of functional mobility   Baseline Pt unable to perform sit<>stand without UE assist; tendency for decreased speed/power. 9/12: sit<>stand no UE assist, no pain    Time 4   Period Weeks   Status New     PT LONG TERM GOAL #5   Title  Pt will perform consistent and normalized gait pattern with out tendency for L knee flexion to promote return to PLOF   Baseline pt with intermittent L knee flexion during gait cycle   Time 4   Period Weeks   Status New     Additional Long Term Goals   Additional Long Term Goals Yes     PT LONG TERM GOAL #6   Title Pt will perform TUG in the equivalent amount of time leading with LLE as compared to leading with RLE   Baseline 9/12: TUG pt turns R around the cone  9.33 sec, 8.26sec, 7.27 sec; pt turns L around cone L 10.35sec, 10.74 sec, 8.05 sec   Time 4   Period Weeks   Status New            Plan - 01/29/16 1707    Clinical Impression Statement Pt has done well with progressive mobility/strengthening program. She is able to perform high level activities in the clinic with consistent performance. Pt is regularly ambulating over 13,000 steps a day; she states that her walking is not "normal" but in the clinic she demonstrates normalized gait pattern with no compensations after full session of therapeutic exercise. Pt responds to MMT strength testing with inconsistent mild left sided weakness with cogwheel response during hip flexion testing. Demonstrates full strength in gross LE strength during therapeutic exercise and excellent performance of high level balance tasks. PT would like to progress pt to gym based program at this time for independent strengthening and exercise program. Pt's husband expresses reluctance to ending physical therapy treatment at this time, but pt has maxed out benefits of physical therapy as she is regularly performing above PLOF in the clinic. Plans to see pt for one more visit to ensure good understanding of home exercise program and address strategies to progress exercise intensity. Will discuss strategies for progression into resumption of regular driving duties.   Rehab Potential Good   PT Frequency 1x / week   PT Duration 4 weeks   PT Treatment/Interventions  ADLs/Self Care Home Management;Gait training;Stair training;Functional mobility training;Therapeutic activities;Therapeutic exercise;Balance training;Patient/family education;Neuromuscular re-education;Manual techniques;Passive range of motion;Energy conservation   PT Next Visit Plan progress toward functional mobility   PT Home Exercise Plan See HEP   Consulted and Agree with Plan of Care Patient;Family member/caregiver   Family Member Consulted Husband      Patient will benefit from skilled therapeutic intervention in order to improve the following deficits and impairments:  Abnormal gait, Improper body mechanics, Pain, Postural dysfunction, Decreased mobility, Decreased coordination, Decreased activity tolerance, Decreased endurance, Decreased range of motion, Decreased strength, Difficulty walking, Decreased safety awareness, Decreased balance  Visit Diagnosis: Muscle weakness (generalized)  Gait difficulty  Unsteady gait  Pain in right leg  Pain in left leg     Problem List Patient Active Problem List   Diagnosis Date Noted  . B12 deficiency 12/16/2015  . Vitamin D deficiency 12/16/2015  . Difficulty walking 12/12/2015  . Weakness of lower extremity 12/12/2015  . Iron deficiency anemia due to chronic blood loss 12/04/2015  . Vitamin B 12 deficiency 12/04/2015  . Vitamin D insufficiency 12/04/2015  . Indication for care in labor or delivery 09/03/2014  . S/P cesarean section 09/03/2014  . Supervision of normal pregnancy 09/02/2014  . H/O cesarean section complicating pregnancy [id number]   Pura Spice, PT, DPT # [phone] Mickel Baas Arbell Wycoff SPT 01/29/2016, 5:20 PM  [facility] [address] [place], [place], [address] Phone: [phone]   Fax:  [phone]  Name: Khrystine Druschel MRN: [id number] Date of Birth: Dec 14, 1985

## 2016-01-30 ENCOUNTER — Ambulatory Visit (INDEPENDENT_AMBULATORY_CARE_PROVIDER_SITE_OTHER): Payer: BLUE CROSS/BLUE SHIELD | Admitting: Psychiatry

## 2016-01-30 ENCOUNTER — Ambulatory Visit: Payer: BLUE CROSS/BLUE SHIELD | Admitting: Psychiatry

## 2016-01-30 ENCOUNTER — Encounter: Payer: Self-pay | Admitting: Psychiatry

## 2016-01-30 VITALS — BP 110/75 | HR 81 | Wt 137.4 lb

## 2016-01-30 DIAGNOSIS — F411 Generalized anxiety disorder: Secondary | ICD-10-CM

## 2016-01-30 DIAGNOSIS — F444 Conversion disorder with motor symptom or deficit: Secondary | ICD-10-CM

## 2016-01-30 MED ORDER — DIAZEPAM 2 MG PO TABS: 2.0000 mg | ORAL_TABLET | Freq: Every evening | ORAL | 0 refills | Status: DC | PRN

## 2016-01-30 MED ORDER — ESCITALOPRAM OXALATE 5 MG PO TABS: 5.0000 mg | ORAL_TABLET | Freq: Every day | ORAL | 0 refills | Status: DC

## 2016-01-30 NOTE — Progress Notes (Signed)
Psychiatric MD Follow up Note  Patient Identification: Lisa Walker MRN:  [id number] Date of Evaluation:  01/30/2016 Referral Source: Gurney Maxin, M.D  Chief Complaint:   Chief Complaint    Follow-up; Medication Refill     Visit Diagnosis:    ICD-9-CM ICD-10-CM   1. Conversion disorder with abnormal movement, persistent, with psychological stressor 300.11 F44.4   2. Anxiety state 300.00 F41.1     History of Present Illness:    Patient is a 30 year old married female who is originally from [place] presented for Follow-up. She was calm and alert during the interview. She reported that she has been doing physical therapy on a regular basis. She reported that she had a PT did yesterday. Patient stated that she has been improving significantly on the medications and has been taking diazepam one pill at night and half a pill in the morning. She reported that her symptoms are improving and occasionally she feels anxious. Her husband also joined the interview. He reported that occasionally she feels anxious and is also interested in adjusting her medications. Patient reported that she does not have any acute stressors at this time. She appeared calm and alert during the interview. She denied having any suicidal homicidal ideations or plans at this time.   Patient is going to have an MRI done this week as suggested by Dr. Melrose Nakayama her neurologist her physical therapy will end next week she is able to walk by herself  She currently denied having any suicidal ideations or plans. She denied having any mood anger anxiety or paranoia.   Associated Signs/Symptoms: Depression Symptoms:  fatigue, anxiety, (Hypo) Manic Symptoms:  denied Anxiety Symptoms:  Excessive Worry, Psychotic Symptoms:  denied PTSD Symptoms: Negative NA  Past Psychiatric History:   Patient reported that she has never seen a psychiatrist in the past. She has never been admitted to a psychiatric hospital and has not tried  to hurt herself.  Previous Psychotropic Medications: Diazepam 0.25 mg taken while she was young in [place].  Substance Abuse History in the last 12 months:  No.  Consequences of Substance Abuse: Negative NA  Past Medical History:  Past Medical History:  Diagnosis Date  . Medical history non-contributory   . PONV (postoperative nausea and vomiting)     Past Surgical History:  Procedure Laterality Date  . CESAREAN SECTION N/A 2011   performed in [place]  . CESAREAN SECTION N/A 09/03/2014   Procedure: repeat CESAREAN SECTION;  Surgeon: Aletha Halim, MD;  Location: [facility] ORS;  Service: Obstetrics;  Laterality: N/A;    Family Psychiatric History:  Pt  denied any history of depression or anxiety in her family members.  Family History:  Family History  Problem Relation Age of Onset  . Diabetes Father     Social History:   Social History   Social History  . Marital status: Married    Spouse name: N/A  . Number of children: N/A  . Years of education: N/A   Social History Main Topics  . Smoking status: Never Smoker  . Smokeless tobacco: Never Used  . Alcohol use No  . Drug use: No  . Sexual activity: Yes    Birth control/ protection: None   Other Topics Concern  . None   Social History Narrative  . None    Additional Social History:  Pt is originally from [place]. She has moved to [facility] initially 6 years ago after she got married. She has moved to [place] 4 years ago. Now  she lives in [place]. Married 6 years and has 2 children ages 44 years old and 41 year old.  Allergies:   Allergies  Allergen Reactions  . Pineapple     Metabolic Disorder Labs: No results found for: HGBA1C, MPG No results found for: PROLACTIN No results found for: CHOL, TRIG, HDL, CHOLHDL, VLDL, LDLCALC   Current Medications: Current Outpatient Prescriptions  Medication Sig Dispense Refill  . Cyanocobalamin (RA VITAMIN B-12 TR) 1000 MCG TBCR Take by mouth.    .  diazepam (VALIUM) 2 MG tablet Take 1 tablet (2 mg total) by mouth at bedtime as needed for anxiety. Take 1/2 - 1 pill at bed time as needed . 30 tablet 0  . Vitamin D, Ergocalciferol, (DRISDOL) 50000 units CAPS capsule     . escitalopram (LEXAPRO) 5 MG tablet Take 1 tablet (5 mg total) by mouth daily. 30 tablet 0   No current facility-administered medications for this visit.     Neurologic: Headache: No Seizure: No Paresthesias:No  Musculoskeletal: Strength & Muscle Tone: within normal limits Gait & Station: weakness while standing especially in left but able to walk when no one is watching Patient leans: N/A  Psychiatric Specialty Exam: Review of Systems  Constitutional: Positive for malaise/fatigue.  Musculoskeletal: Positive for back pain and neck pain.  Psychiatric/Behavioral: The patient is nervous/anxious.     Blood pressure 110/75, pulse 81, weight 137 lb 6.4 oz (62.3 kg), unknown if currently breastfeeding.Body mass index is 23.58 kg/m.  General Appearance: Casual  Eye Contact:  Fair  Speech:  Clear and Coherent  Volume:  Normal  Mood:  Anxious  Affect:  Congruent  Thought Process:  Coherent  Orientation:  Full (Time, Place, and Person)  Thought Content:  WDL  Suicidal Thoughts:  No  Homicidal Thoughts:  No  Memory:  Immediate;   Fair Recent;   Fair Remote;   Fair  Judgement:  Fair  Insight:  Fair  Psychomotor Activity:  Normal  Concentration:  Concentration: Fair and Attention Span: Fair  Recall:  [employer] of Knowledge:Fair  Language: Fair  Akathisia:  No  Handed:  Right  AIMS (if indicated):    Assets:  Communication Skills Desire for Improvement Physical Health Social Support  ADL's:  Intact  Cognition: WNL  Sleep:     Treatment Plan Summary: Medication management   Discussed with patient what the medications. She was advised to take diazepam half to one pill on a when necessary basis and she agreed with the plan.  I will  start her on  Lexapro 5 mg daily for her anxiety and she agreed with the plan.  Follow-up in 4 weeks or earlier depending on her symptoms.   More than 50% of the time spent in psychoeducation, counseling and coordination of care.    This note was generated in part or whole with voice recognition software. Voice regonition is usually quite accurate but there are transcription errors that can and very often do occur. I apologize for any typographical errors that were not detected and corrected.    Rainey Pines, MD 10/5/20171:21 PM

## 2016-01-31 ENCOUNTER — Ambulatory Visit
Admission: RE | Admit: 2016-01-31 | Discharge: 2016-01-31 | Disposition: A | Discharge status: Home / Self Care | Payer: BLUE CROSS/BLUE SHIELD | Source: Ambulatory Visit | Attending: Neurology | Admitting: Neurology

## 2016-01-31 ENCOUNTER — Ambulatory Visit
Admission: EM | Admit: 2016-01-31 | Discharge: 2016-01-31 | Disposition: A | Discharge status: Home / Self Care | Payer: BLUE CROSS/BLUE SHIELD | Attending: Family Medicine | Admitting: Family Medicine

## 2016-01-31 DIAGNOSIS — T7840XA Allergy, unspecified, initial encounter: Secondary | ICD-10-CM | POA: Diagnosis not present

## 2016-01-31 DIAGNOSIS — D497 Neoplasm of unspecified behavior of endocrine glands and other parts of nervous system: Secondary | ICD-10-CM | POA: Insufficient documentation

## 2016-01-31 DIAGNOSIS — R262 Difficulty in walking, not elsewhere classified: Secondary | ICD-10-CM

## 2016-01-31 DIAGNOSIS — R29898 Other symptoms and signs involving the musculoskeletal system: Secondary | ICD-10-CM

## 2016-01-31 MED ORDER — EPINEPHRINE HCL 1 MG/ML IJ SOLN
0.3000 mg | Freq: Once | INTRAMUSCULAR | Status: AC
  Administered 2016-01-31: 0.3 mg via SUBCUTANEOUS

## 2016-01-31 MED ORDER — GADOBENATE DIMEGLUMINE 529 MG/ML IV SOLN: 13.0000 mL | Freq: Once | INTRAVENOUS | Status: DC | PRN

## 2016-01-31 MED ORDER — DIPHENHYDRAMINE HCL 50 MG PO CAPS
50.0000 mg | ORAL_CAPSULE | Freq: Once | ORAL | Status: AC
  Administered 2016-01-31: 50 mg via ORAL

## 2016-01-31 MED ORDER — FAMOTIDINE 40 MG PO TABS
40.0000 mg | ORAL_TABLET | Freq: Once | ORAL | Status: AC
Start: 2016-01-31 — End: 2016-01-31
  Administered 2016-01-31: 40 mg via ORAL

## 2016-01-31 MED ORDER — LORATADINE 10 MG PO TABS: 10.0000 mg | ORAL_TABLET | Freq: Every day | ORAL | 0 refills | Status: DC

## 2016-01-31 MED ORDER — METHYLPREDNISOLONE SODIUM SUCC 125 MG IJ SOLR
125.0000 mg | Freq: Once | INTRAMUSCULAR | Status: AC
  Administered 2016-01-31: 125 mg via INTRAMUSCULAR

## 2016-01-31 MED ORDER — RANITIDINE HCL 150 MG PO CAPS: 150.0000 mg | ORAL_CAPSULE | Freq: Two times a day (BID) | ORAL | 0 refills | Status: DC

## 2016-01-31 NOTE — ED Provider Notes (Signed)
[facility]    CSN: [id number] Arrival date & time: 01/31/16  1425     History   Chief Complaint No chief complaint on file.   HPI Lisa Walker is a 30 y.o. female.   Patient is being brought to the urgent care from imaging at the MRI center because of difficulty swallowing and feeling strange sensation in her throat and chest. She received dye for imaging for her MRI which she started having difficulty with the throat and with the breathing. She was brought to the urgent care for evaluation treatment. Patient does have some difficulty with English as a secondary language but states she does not smoke she's being evaluated for weakness of extremities so they're evaluating her for possible MS. No significant medical problems she doesn't have B12 deficiency vitamin D deficiency insufficiency and she's had a C-section.   The history is provided by the patient and medical records. The history is limited by a language barrier.  Allergic Reaction  Presenting symptoms: difficulty breathing, difficulty swallowing and swelling   Severity:  Moderate Duration:  1 hour Prior allergic episodes:  Food/nut allergies Context: chemicals and medications   Relieved by:  Nothing Worsened by:  Nothing   Past Medical History:  Diagnosis Date  . Medical history non-contributory   . PONV (postoperative nausea and vomiting)     Patient Active Problem List   Diagnosis Date Noted  . B12 deficiency 12/16/2015  . Vitamin D deficiency 12/16/2015  . Difficulty walking 12/12/2015  . Weakness of lower extremity 12/12/2015  . Iron deficiency anemia due to chronic blood loss 12/04/2015  . Vitamin B 12 deficiency 12/04/2015  . Vitamin D insufficiency 12/04/2015  . Indication for care in labor or delivery 09/03/2014  . S/P cesarean section 09/03/2014  . Supervision of normal pregnancy 09/02/2014  . H/O cesarean section complicating pregnancy [id number]    Past Surgical History:  Procedure  Laterality Date  . CESAREAN SECTION N/A 2011   performed in [place]  . CESAREAN SECTION N/A 09/03/2014   Procedure: repeat CESAREAN SECTION;  Surgeon: Aletha Halim, MD;  Location: [facility] ORS;  Service: Obstetrics;  Laterality: N/A;    OB History    Gravida Para Term Preterm AB Living   3 2 1   1 2    SAB TAB Ectopic Multiple Live Births   1     0 2       Home Medications    Prior to Admission medications   Medication Sig Start Date End Date Taking? Authorizing Provider  Cyanocobalamin (RA VITAMIN B-12 TR) 1000 MCG TBCR Take by mouth.    Historical Provider, MD  diazepam (VALIUM) 2 MG tablet Take 1 tablet (2 mg total) by mouth at bedtime as needed for anxiety. Take 1/2 - 1 pill at bed time as needed . 01/30/16   Rainey Pines, MD  escitalopram (LEXAPRO) 5 MG tablet Take 1 tablet (5 mg total) by mouth daily. 01/30/16   Rainey Pines, MD  loratadine (CLARITIN) 10 MG tablet Take 1 tablet (10 mg total) by mouth daily. Take 1 tablet in the morning. As needed for itching. 01/31/16   Frederich Cha, MD  ranitidine (ZANTAC) 150 MG capsule Take 1 capsule (150 mg total) by mouth 2 (two) times daily. 01/31/16   Frederich Cha, MD  Vitamin D, Ergocalciferol, (DRISDOL) 50000 units CAPS capsule  12/12/15   Historical Provider, MD    Family History Family History  Problem Relation Age of Onset  . Diabetes Father  Social History Social History  Substance Use Topics  . Smoking status: Never Smoker  . Smokeless tobacco: Never Used  . Alcohol use No     Allergies   Multihance [gadobenate] and Pineapple   Review of Systems Review of Systems  HENT: Positive for trouble swallowing.   Musculoskeletal: Negative for arthralgias and back pain.  All other systems reviewed and are negative.    Physical Exam Triage Vital Signs ED Triage Vitals  Enc Vitals Group     BP      Pulse      Resp      Temp      Temp src      SpO2      Weight      Height      Head Circumference      Peak Flow      Pain  Score      Pain Loc      Pain Edu?      Excl. in [place]?    No data found.   Updated Vital Signs BP (!) 92/57   Pulse 66   Temp 98.1 F (36.7 C)   Resp 18   SpO2 100%   Visual Acuity Right Eye Distance:   Left Eye Distance:   Bilateral Distance:    Right Eye Near:   Left Eye Near:    Bilateral Near:     Physical Exam  Constitutional: She is oriented to person, place, and time. She appears well-developed and well-nourished.  HENT:  Head: Normocephalic and atraumatic.  Eyes: Pupils are equal, round, and reactive to light.  Neck: Normal range of motion.  Cardiovascular: Normal rate and regular rhythm.   Pulmonary/Chest: Effort normal.  Musculoskeletal: Normal range of motion.  Neurological: She is alert and oriented to person, place, and time.  Skin: Skin is warm and dry. No erythema.  Psychiatric: She has a normal mood and affect.  Vitals reviewed.    UC Treatments / Results  Labs (all labs ordered are listed, but only abnormal results are displayed) Labs Reviewed - No data to display  EKG  EKG Interpretation None       Radiology No results found.  Procedures Procedures (including critical care time)  Medications Ordered in UC Medications  methylPREDNISolone sodium succinate (SOLU-MEDROL) 125 mg/2 mL injection 125 mg (125 mg Intramuscular Given 01/31/16 1432)  EPINEPHrine (ADRENALIN) injection 0.3 mg (0.3 mg Subcutaneous Given 01/31/16 1436)  famotidine (PEPCID) tablet 40 mg (40 mg Oral Given 01/31/16 1436)  diphenhydrAMINE (BENADRYL) capsule 50 mg (50 mg Oral Given 01/31/16 1436)     Initial Impression / Assessment and Plan / UC Course  I have reviewed the triage vital signs and the nursing notes.  Pertinent labs & imaging results that were available during my care of the patient were reviewed by me and considered in my medical decision making (see chart for details).  Clinical Course   Patient was given 50 mg Benadryl by mouth, Pepcid 40 mg and  epinephrine subcutaneous and Solu-Medrol 125 mg IM. She was checked on at least 2 further times by myself. Initially the difficulty breathing through her nostril and mouth clear but she still had some irritation of the trachea area. However by the third time she was seen by me she felt fine and no further trouble.  Final Clinical Impressions(s) / UC Diagnoses   Final diagnoses:  Allergic reaction, initial encounter    New Prescriptions New Prescriptions   LORATADINE (CLARITIN) 10 MG  TABLET    Take 1 tablet (10 mg total) by mouth daily. Take 1 tablet in the morning. As needed for itching.   RANITIDINE (ZANTAC) 150 MG CAPSULE    Take 1 capsule (150 mg total) by mouth 2 (two) times daily.    Point her and her husband L Pl. her on Zantac 150 twice a day for these next 5-7 days Claritin 10 mg. Side middle dose was given I am she last of systems 7 more days and we don't expect any problems with his knee problems go to the ED of her choice. Before patient left a another set of vital signs were taken   Frederich Cha, MD 01/31/16 [id number]

## 2016-02-03 ENCOUNTER — Other Ambulatory Visit: Payer: Self-pay | Admitting: Neurological Surgery

## 2016-02-03 DIAGNOSIS — D497 Neoplasm of unspecified behavior of endocrine glands and other parts of nervous system: Secondary | ICD-10-CM

## 2016-02-04 ENCOUNTER — Ambulatory Visit: Payer: BLUE CROSS/BLUE SHIELD

## 2016-02-04 ENCOUNTER — Ambulatory Visit: Admission: RE | Admit: 2016-02-04 | Payer: BLUE CROSS/BLUE SHIELD | Source: Ambulatory Visit

## 2016-02-04 ENCOUNTER — Ambulatory Visit
Admission: RE | Admit: 2016-02-04 | Discharge: 2016-02-04 | Disposition: A | Discharge status: Home / Self Care | Payer: BLUE CROSS/BLUE SHIELD | Source: Ambulatory Visit | Attending: Neurological Surgery | Admitting: Neurological Surgery

## 2016-02-04 DIAGNOSIS — D497 Neoplasm of unspecified behavior of endocrine glands and other parts of nervous system: Secondary | ICD-10-CM | POA: Insufficient documentation

## 2016-02-04 MED ORDER — IOPAMIDOL (ISOVUE-300) INJECTION 61%
75.0000 mL | Freq: Once | INTRAVENOUS | Status: AC | PRN
  Administered 2016-02-04: 75 mL via INTRAVENOUS

## 2016-02-05 ENCOUNTER — Ambulatory Visit: Payer: BLUE CROSS/BLUE SHIELD | Admitting: Physical Therapy

## 2016-02-05 ENCOUNTER — Encounter: Payer: Self-pay | Admitting: Physical Therapy

## 2016-02-05 DIAGNOSIS — M6281 Muscle weakness (generalized): Secondary | ICD-10-CM

## 2016-02-05 DIAGNOSIS — M79605 Pain in left leg: Secondary | ICD-10-CM

## 2016-02-05 DIAGNOSIS — R269 Unspecified abnormalities of gait and mobility: Secondary | ICD-10-CM

## 2016-02-05 DIAGNOSIS — R2681 Unsteadiness on feet: Secondary | ICD-10-CM

## 2016-02-05 DIAGNOSIS — M79604 Pain in right leg: Secondary | ICD-10-CM

## 2016-02-05 NOTE — Therapy (Signed)
[place] [facility] [address] [place], [place], [address] Phone: [phone]   Fax:  [phone]  Physical Therapy Treatment/Discharge  Patient Details  Name: Lisa Walker MRN: [id number] Date of Birth: 19-Apr-1986 Referring Provider: Gurney Maxin MD  Encounter Date: 02/05/2016      PT End of Session - 02/05/16 1729    Visit Number 13   Number of Visits 15   Date for PT Re-Evaluation 02/04/16   PT Start Time 1600   PT Stop Time [id number]   PT Time Calculation (min) 53 min   Activity Tolerance Patient tolerated treatment well   Behavior During Therapy [facility] for tasks assessed/performed      Past Medical History:  Diagnosis Date  . Medical history non-contributory   . PONV (postoperative nausea and vomiting)     Past Surgical History:  Procedure Laterality Date  . CESAREAN SECTION N/A 2011   performed in [place]  . CESAREAN SECTION N/A 09/03/2014   Procedure: repeat CESAREAN SECTION;  Surgeon: Aletha Halim, MD;  Location: [facility] ORS;  Service: Obstetrics;  Laterality: N/A;    There were no vitals filed for this visit.      Subjective Assessment - 02/05/16 1727    Subjective Pt recieved an MRI of cervical spine and brain. She reports that brain imaging was normal; states that her doctor found a cyst in her neck that she will need surgery for. Says that she will schedule it for sometime in November and expects to have several family members come to assist her after surgery.   Pertinent History pt. reports sudden loss of ability to walk but husband states she is able to walk with CGA into PT clinic.  Pt. reports B quad/ hip pain currently at rest.     Limitations Standing;Walking;House hold activities   How long can you stand comfortably? 5 min   How long can you walk comfortably? 5 min (husband assist since 8/16)   Patient Stated Goals increase B LE muscle strength/ decrease LE pain/ improve gait   Currently in Pain? No/denies      Objective:  Therapeutic Exercise: SciFit Level 7 8 minutes (cool down/no charge). Pistol squat on blue mat table R/L x10. Single leg RDL R/L x10. Resisted gait with Nautilus #60 lateral walk to R x4, to L x4, forwards/backwards #60. Mini squat on BOSU x20 with no UE assist. Single leg Y balance R/L x5. Pt demonstrates no preference for RLE vs LLE with all tasks this session.  Pt response for medical necessity: Pt with no c/o during treatment session; she is concerned about her health due to finding of cyst on cervical spine but is not limited functionally during therapy session. She demonstrates normalized gait pattern and walking speed and performs all tasks with equivalent strength/proprioception in R/L LE. Pt will progress to independent exercise program at this time with emphasis on daily walking routine.       PT Long Term Goals - 02/05/16 1733      PT LONG TERM GOAL #1   Title Pt will score >40/80 on LEFS to promote increase in functional mobility   Baseline 8/17: 26/80 9/12: 53/80 10/11: 71/80   Time 4   Period Weeks   Status Achieved     PT LONG TERM GOAL #2   Title Pt will score >40/56 on BERG to promote increase in amb/standing stability and safety   Baseline 8/17: 32/56 9/12: 56/56   Time 4   Period Weeks  Status Achieved     PT LONG TERM GOAL #3   Title Pt will tolerate >20 minutes of continued standing exercise to promote return to PLOF   Baseline pt able to tolerate <5 minutes currently before requiring seated rest break secondary to weakness and onset of pain 9/12: pt able to ambulate for approx 1 hr   Time 4   Period Weeks   Status Achieved     PT LONG TERM GOAL #4   Title Pt will perform sit<>stand with no UE support with <1/10 pain in LE to promote return of functional mobility   Baseline Pt unable to perform sit<>stand without UE assist; tendency for decreased speed/power. 9/12: sit<>stand no UE assist, no pain    Time 4   Period Weeks   Status Achieved      PT LONG TERM GOAL #5   Title Pt will perform consistent and normalized gait pattern with out tendency for L knee flexion to promote return to PLOF   Baseline pt with intermittent L knee flexion during gait cycle 10/11: pt with no episodes of increased L knee flexion during or following tx session   Time 4   Period Weeks   Status Achieved     PT LONG TERM GOAL #6   Title Pt will perform TUG in the equivalent amount of time leading with LLE as compared to leading with RLE   Baseline 9/12: TUG pt turns R around the cone  9.33 sec, 8.26sec, 7.27 sec; pt turns L around cone L 10.35sec, 10.74 sec, 8.05 sec   Time 4   Period Weeks   Status Achieved               Plan - 02/05/16 1730    Clinical Impression Statement Pt has demonstrated excellent improvement in functional mobility/gait strategy since intial visit. She demonstrates no significant deficits in RLE vs LLE strength/balance during session and continues to complete moderately difficulty balance/strengthening tasks with relative ease. At this time she is independent with her regular exercise program and will continue to practice regular ambulation to maintain her fitness level. Pt will follow up with PT as needed.   Rehab Potential Good   PT Frequency 1x / week   PT Duration 4 weeks   PT Treatment/Interventions ADLs/Self Care Home Management;Gait training;Stair training;Functional mobility training;Therapeutic activities;Therapeutic exercise;Balance training;Patient/family education;Neuromuscular re-education;Manual techniques;Passive range of motion;Energy conservation   PT Next Visit Plan progress toward functional mobility   PT Home Exercise Plan See HEP   Consulted and Agree with Plan of Care Patient;Family member/caregiver   Family Member Consulted Husband      Patient will benefit from skilled therapeutic intervention in order to improve the following deficits and impairments:  Abnormal gait, Improper body mechanics, Pain,  Postural dysfunction, Decreased mobility, Decreased coordination, Decreased activity tolerance, Decreased endurance, Decreased range of motion, Decreased strength, Difficulty walking, Decreased safety awareness, Decreased balance  Visit Diagnosis: Muscle weakness (generalized)  Gait difficulty  Unsteady gait  Pain in right leg  Pain in left leg     Problem List Patient Active Problem List   Diagnosis Date Noted  . B12 deficiency 12/16/2015  . Vitamin D deficiency 12/16/2015  . Difficulty walking 12/12/2015  . Weakness of lower extremity 12/12/2015  . Iron deficiency anemia due to chronic blood loss 12/04/2015  . Vitamin B 12 deficiency 12/04/2015  . Vitamin D insufficiency 12/04/2015  . Indication for care in labor or delivery 09/03/2014  . S/P cesarean section 09/03/2014  .  Supervision of normal pregnancy 09/02/2014  . H/O cesarean section complicating pregnancy [id number]   Pura Spice, PT, DPT # [phone] Mickel Baas Cain Fitzhenry SPT 02/05/2016, 5:36 PM  [facility] [address] [place], [place], [address] Phone: [phone]   Fax:  [phone]  Name: Lisa Walker MRN: [id number] Date of Birth: [birth year]

## 2016-02-12 ENCOUNTER — Encounter: Payer: BLUE CROSS/BLUE SHIELD | Admitting: Physical Therapy

## 2016-02-17 ENCOUNTER — Ambulatory Visit
Admission: RE | Admit: 2016-02-17 | Discharge: 2016-02-17 | Disposition: A | Discharge status: Home / Self Care | Payer: BLUE CROSS/BLUE SHIELD | Source: Ambulatory Visit | Attending: Neurological Surgery | Admitting: Neurological Surgery

## 2016-02-17 DIAGNOSIS — M5124 Other intervertebral disc displacement, thoracic region: Secondary | ICD-10-CM | POA: Diagnosis not present

## 2016-02-17 DIAGNOSIS — M47897 Other spondylosis, lumbosacral region: Secondary | ICD-10-CM | POA: Diagnosis not present

## 2016-02-17 DIAGNOSIS — D497 Neoplasm of unspecified behavior of endocrine glands and other parts of nervous system: Secondary | ICD-10-CM | POA: Diagnosis not present

## 2016-02-21 ENCOUNTER — Other Ambulatory Visit: Payer: Self-pay | Admitting: Neurological Surgery

## 2016-02-21 DIAGNOSIS — D497 Neoplasm of unspecified behavior of endocrine glands and other parts of nervous system: Secondary | ICD-10-CM

## 2016-02-22 ENCOUNTER — Ambulatory Visit
Admission: RE | Admit: 2016-02-22 | Discharge: 2016-02-22 | Disposition: A | Discharge status: Home / Self Care | Payer: BLUE CROSS/BLUE SHIELD | Source: Ambulatory Visit | Attending: Neurological Surgery | Admitting: Neurological Surgery

## 2016-02-22 DIAGNOSIS — D497 Neoplasm of unspecified behavior of endocrine glands and other parts of nervous system: Secondary | ICD-10-CM | POA: Diagnosis not present

## 2016-04-16 DIAGNOSIS — G96198 Other disorders of meninges, not elsewhere classified: Secondary | ICD-10-CM | POA: Insufficient documentation

## 2016-04-16 DIAGNOSIS — Z981 Arthrodesis status: Secondary | ICD-10-CM | POA: Insufficient documentation

## 2016-04-23 ENCOUNTER — Ambulatory Visit: Payer: BLUE CROSS/BLUE SHIELD | Attending: Neurology | Admitting: Physical Therapy

## 2016-04-23 ENCOUNTER — Encounter: Payer: Self-pay | Admitting: Physical Therapy

## 2016-04-23 DIAGNOSIS — M6281 Muscle weakness (generalized): Secondary | ICD-10-CM

## 2016-04-23 DIAGNOSIS — M542 Cervicalgia: Secondary | ICD-10-CM | POA: Diagnosis present

## 2016-04-23 NOTE — Patient Instructions (Signed)
   Page 2-4 of TrA program (no bridging).

## 2016-04-24 NOTE — Therapy (Addendum)
[facility] [address] [place], [place], [address] Phone: [phone]   Fax:  [phone]  Physical Therapy Evaluation  Patient Details  Name: Lisa Walker MRN: [id number] Date of Birth: 17-Mar-1986 Referring Provider: Dr. Jennings Books  Encounter Date: 04/23/2016  1 of 8 treatment sessions.     Past Medical History:  Diagnosis Date  . Medical history non-contributory   . PONV (postoperative nausea and vomiting)     Past Surgical History:  Procedure Laterality Date  . CESAREAN SECTION N/A 2011   performed in [place]  . CESAREAN SECTION N/A 09/03/2014   Procedure: repeat CESAREAN SECTION;  Surgeon: Aletha Halim, MD;  Location: [facility] ORS;  Service: Obstetrics;  Laterality: N/A;    There were no vitals filed for this visit.     Pt. s/p neck surgery 3 weeks ago.  Pt. reports 6/10 neck pain all the time.  Pt. entered PT wearing neck brace and pt. states she is intructed to wear all day except with showers.  Pts. husband is planning on calling surgeon to determine when neck brace can be weaned off.  Pt. referred to PT for L sided muscle weakness.  Pt. states she is doing better with overall strength but has some L LE muscle weakness.     Scifit L 5 10 min. B UE/LE (no increase c/o neck pain)- generalized muscle fatigue/ deconditioning noted.  See HEP.       Pt. is a pleasant 30 y/o female who was referred to PT with L sided muscle weakness.  Pt. known well to PT and presents with marked improvement in walking and s/p recent neck surgery 04/01/16.  Pt. continues to wear neck brace per MD protocol reported per pts./ husband.  PT unable to assess neck at this time.  Pt. presents with normal B UE/LE AROM (all planes).  B UE muscle strength grossly 5/5 MMT except L tricep extension 4+/5 MMT.  R LE muscle strength grossly 5/5 MMT except hip flexion 4+/5 MMT.  L LE muscle strength grossly 4+/5 MMT.  L heel/ big toe sensation diminished (L5 nerve  root).  Pt. ambulates with normalized gait pattern with no assistive device.  Pt. able to complete >30 sec. tandem/ SLS on L/R with no issues.  Pt. will benefit from short-term skilled PT to increase L sided muscle strength/ progress to an independent gym based and ex. program.           PT Long Term Goals - 04/26/16 1820      PT LONG TERM GOAL #1   Title Pt. independent with HEP to increase B UE/LE strength 1/2 muscle grade to    Baseline Pt. presents with normal B UE/LE AROM (all planes).  B UE muscle strength grossly 5/5 MMT except L tricep extension 4+/5 MMT.  R LE muscle strength grossly 5/5 MMT except hip flexion 4+/5 MMT.  L LE muscle strength grossly 4+/5 MMT.    Time 4   Period Weeks   Status New     PT LONG TERM GOAL #2   Title Pt. will reports no L sided fatigue/ muscle weakness as compared to R side of body to improve daily functional mobility.    Baseline L sided weakness as compared to R.     Time 4   Period Weeks   Status New     PT LONG TERM GOAL #3   Title Pt. able to complete 30 minutes of ther.ex. with no increase c/o  pain or limitations to promote return to PLOF.     Baseline pt. not currently exercising or walking due to neck surgery.    Time 4   Period Weeks   Status New        Patient will benefit from skilled therapeutic intervention in order to improve the following deficits and impairments:  Abnormal gait, Improper body mechanics, Pain, Postural dysfunction, Decreased mobility, Decreased coordination, Decreased activity tolerance, Decreased endurance, Decreased range of motion, Decreased strength, Difficulty walking, Decreased safety awareness, Decreased balance  Visit Diagnosis: Muscle weakness (generalized)  Pain in neck     Problem List Patient Active Problem List   Diagnosis Date Noted  . B12 deficiency 12/16/2015  . Vitamin D deficiency 12/16/2015  . Difficulty walking 12/12/2015  . Weakness of lower extremity 12/12/2015  . Iron  deficiency anemia due to chronic blood loss 12/04/2015  . Vitamin B 12 deficiency 12/04/2015  . Vitamin D insufficiency 12/04/2015  . Indication for care in labor or delivery 09/03/2014  . S/P cesarean section 09/03/2014  . Supervision of normal pregnancy 09/02/2014  . H/O cesarean section complicating pregnancy [id number]   Pura Spice, PT, DPT # [id number] 04/26/2016, 6:41 PM  [place] [facility] [address] [place], [place], [address] Phone: [phone]   Fax:  [phone]  Name: Edeline Jaudon MRN: [id number] Date of Birth: 02/01/1986

## 2016-04-26 NOTE — Addendum Note (Signed)
Addended by: Pura Spice on: 04/26/2016 06:49 PM   Modules accepted: Orders

## 2016-04-29 ENCOUNTER — Ambulatory Visit: Payer: BLUE CROSS/BLUE SHIELD | Attending: Neurology | Admitting: Physical Therapy

## 2016-04-29 DIAGNOSIS — R269 Unspecified abnormalities of gait and mobility: Secondary | ICD-10-CM | POA: Insufficient documentation

## 2016-04-29 DIAGNOSIS — M6281 Muscle weakness (generalized): Secondary | ICD-10-CM | POA: Insufficient documentation

## 2016-04-29 DIAGNOSIS — R2681 Unsteadiness on feet: Secondary | ICD-10-CM | POA: Insufficient documentation

## 2016-04-29 DIAGNOSIS — M542 Cervicalgia: Secondary | ICD-10-CM | POA: Diagnosis present

## 2016-04-29 NOTE — Therapy (Signed)
[facility] [address] [place], [place], [address] Phone: [phone]   Fax:  [phone]  Physical Therapy Treatment  Patient Details  Name: Lisa Walker MRN: [id number] Date of Birth: 10-Nov-1985 Referring Provider: Dr. Jennings Books  Encounter Date: 04/29/2016      PT End of Session - 04/29/16 1639    Visit Number 2   Number of Visits 8   Date for PT Re-Evaluation 05/21/16   PT Start Time 1630   PT Stop Time 1724   PT Time Calculation (min) 54 min   Activity Tolerance Patient tolerated treatment well   Behavior During Therapy [facility] for tasks assessed/performed      Past Medical History:  Diagnosis Date  . Medical history non-contributory   . PONV (postoperative nausea and vomiting)     Past Surgical History:  Procedure Laterality Date  . CESAREAN SECTION N/A 2011   performed in [place]  . CESAREAN SECTION N/A 09/03/2014   Procedure: repeat CESAREAN SECTION;  Surgeon: Aletha Halim, MD;  Location: [facility] ORS;  Service: Obstetrics;  Laterality: N/A;    There were no vitals filed for this visit.      Subjective Assessment - 04/29/16 1635    Subjective Pt. reports no changes to neck brace use and still waiting to hear back from MD about precautions.  Pt. states she is getting stronger on L side but the leg "feels heavy".     Limitations Standing;Walking;House hold activities   Patient Stated Goals increase B LE muscle strength   Currently in Pain? Yes   Pain Score 6    Pain Location Neck   Pain Orientation Left;Mid;Lower      LEFS: 41 out of 80.    OBJECTIVE:  There.ex.:  Scifit L6 10 min. B LE only.  BOSU step ups forward/ lateral 10x2 each leg.  Reverse BOSU wt. Shifting/ partial squats 15x (B knee discomfort).  Heel/toe walking 2x in //-bars.  Supine stretches (hamstring both proximal and distal/ gastroc.) 3x each.  Supine bicycle ex./ hip abd. With max. Manual resistance 10x.  Walking lunges at agility ladder 10x2.   Walking in hallway for 4.5 minutes prior to c/o neck/UT discomfort/fatigue.  Reviewed HEP with pt. In depth.  PT encouraged return to walking as tolerated with no increase c/o neck pain.       Pt response for medical necessity:  Pt. Benefits from progressive there.ex. Program to increase L LE muscle strength to promote return to independent mobility without limitations.      PT instructed pt. To contact MD about cervical brace/ restrictions.        PT Long Term Goals - 04/26/16 1820      PT LONG TERM GOAL #1   Title Pt. independent with HEP to increase B UE/LE strength 1/2 muscle grade to    Baseline Pt. presents with normal B UE/LE AROM (all planes).  B UE muscle strength grossly 5/5 MMT except L tricep extension 4+/5 MMT.  R LE muscle strength grossly 5/5 MMT except hip flexion 4+/5 MMT.  L LE muscle strength grossly 4+/5 MMT.    Time 4   Period Weeks   Status New     PT LONG TERM GOAL #2   Title Pt. will reports no L sided fatigue/ muscle weakness as compared to R side of body to improve daily functional mobility.    Baseline L sided weakness as compared to R.     Time 4  Period Weeks   Status New     PT LONG TERM GOAL #3   Title Pt. able to complete 30 minutes of ther.ex. with no increase c/o pain or limitations to promote return to PLOF.     Baseline pt. not currently exercising or walking due to neck surgery.    Time 4   Period Weeks   Status New               Plan - 04/29/16 1639    Clinical Impression Statement Pt. demonstrates very high level balance/ LE stability ex. with use of BOSU/ agility ladder.  Pt. progressing well with LE strengthening ex. program but L LE faitgue/ "heavy feeling" remains present after therex./ squats.  Pt. limited on Scifit due to neck/ UT muscle discomfort.  Pt. instructed to contact MD about neck brace/ precautions.     Rehab Potential Good   PT Frequency 2x / week   PT Duration 4 weeks   PT Treatment/Interventions ADLs/Self Care  Home Management;Gait training;Stair training;Functional mobility training;Therapeutic activities;Therapeutic exercise;Balance training;Patient/family education;Neuromuscular re-education;Manual techniques;Passive range of motion;Energy conservation   PT Next Visit Plan Progress L sided strengthening.  Check for any changes in neck brace use.     PT Home Exercise Plan See HEP   Consulted and Agree with Plan of Care Patient;Family member/caregiver   Family Member Consulted Husband      Patient will benefit from skilled therapeutic intervention in order to improve the following deficits and impairments:  Abnormal gait, Improper body mechanics, Pain, Postural dysfunction, Decreased mobility, Decreased coordination, Decreased activity tolerance, Decreased endurance, Decreased range of motion, Decreased strength, Difficulty walking, Decreased safety awareness, Decreased balance  Visit Diagnosis: Muscle weakness (generalized)  Pain in neck  Gait difficulty  Unsteady gait     Problem List Patient Active Problem List   Diagnosis Date Noted  . B12 deficiency 12/16/2015  . Vitamin D deficiency 12/16/2015  . Difficulty walking 12/12/2015  . Weakness of lower extremity 12/12/2015  . Iron deficiency anemia due to chronic blood loss 12/04/2015  . Vitamin B 12 deficiency 12/04/2015  . Vitamin D insufficiency 12/04/2015  . Indication for care in labor or delivery 09/03/2014  . S/P cesarean section 09/03/2014  . Supervision of normal pregnancy 09/02/2014  . H/O cesarean section complicating pregnancy [id number]   Pura Spice, PT, DPT # [id number] 04/30/2016, 10:10 AM  [facility] [address] [place], [place], [address] Phone: [phone]   Fax:  [phone]  Name: Lisa Walker MRN: [id number] Date of Birth: 09-03-1985

## 2016-05-05 ENCOUNTER — Ambulatory Visit: Payer: BLUE CROSS/BLUE SHIELD | Admitting: Physical Therapy

## 2016-05-05 DIAGNOSIS — M6281 Muscle weakness (generalized): Secondary | ICD-10-CM | POA: Diagnosis not present

## 2016-05-05 NOTE — Therapy (Signed)
[facility] [address] [place], [place], [address] Phone: [phone]   Fax:  [phone]  Physical Therapy Treatment  Patient Details  Name: Lisa Walker MRN: [id number] Date of Birth: 1985/07/11 Referring Provider: Dr. Jennings Books  Encounter Date: 05/05/2016      PT End of Session - 05/05/16 1729    Visit Number 3   Number of Visits 8   Date for PT Re-Evaluation 05/21/16   PT Start Time 1632   PT Stop Time 1727   PT Time Calculation (min) 55 min   Activity Tolerance Patient tolerated treatment well   Behavior During Therapy [facility] for tasks assessed/performed      Past Medical History:  Diagnosis Date  . Medical history non-contributory   . PONV (postoperative nausea and vomiting)     Past Surgical History:  Procedure Laterality Date  . CESAREAN SECTION N/A 2011   performed in [place]  . CESAREAN SECTION N/A 09/03/2014   Procedure: repeat CESAREAN SECTION;  Surgeon: Aletha Halim, MD;  Location: [facility] ORS;  Service: Obstetrics;  Laterality: N/A;    There were no vitals filed for this visit.      Subjective Assessment - 05/05/16 1726    Subjective Pt. reports no changes to neck brace use and still waiting to hear back from MD about precautions. Pt reports increased neck pain due to change in pain medication but was able to complete therapy today. Pt reported no leg pain and is continuing to walk more.     Limitations Standing;Walking;House hold activities   Currently in Pain? No/denies   Pain Score 0-No pain       Ther ex:  Scifit L6 10 min. (B LE only due to neck pain)- no charge/ warm-up Side step ups BOSU with single leg-balance x20 each leg (cuing for posture/ head position)- SBA for safety in //-bars.  Single leg rebound throws 1.5#  Medicine ball x20 each leg (limited by neck discomfort)- progressed to yellow non-wt. Ball.  Walking lunges w/ 2# weights x25 each leg at agility.  Single leg star exercise w/ cone  taps  x20 each leg  Single leg step ups 20x each leg (12" stairs with no UE assist).  Pt. Had fatigue noted at end of tx. And ready to go home.    Pt. Response to tx.:  Benefits from LE strength/ balance progression ex.  Pt. Limited by neck brace and discomfort with certain mvmts. Pattern.  No changes to HEP at this time pending updates on cervical brace/ neck pain.           PT Long Term Goals - 04/26/16 1820      PT LONG TERM GOAL #1   Title Pt. independent with HEP to increase B UE/LE strength 1/2 muscle grade to    Baseline Pt. presents with normal B UE/LE AROM (all planes).  B UE muscle strength grossly 5/5 MMT except L tricep extension 4+/5 MMT.  R LE muscle strength grossly 5/5 MMT except hip flexion 4+/5 MMT.  L LE muscle strength grossly 4+/5 MMT.    Time 4   Period Weeks   Status New     PT LONG TERM GOAL #2   Title Pt. will reports no L sided fatigue/ muscle weakness as compared to R side of body to improve daily functional mobility.    Baseline L sided weakness as compared to R.     Time 4   Period Weeks   Status  New     PT LONG TERM GOAL #3   Title Pt. able to complete 30 minutes of ther.ex. with no increase c/o pain or limitations to promote return to PLOF.     Baseline pt. not currently exercising or walking due to neck surgery.    Time 4   Period Weeks   Status New            Plan - 05/05/16 1730    Clinical Impression Statement Pt. continues to show high level balance/LE stability and strength progression, however limited secondary to neck pain today. Pt showed good hip, knee, and ankle control during all single leg exercises with no loss of balance during treatment. Pt. instructed to contact MD about neck brace and her current pain levels. Pt demonstrated less LE fatigue today during BOSU stability exercises and walking lunges w/ weights.      Rehab Potential Good   PT Frequency 2x / week   PT Duration 4 weeks   PT Treatment/Interventions ADLs/Self Care  Home Management;Gait training;Stair training;Functional mobility training;Therapeutic activities;Therapeutic exercise;Balance training;Patient/family education;Neuromuscular re-education;Manual techniques;Passive range of motion;Energy conservation   PT Next Visit Plan Progress L sided strengthening.  Check for any changes in neck brace use.  Possible discharge from PT with HEP or gym program focus if LE doing well.     PT Home Exercise Plan See HEP   Consulted and Agree with Plan of Care Patient;Family member/caregiver      Patient will benefit from skilled therapeutic intervention in order to improve the following deficits and impairments:  Abnormal gait, Improper body mechanics, Pain, Postural dysfunction, Decreased mobility, Decreased coordination, Decreased activity tolerance, Decreased endurance, Decreased range of motion, Decreased strength, Difficulty walking, Decreased safety awareness, Decreased balance  Visit Diagnosis: Muscle weakness (generalized)     Problem List Patient Active Problem List   Diagnosis Date Noted  . B12 deficiency 12/16/2015  . Vitamin D deficiency 12/16/2015  . Difficulty walking 12/12/2015  . Weakness of lower extremity 12/12/2015  . Iron deficiency anemia due to chronic blood loss 12/04/2015  . Vitamin B 12 deficiency 12/04/2015  . Vitamin D insufficiency 12/04/2015  . Indication for care in labor or delivery 09/03/2014  . S/P cesarean section 09/03/2014  . Supervision of normal pregnancy 09/02/2014  . H/O cesarean section complicating pregnancy [id number]   Pura Spice, PT, DPT # [place], SPT 05/05/2016, 5:52 PM  [place] [address] [place], [place], [address] Phone: [phone]   Fax:  [phone]  Name: Lisa Walker MRN: [id number] Date of Birth: 1985/06/28

## 2016-05-07 ENCOUNTER — Ambulatory Visit: Payer: BLUE CROSS/BLUE SHIELD | Admitting: Physical Therapy

## 2016-05-07 DIAGNOSIS — M542 Cervicalgia: Secondary | ICD-10-CM

## 2016-05-07 DIAGNOSIS — R269 Unspecified abnormalities of gait and mobility: Secondary | ICD-10-CM

## 2016-05-07 DIAGNOSIS — M6281 Muscle weakness (generalized): Secondary | ICD-10-CM | POA: Diagnosis not present

## 2016-05-08 NOTE — Therapy (Signed)
[place] [facility] [address] [place], [place], [address] Phone: [phone]   Fax:  [phone]  Physical Therapy Treatment  Patient Details  Name: Lisa Walker MRN: [id number] Date of Birth: 08/17/1985 Referring Provider: Dr. Jennings Books  Encounter Date: 05/07/2016      PT End of Session - 05/08/16 1444    Visit Number 4   Number of Visits 8   Date for PT Re-Evaluation 05/21/16   PT Start Time [id number]   PT Stop Time 1721   PT Time Calculation (min) 50 min   Activity Tolerance Patient tolerated treatment well   Behavior During Therapy [facility] for tasks assessed/performed      Past Medical History:  Diagnosis Date  . Medical history non-contributory   . PONV (postoperative nausea and vomiting)     Past Surgical History:  Procedure Laterality Date  . CESAREAN SECTION N/A 2011   performed in [place]  . CESAREAN SECTION N/A 09/03/2014   Procedure: repeat CESAREAN SECTION;  Surgeon: Aletha Halim, MD;  Location: [facility] ORS;  Service: Obstetrics;  Laterality: N/A;    There were no vitals filed for this visit.      Subjective Assessment - 05/08/16 1439    Subjective Pt. reports no new issues.  Pt. remains limited with use of neck brace and B UT pain/tightness.  Pt. states she hasn't called MD about neck brace and unsure when brace comes off.  Returns to MD in February.  Pt. reports 4-5/10 neck pain at rest and >7/10 at worst without pain meds.    Limitations Standing;Walking;House hold activities   Patient Stated Goals increase B LE muscle strength   Currently in Pain? Yes   Pain Score 5    Pain Location Neck   Pain Orientation Lower;Right;Left      Ther ex:   Scifit L6 10 min. (B LE only due to neck pain)- no charge/ warm-up Walking lunges w/ 2# weights x25 each leg at agility.  Walking in hallway with consistent cadence/ step pattern 5 min.  Sit to stand 10x (21.2 sec.).  Side stepping with knee flexion 5x (B LE muscle fatigue/  discomfort).   TG knee flexion/ heel raises 30x each.  Reviewed HEP.    Single leg star exercise w/ cone taps  x20 each leg  Single leg step ups 20x each leg (12" stairs with no UE assist).               Pt. Response to tx.:  Benefits from LE strength/ balance progression ex.  Pt. Limited by neck brace and discomfort with certain mvmts. Pattern.  No changes to HEP at this time pending updates on cervical brace/ neck pain.           PT Long Term Goals - 04/26/16 1820      PT LONG TERM GOAL #1   Title Pt. independent with HEP to increase B UE/LE strength 1/2 muscle grade to    Baseline Pt. presents with normal B UE/LE AROM (all planes).  B UE muscle strength grossly 5/5 MMT except L tricep extension 4+/5 MMT.  R LE muscle strength grossly 5/5 MMT except hip flexion 4+/5 MMT.  L LE muscle strength grossly 4+/5 MMT.    Time 4   Period Weeks   Status New     PT LONG TERM GOAL #2   Title Pt. will reports no L sided fatigue/ muscle weakness as compared to R side of body to improve daily  functional mobility.    Baseline L sided weakness as compared to R.     Time 4   Period Weeks   Status New     PT LONG TERM GOAL #3   Title Pt. able to complete 30 minutes of ther.ex. with no increase c/o pain or limitations to promote return to PLOF.     Baseline pt. not currently exercising or walking due to neck surgery.    Time 4   Period Weeks   Status New           Plan - 05/08/16 1445    Clinical Impression Statement Good B LE muscle strength but endurance remains limited with walking/ exercise program.  Pt. presents with 5/5 MMT B LE muscle strength today.  No LE muscle pain but marked fatigue after higher level tasks.  Probable discharge from PT next tx. if no issues with progression to an independent HEP.     Rehab Potential Good   PT Frequency 2x / week   PT Duration 4 weeks   PT Treatment/Interventions ADLs/Self Care Home Management;Gait training;Stair training;Functional mobility  training;Therapeutic activities;Therapeutic exercise;Balance training;Patient/family education;Neuromuscular re-education;Manual techniques;Passive range of motion;Energy conservation   PT Next Visit Plan Possible discharge next visit if no issues.     PT Home Exercise Plan See HEP   Consulted and Agree with Plan of Care Patient;Family member/caregiver   Family Member Consulted Husband      Patient will benefit from skilled therapeutic intervention in order to improve the following deficits and impairments:  Abnormal gait, Improper body mechanics, Pain, Postural dysfunction, Decreased mobility, Decreased coordination, Decreased activity tolerance, Decreased endurance, Decreased range of motion, Decreased strength, Difficulty walking, Decreased safety awareness, Decreased balance  Visit Diagnosis: Muscle weakness (generalized)  Pain in neck  Gait difficulty     Problem List Patient Active Problem List   Diagnosis Date Noted  . B12 deficiency 12/16/2015  . Vitamin D deficiency 12/16/2015  . Difficulty walking 12/12/2015  . Weakness of lower extremity 12/12/2015  . Iron deficiency anemia due to chronic blood loss 12/04/2015  . Vitamin B 12 deficiency 12/04/2015  . Vitamin D insufficiency 12/04/2015  . Indication for care in labor or delivery 09/03/2014  . S/P cesarean section 09/03/2014  . Supervision of normal pregnancy 09/02/2014  . H/O cesarean section complicating pregnancy [id number]   Pura Spice, PT, DPT # [id number] 05/08/2016, 3:01 PM  [place] [facility] [address] [place], [place], [address] Phone: [phone]   Fax:  [phone]  Name: Lisa Walker MRN: [id number] Date of Birth: December 17, 1985

## 2016-05-13 ENCOUNTER — Encounter: Payer: BLUE CROSS/BLUE SHIELD | Admitting: Physical Therapy

## 2016-05-15 ENCOUNTER — Encounter: Payer: BLUE CROSS/BLUE SHIELD | Admitting: Physical Therapy

## 2016-05-19 ENCOUNTER — Ambulatory Visit: Payer: BLUE CROSS/BLUE SHIELD | Admitting: Physical Therapy

## 2016-05-19 DIAGNOSIS — M542 Cervicalgia: Secondary | ICD-10-CM

## 2016-05-19 DIAGNOSIS — R2681 Unsteadiness on feet: Secondary | ICD-10-CM

## 2016-05-19 DIAGNOSIS — M6281 Muscle weakness (generalized): Secondary | ICD-10-CM | POA: Diagnosis not present

## 2016-05-19 DIAGNOSIS — R269 Unspecified abnormalities of gait and mobility: Secondary | ICD-10-CM

## 2016-05-19 NOTE — Therapy (Signed)
[facility] [address] [place], [place], [address] Phone: [phone]   Fax:  [phone]  Physical Therapy Treatment  Patient Details  Name: Lisa Walker MRN: [id number] Date of Birth: Sep 09, 1985 Referring Provider: Dr. Jennings Books  Encounter Date: 05/19/2016      PT End of Session - 05/19/16 1804    Visit Number 5   Number of Visits 8   Date for PT Re-Evaluation 05/21/16   PT Start Time 1632   PT Stop Time 1731   PT Time Calculation (min) 59 min   Activity Tolerance Patient tolerated treatment well   Behavior During Therapy [facility] for tasks assessed/performed      Past Medical History:  Diagnosis Date  . Medical history non-contributory   . PONV (postoperative nausea and vomiting)     Past Surgical History:  Procedure Laterality Date  . CESAREAN SECTION N/A 2011   performed in [place]  . CESAREAN SECTION N/A 09/03/2014   Procedure: repeat CESAREAN SECTION;  Surgeon: Aletha Halim, MD;  Location: [facility] ORS;  Service: Obstetrics;  Laterality: N/A;    There were no vitals filed for this visit.      Subjective Assessment - 05/19/16 1634    Subjective Pt. reports decreased neck pain (3-4/10) and no LE pain. Pt called MD and neck brace will remain until next visit. Pt. went for a 1 mile walk outside today and reported no complaints.    Pertinent History pt. reports sudden loss of ability to walk but husband states she is able to walk with CGA into PT clinic.  Pt. reports B quad/ hip pain currently at rest.     Limitations Standing;Walking;House hold activities   Patient Stated Goals increase B LE muscle strength   Currently in Pain? Yes   Pain Score 3    Pain Location Neck   Pain Type Surgical pain      Therex: TG squats, heel raises 20x each - good knee alignment/no cuing needed  Lateral hops with balance holds 10x - Pt. Able to complete with minor balance checks Single-leg deadlift R/L x10 each side. No LOB, L side  more challenging but able to complete 10 reps with good form.  Reverse BOSU squats 10x2 - able to complete with high level B hip/knee/ankle stability Reverse bosu clocks 2x - good ankle control demonstrated throughout [place] lunge w/ knee flexion 10x each R/L SciFit L8 B UE/LE 10 min. (no charge)    Pt response for medical necessity:  Pt. Has progressed well with no B LE limitations.  B LE muscle strength WNL and pt. Independent with HEP.         PT Long Term Goals - 05/19/16 1711      PT LONG TERM GOAL #1   Title Pt. independent with HEP to increase B UE/LE strength 1/2 muscle grade to    Baseline grossly 5/5 MMT B LE   Time 4   Period Weeks   Status Achieved     PT LONG TERM GOAL #2   Title Pt. will reports no L sided fatigue/ muscle weakness as compared to R side of body to improve daily functional mobility.    Baseline No issues noted   Time 4   Period Weeks   Status Achieved     PT LONG TERM GOAL #3   Title Pt. able to complete 30 minutes of ther.ex. with no increase c/o pain or limitations to promote return to PLOF.  Baseline Pt. able to complete 30 min therex   Time 4   Period Weeks   Status Achieved           Plan - 05/19/16 1805    Clinical Impression Statement Pt. has progressed through high-level balance and LE strengthening programs showing increased balance and 5/5 MMT B LE strength. Pt. demonstrates improved endurance and has met each of her goals and has been educated on keeping up her home walking and exercise program.   Discharge from PT at this time.     Rehab Potential Good   PT Frequency 2x / week   PT Duration 4 weeks   PT Treatment/Interventions ADLs/Self Care Home Management;Gait training;Stair training;Functional mobility training;Therapeutic activities;Therapeutic exercise;Balance training;Patient/family education;Neuromuscular re-education;Manual techniques;Passive range of motion;Energy conservation   PT Next Visit Plan Discharge.  Pt.  instructed to contact PT if any further issues reported.     PT Home Exercise Plan See HEP   Consulted and Agree with Plan of Care Patient;Family member/caregiver   Family Member Consulted Husband      Patient will benefit from skilled therapeutic intervention in order to improve the following deficits and impairments:  Abnormal gait, Improper body mechanics, Pain, Postural dysfunction, Decreased mobility, Decreased coordination, Decreased activity tolerance, Decreased endurance, Decreased range of motion, Decreased strength, Difficulty walking, Decreased safety awareness, Decreased balance  Visit Diagnosis: Muscle weakness (generalized)  Pain in neck  Gait difficulty  Unsteady gait     Problem List Patient Active Problem List   Diagnosis Date Noted  . B12 deficiency 12/16/2015  . Vitamin D deficiency 12/16/2015  . Difficulty walking 12/12/2015  . Weakness of lower extremity 12/12/2015  . Iron deficiency anemia due to chronic blood loss 12/04/2015  . Vitamin B 12 deficiency 12/04/2015  . Vitamin D insufficiency 12/04/2015  . Indication for care in labor or delivery 09/03/2014  . S/P cesarean section 09/03/2014  . Supervision of normal pregnancy 09/02/2014  . H/O cesarean section complicating pregnancy 97/05/6376   Pura Spice, PT, DPT # [address] SPT 05/20/2016, 7:44 AM  [facility] [address] [place], [place], [address] Phone: [phone]   Fax:  [phone]  Name: Lisa Walker MRN: [id number] Date of Birth: 1986/03/13

## 2016-05-20 ENCOUNTER — Encounter: Payer: Self-pay | Admitting: Physical Therapy

## 2016-07-14 ENCOUNTER — Other Ambulatory Visit: Payer: Self-pay | Admitting: Neurological Surgery

## 2016-07-14 DIAGNOSIS — Z981 Arthrodesis status: Secondary | ICD-10-CM

## 2016-07-14 DIAGNOSIS — D492 Neoplasm of unspecified behavior of bone, soft tissue, and skin: Secondary | ICD-10-CM

## 2016-07-23 ENCOUNTER — Ambulatory Visit
Admission: RE | Admit: 2016-07-23 | Discharge: 2016-07-23 | Disposition: A | Discharge status: Home / Self Care | Payer: BLUE CROSS/BLUE SHIELD | Source: Ambulatory Visit | Attending: Neurological Surgery | Admitting: Neurological Surgery

## 2016-07-23 DIAGNOSIS — D492 Neoplasm of unspecified behavior of bone, soft tissue, and skin: Secondary | ICD-10-CM | POA: Diagnosis present

## 2016-07-23 DIAGNOSIS — Z981 Arthrodesis status: Secondary | ICD-10-CM

## 2016-08-29 ENCOUNTER — Ambulatory Visit
Admission: EM | Admit: 2016-08-29 | Discharge: 2016-08-29 | Disposition: A | Discharge status: Home / Self Care | Payer: BLUE CROSS/BLUE SHIELD | Attending: Family Medicine | Admitting: Family Medicine

## 2016-08-29 ENCOUNTER — Encounter: Payer: Self-pay | Admitting: *Deleted

## 2016-08-29 DIAGNOSIS — M7918 Myalgia, other site: Secondary | ICD-10-CM

## 2016-08-29 DIAGNOSIS — M791 Myalgia: Secondary | ICD-10-CM | POA: Diagnosis not present

## 2016-08-29 MED ORDER — TRAMADOL HCL 50 MG PO TABS: 50.0000 mg | ORAL_TABLET | Freq: Three times a day (TID) | ORAL | 0 refills | Status: DC | PRN

## 2016-08-29 MED ORDER — KETOROLAC TROMETHAMINE 60 MG/2ML IM SOLN
60.0000 mg | Freq: Once | INTRAMUSCULAR | Status: AC
  Administered 2016-08-29: 60 mg via INTRAMUSCULAR

## 2016-08-29 MED ORDER — CYCLOBENZAPRINE HCL 10 MG PO TABS: 10.0000 mg | ORAL_TABLET | Freq: Three times a day (TID) | ORAL | 0 refills | Status: DC | PRN

## 2016-08-29 NOTE — Discharge Instructions (Signed)
This is muscular in nature.  Use the muscle relaxant (flexeril) and the pain medication (tramadol) as needed.  Heat, massage, hot shower will also help.  Take care  Dr. Lacinda Axon

## 2016-08-29 NOTE — ED Triage Notes (Signed)
Patient started having left flank pain yesterday AM. Patient unsure of mechanism of injury. No previous history of flank pain and no family history of kidney stones.

## 2016-08-29 NOTE — ED Provider Notes (Signed)
[facility]    CSN: [id number] Arrival date & time: 08/29/16  0803  History   Chief Complaint Chief Complaint  Patient presents with  . Flank Pain   HPI  31 year old female with extensive past medical history including conversion disorder presents with complaints of left-sided posterior rib pain/flank pain.  Started yesterday suddenly. She denies any injury. No complaints of urinary tract symptoms. Pain is severe and worse with lying down. She has relief when she sits up. Husband states that she was given ibuprofen with some minimal improvement. Patient's pain is currently moderate to severe. No radiation of her pain. She has no other associated symptoms. No other complaints or concerns at this time.  PMH - In addition to the problem list below patient has conversion disorder, recent Arachnoid cyst and spinal cord lesion  Patient Active Problem List   Diagnosis Date Noted  . B12 deficiency 12/16/2015  . Vitamin D deficiency 12/16/2015  . Difficulty walking 12/12/2015  . Weakness of lower extremity 12/12/2015  . Iron deficiency anemia due to chronic blood loss 12/04/2015  . Vitamin B 12 deficiency 12/04/2015  . Vitamin D insufficiency 12/04/2015  . Indication for care in labor or delivery 09/03/2014  . S/P cesarean section 09/03/2014  . Supervision of normal pregnancy 09/02/2014  . H/O cesarean section complicating pregnancy [id number]/[id number]   Past Surgical History:  Procedure Laterality Date  . CESAREAN SECTION N/A 2011   performed in [place]  . CESAREAN SECTION N/A 09/03/2014   Procedure: repeat CESAREAN SECTION;  Surgeon: Aletha Halim, MD;  Location: [facility] ORS;  Service: Obstetrics;  Laterality: N/A;   OB History    Gravida Para Term Preterm AB Living   3 2 1   1 2    SAB TAB Ectopic Multiple Live Births   1     0 2     Home Medications    Prior to Admission medications   Medication Sig Start Date End Date Taking? Authorizing Provider  Cyanocobalamin (RA  VITAMIN B-12 TR) 1000 MCG TBCR Take by mouth.   Yes [provider]  ferrous sulfate 325 (65 FE) MG EC tablet Take 325 mg by mouth daily with breakfast.   Yes [provider]  cyclobenzaprine (FLEXERIL) 10 MG tablet Take 1 tablet (10 mg total) by mouth 3 (three) times daily as needed for muscle spasms. 08/29/16   Coral Spikes, DO  diazepam (VALIUM) 2 MG tablet Take 1 tablet (2 mg total) by mouth at bedtime as needed for anxiety. Take 1/2 - 1 pill at bed time as needed . 01/30/16   Rainey Pines, MD  escitalopram (LEXAPRO) 5 MG tablet Take 1 tablet (5 mg total) by mouth daily. 01/30/16   Rainey Pines, MD  loratadine (CLARITIN) 10 MG tablet Take 1 tablet (10 mg total) by mouth daily. Take 1 tablet in the morning. As needed for itching. 01/31/16   Frederich Cha, MD  ranitidine (ZANTAC) 150 MG capsule Take 1 capsule (150 mg total) by mouth 2 (two) times daily. 01/31/16   Frederich Cha, MD  traMADol (ULTRAM) 50 MG tablet Take 1 tablet (50 mg total) by mouth every 8 (eight) hours as needed. 08/29/16   Coral Spikes, DO  Vitamin D, Ergocalciferol, (DRISDOL) 50000 units CAPS capsule  12/12/15   [provider]    Family History Family History  Problem Relation Age of Onset  . Diabetes Father     Social History Social History  Substance Use Topics  . Smoking status:  Never Smoker  . Smokeless tobacco: Never Used  . Alcohol use No     Allergies   Contrast media [iodinated diagnostic agents]; Multihance [gadobenate]; and Pineapple   Review of Systems Review of Systems  Constitutional: Negative.   Musculoskeletal:       Back pain/rib pain.   Physical Exam Triage Vital Signs ED Triage Vitals  Enc Vitals Group     BP 08/29/16 0811 106/70     Pulse Rate 08/29/16 0811 71     Resp 08/29/16 0811 16     Temp 08/29/16 0811 98.4 F (36.9 C)     Temp Source 08/29/16 0811 Oral     SpO2 08/29/16 0811 100 %     Weight 08/29/16 0812 139 lb (63 kg)     Height 08/29/16 0812 5\' 4"   (1.626 m)     Head Circumference --      Peak Flow --      Pain Score 08/29/16 0814 8     Pain Loc --      Pain Edu? --      Excl. in [place]? --    Updated Vital Signs BP 106/70 (BP Location: Left Arm)   Pulse 71   Temp 98.4 F (36.9 C) (Oral)   Resp 16   Ht 5\' 4"  (1.626 m)   Wt 139 lb (63 kg)   LMP 07/31/2016   SpO2 100%   BMI 23.86 kg/m  Physical Exam  Constitutional: She is oriented to person, place, and time. She appears well-developed. No distress.  Cardiovascular: Normal rate and regular rhythm.   Pulmonary/Chest: Effort normal and breath sounds normal.  Musculoskeletal:  Left lateral (lower) rib pain. Exquisitely tender to palpation. Out of proportion to force applied. Patient crying in pain when she lies down.  Neurological: She is alert and oriented to person, place, and time.  Psychiatric:  Flat affect.  Vitals reviewed.  UC Treatments / Results  Labs (all labs ordered are listed, but only abnormal results are displayed) Labs Reviewed - No data to display  EKG  EKG Interpretation None       Radiology No results found.  Procedures Procedures (including critical care time)  Medications Ordered in UC Medications  ketorolac (TORADOL) injection 60 mg (60 mg Intramuscular Given 08/29/16 0828)   Initial Impression / Assessment and Plan / UC Course  I have reviewed the triage vital signs and the nursing notes.  Pertinent labs & imaging results that were available during my care of the patient were reviewed by me and considered in my medical decision making (see chart for details).   31 year old female with a history of conversion disorder presents with complaints of rib/flank pain. She is exquisitely tender. This seems to be out of proportion. This appears to be musculoskeletal in nature. Patient was given a Toradol injection today. She will be discharged home on Flexeril and tramadol.   Final Clinical Impressions(s) / UC Diagnoses   Final diagnoses:    Musculoskeletal pain   New Prescriptions New Prescriptions   CYCLOBENZAPRINE (FLEXERIL) 10 MG TABLET    Take 1 tablet (10 mg total) by mouth 3 (three) times daily as needed for muscle spasms.   TRAMADOL (ULTRAM) 50 MG TABLET    Take 1 tablet (50 mg total) by mouth every 8 (eight) hours as needed.     Thersa Salt [place], [place] 08/29/16 [phone]

## 2016-10-12 ENCOUNTER — Ambulatory Visit
Admission: EM | Admit: 2016-10-12 | Discharge: 2016-10-12 | Disposition: A | Discharge status: Home / Self Care | Payer: BLUE CROSS/BLUE SHIELD | Attending: Emergency Medicine | Admitting: Emergency Medicine

## 2016-10-12 DIAGNOSIS — J020 Streptococcal pharyngitis: Secondary | ICD-10-CM | POA: Diagnosis not present

## 2016-10-12 DIAGNOSIS — R0981 Nasal congestion: Secondary | ICD-10-CM

## 2016-10-12 LAB — RAPID STREP SCREEN (MED CTR MEBANE ONLY): Streptococcus, Group A Screen (Direct): POSITIVE — AB

## 2016-10-12 MED ORDER — AMOXICILLIN 875 MG PO TABS: 875.0000 mg | ORAL_TABLET | Freq: Two times a day (BID) | ORAL | 0 refills | Status: DC

## 2016-10-12 NOTE — Discharge Instructions (Signed)
Take medication as prescribed. Rest. Drink plenty of fluids. Take over the counter tylenol and ibuprofen as needed.    Follow up with your primary care physician this week as needed. Return to Urgent care for new or worsening concerns.

## 2016-10-12 NOTE — ED Triage Notes (Signed)
Patient complains of sore throat, body aches, bilateral ear pain that started yesterday. Patient states that she has also had chills.

## 2016-10-12 NOTE — ED Provider Notes (Signed)
[facility] ____________________________________________  Time seen: Approximately 8:36 AM  I have reviewed the triage vital signs and the nursing notes.   HISTORY  Chief Complaint Sore Throat   HPI Lisa Walker is a 31 y.o. female  presenting for evaluation of sore throat, bilateral ear discomfort, nasal congestion and occasional cough since yesterday. Reports some body aches and cold chills. Denies known fevers. Has not been taking any over-the-counter medications for the same complaints. Reports sore throat is moderate and painful to swallow. Reports continues to drink fluids well and overall eat okay. Denies known sick contacts. Denies home sick contacts. No aggravating or alleviating factors. Reports otherwise feels well.  Denies chest pain, shortness of breath, abdominal pain, dysuria, extremity pain, extremity swelling or rash. Denies recent sickness. Denies recent antibiotic use.   Patient's last menstrual period was 10/02/2016. Denies pregnancy Lovett Sox PCP   Past Medical History:  Diagnosis Date  . Medical history non-contributory   . PONV (postoperative nausea and vomiting)     Patient Active Problem List   Diagnosis Date Noted  . B12 deficiency 12/16/2015  . Vitamin D deficiency 12/16/2015  . Difficulty walking 12/12/2015  . Weakness of lower extremity 12/12/2015  . Iron deficiency anemia due to chronic blood loss 12/04/2015  . Vitamin B 12 deficiency 12/04/2015  . Vitamin D insufficiency 12/04/2015  . Indication for care in labor or delivery 09/03/2014  . S/P cesarean section 09/03/2014  . Supervision of normal pregnancy 09/02/2014  . H/O cesarean section complicating pregnancy 16/01/9603    Past Surgical History:  Procedure Laterality Date  . CESAREAN SECTION N/A 2011   performed in [place]  . CESAREAN SECTION N/A 09/03/2014   Procedure: repeat CESAREAN SECTION;  Surgeon: Aletha Halim, MD;  Location: [facility] ORS;  Service: Obstetrics;   Laterality: N/A;     No current facility-administered medications for this encounter.   Current Outpatient Prescriptions:  .  Cyanocobalamin (RA VITAMIN B-12 TR) 1000 MCG TBCR, Take by mouth., Disp: , Rfl:  .  ferrous sulfate 325 (65 FE) MG EC tablet, Take 325 mg by mouth daily with breakfast., Disp: , Rfl:  .  loratadine (CLARITIN) 10 MG tablet, Take 1 tablet (10 mg total) by mouth daily. Take 1 tablet in the morning. As needed for itching., Disp: 30 tablet, Rfl: 0 .  ranitidine (ZANTAC) 150 MG capsule, Take 1 capsule (150 mg total) by mouth 2 (two) times daily., Disp: 30 capsule, Rfl: 0 .  Vitamin D, Ergocalciferol, (DRISDOL) 50000 units CAPS capsule, , Disp: , Rfl:  .  amoxicillin (AMOXIL) 875 MG tablet, Take 1 tablet (875 mg total) by mouth 2 (two) times daily., Disp: 20 tablet, Rfl: 0 .  cyclobenzaprine (FLEXERIL) 10 MG tablet, Take 1 tablet (10 mg total) by mouth 3 (three) times daily as needed for muscle spasms., Disp: 30 tablet, Rfl: 0 .  diazepam (VALIUM) 2 MG tablet, Take 1 tablet (2 mg total) by mouth at bedtime as needed for anxiety. Take 1/2 - 1 pill at bed time as needed ., Disp: 30 tablet, Rfl: 0 .  escitalopram (LEXAPRO) 5 MG tablet, Take 1 tablet (5 mg total) by mouth daily., Disp: 30 tablet, Rfl: 0 .  traMADol (ULTRAM) 50 MG tablet, Take 1 tablet (50 mg total) by mouth every 8 (eight) hours as needed., Disp: 20 tablet, Rfl: 0  Allergies Contrast media [iodinated diagnostic agents]; Multihance [gadobenate]; and Pineapple  Family History  Problem Relation Age of Onset  . Diabetes Father  Social History Social History  Substance Use Topics  . Smoking status: Never Smoker  . Smokeless tobacco: Never Used  . Alcohol use No    Review of Systems Constitutional: As above.  Eyes: No visual changes. ENT: Positive sore throat. Cardiovascular: Denies chest pain. Respiratory: Denies shortness of breath. Gastrointestinal: No abdominal pain.  No nausea, no vomiting.  No  diarrhea.  No constipation. Genitourinary: Negative for dysuria. Musculoskeletal: Negative for back pain. Skin: Negative for rash.  ____________________________________________   PHYSICAL EXAM:  VITAL SIGNS: ED Triage Vitals  Enc Vitals Group     BP 10/12/16 0820 105/68     Pulse Rate 10/12/16 0820 95     Resp 10/12/16 0820 18     Temp 10/12/16 0820 98.5 F (36.9 C)     Temp Source 10/12/16 0820 Oral     SpO2 10/12/16 0820 100 %     Weight 10/12/16 0817 139 lb (63 kg)     Height 10/12/16 0817 5\' 4"  (1.626 m)     Head Circumference --      Peak Flow --      Pain Score 10/12/16 0818 8     Pain Loc --      Pain Edu? --      Excl. in [place]? --    Constitutional: Alert and oriented. Well appearing and in no acute distress. Eyes: Conjunctivae are normal. PERRL. EOMI. Head: Atraumatic. No sinus tenderness to palpation. No swelling. No erythema.  Ears: no erythema, normal TMs bilaterally.   Nose:Nasal congestion with clear rhinorrhea  Mouth/Throat: Mucous membranes are moist. Moderate pharyngeal erythema. No tonsillar swelling or exudate.  Neck: No stridor.  No cervical spine tenderness to palpation. Hematological/Lymphatic/Immunilogical: Anterior bilateral cervical lymphadenopathy. Cardiovascular: Normal rate, regular rhythm. Grossly normal heart sounds.  Good peripheral circulation. Respiratory: Normal respiratory effort.  No retractions. No wheezes, rales or rhonchi. Good air movement.  Gastrointestinal: Soft and nontender.  Musculoskeletal: Ambulatory with steady gait. No cervical, thoracic or lumbar tenderness to palpation. Neurologic:  Normal speech and language. No gait instability. Skin:  Skin appears warm, dry. Psychiatric: Mood and affect are normal. Speech and behavior are normal.   ___________________________________________   LABS (all labs ordered are listed, but only abnormal results are displayed)  Labs Reviewed  RAPID STREP SCREEN (NOT AT [facility]) - Abnormal;  Notable for the following:       Result Value   Streptococcus, Group A Screen (Direct) POSITIVE (*)    All other components within normal limits   ____________________________________________   PROCEDURES Procedures    INITIAL IMPRESSION / ASSESSMENT AND PLAN / ED COURSE  Pertinent labs & imaging results that were available during my care of the patient were reviewed by me and considered in my medical decision making (see chart for details).  Well appearing patient. No acute distress. Moderate pharyngeal erythema, will evaluate strep. Will treat with oral Amoxicillin. Encouraged supportive care, rest, fluids, over-the-counter Tylenol or ibuprofen as needed. Discussed indication, risks and benefits of medications with patient.  Discussed follow up with Primary care physician this week. Discussed follow up and return parameters including no resolution or any worsening concerns. Patient verbalized understanding and agreed to plan.   ____________________________________________   FINAL CLINICAL IMPRESSION(S) / ED DIAGNOSES  Final diagnoses:  Strep pharyngitis  Nasal congestion     Discharge Medication List as of 10/12/2016  8:56 AM    START taking these medications   Details  amoxicillin (AMOXIL) 875 MG tablet Take 1 tablet (500  mg total) by mouth 2 (two) times daily., Starting Mon 10/12/2016, Normal        Note: This dictation was prepared with Dragon dictation along with smaller phrase technology. Any transcriptional errors that result from this process are unintentional.         Marylene Land, NP 10/12/16 [phone]

## 2016-11-10 IMAGING — MR MR LUMBAR SPINE W/O CM
4 of 5 series · 15 of 48 positions shown · non-contrast
Comparison: MRI of the brain, cervical spine, thoracic spine.

CLINICAL DATA: Intradural extramedullary spinal cord neoplasm

EXAM:
MRI LUMBAR SPINE WITHOUT CONTRAST
TECHNIQUE: Multiplanar, multisequence MR imaging of the lumbar spine was
performed. No intravenous contrast was administered.

[Series 4: T2 · sagittal · 4.0mm · 0.44mm/px · 6 of 16 slices shown (1 of 2)]
[im 1/16]
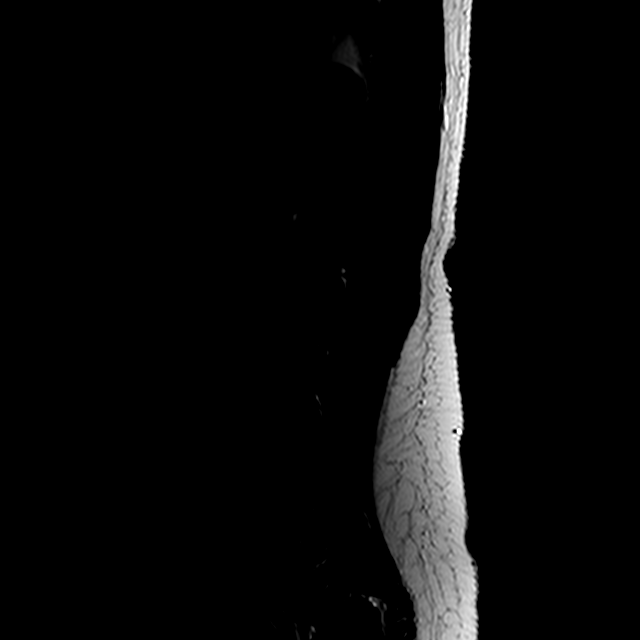
[im 4/16]
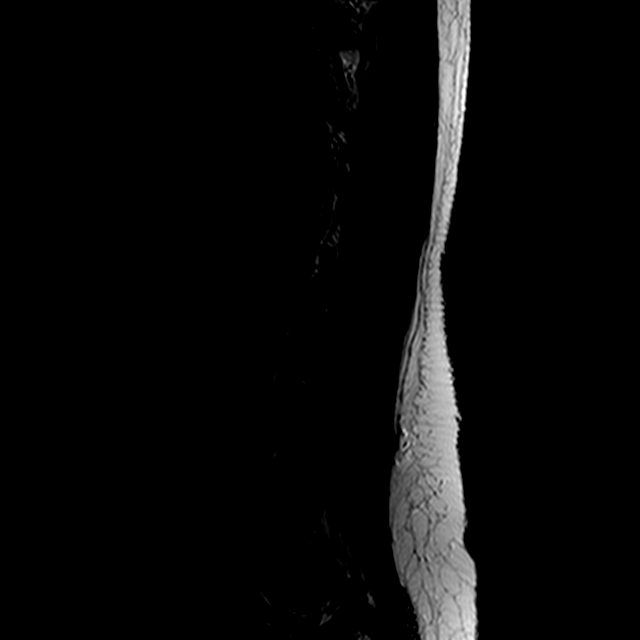
[im 7/16]
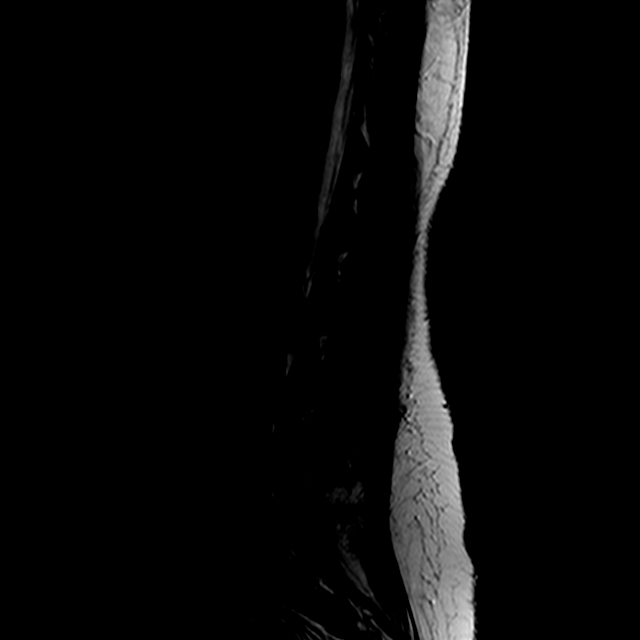
[im 10/16]
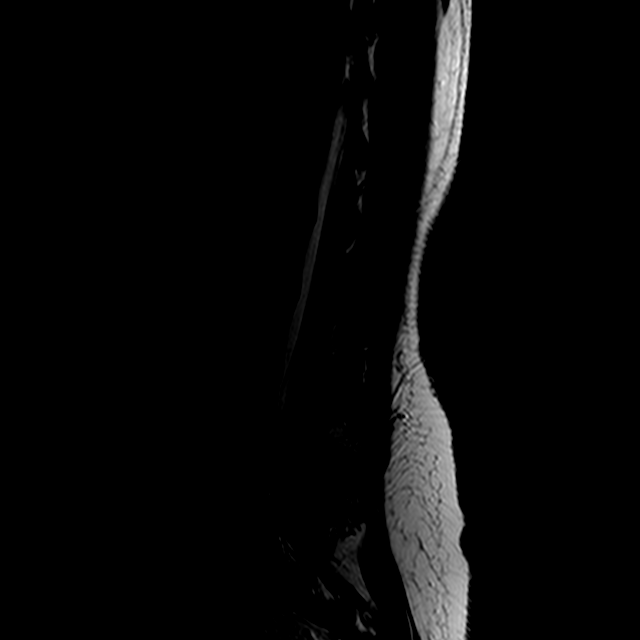
[im 13/16]
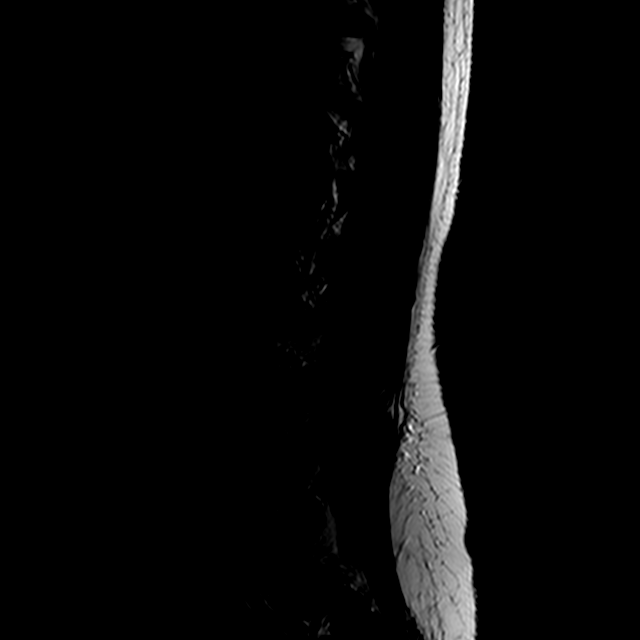
[im 16/16]
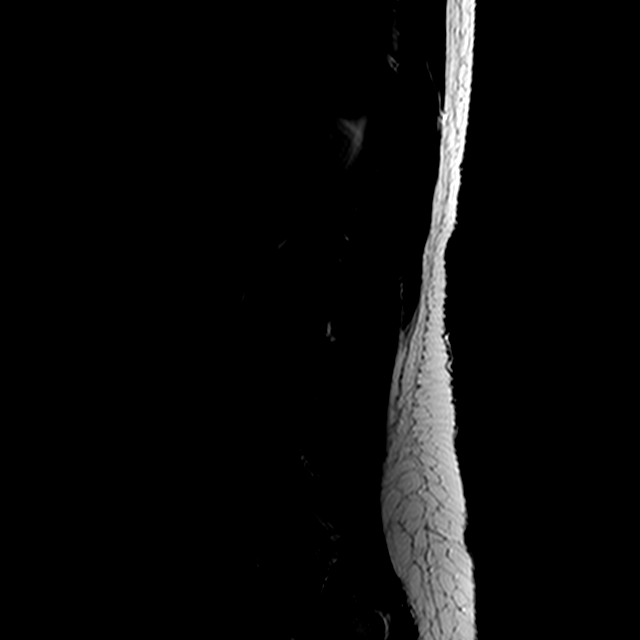

[Series 5: T1 · sagittal · 4.0mm · 0.44mm/px · 3 of 16 slices shown (1 of 2)]
[im 4/16]
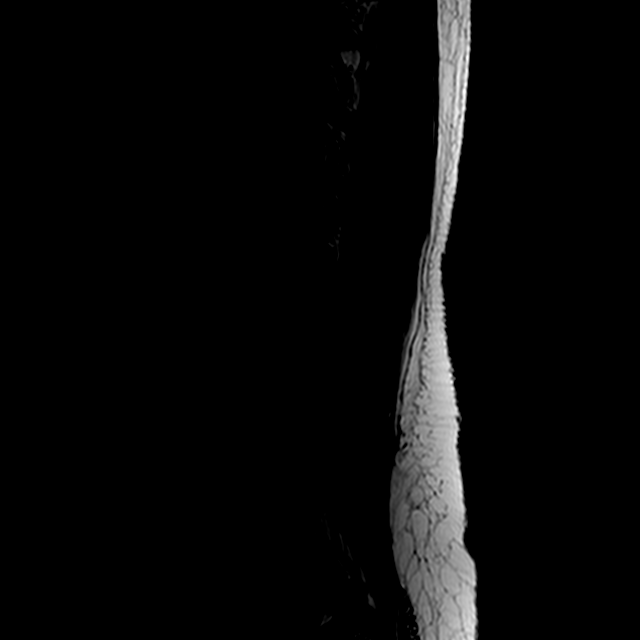
[im 10/16]
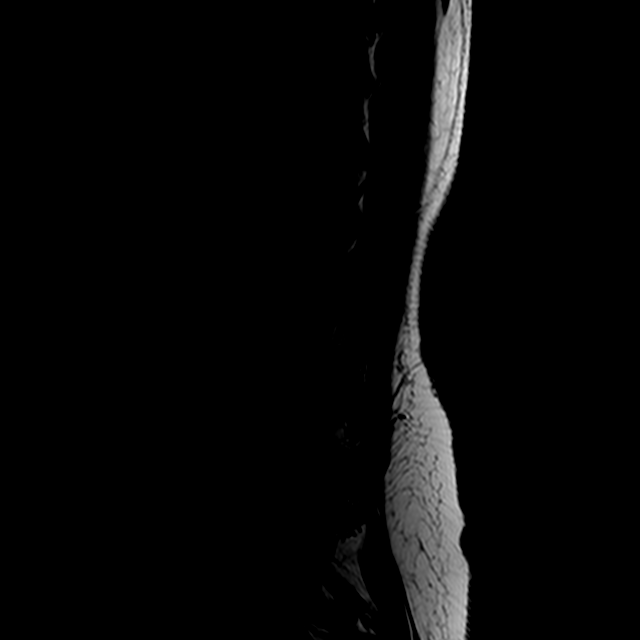
[im 16/16]
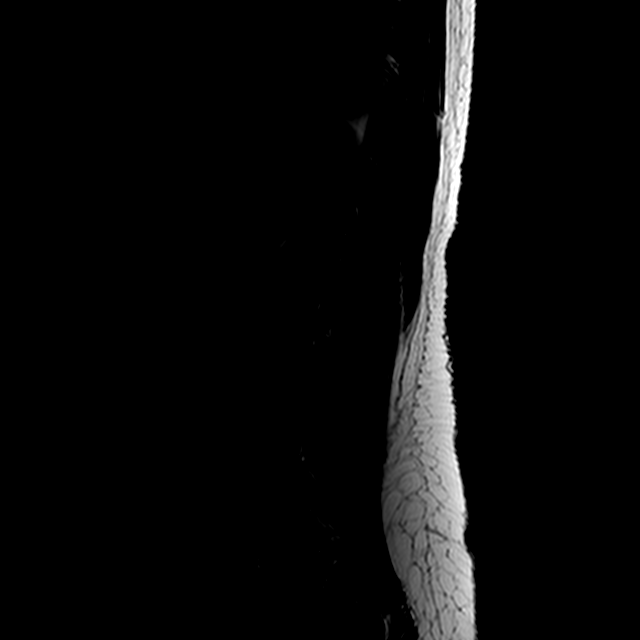

[Series 7: T2 · axial · 4.0mm · 0.39mm/px · z∈[-121,+40]mm · 3 of 39 slices shown (2 of 2)]
[im 6/39]
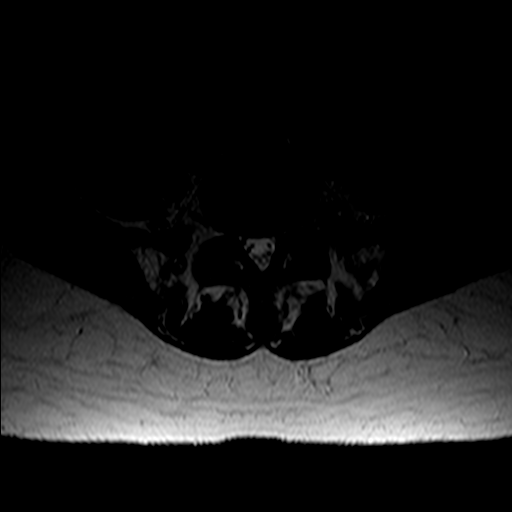
[im 20/39]
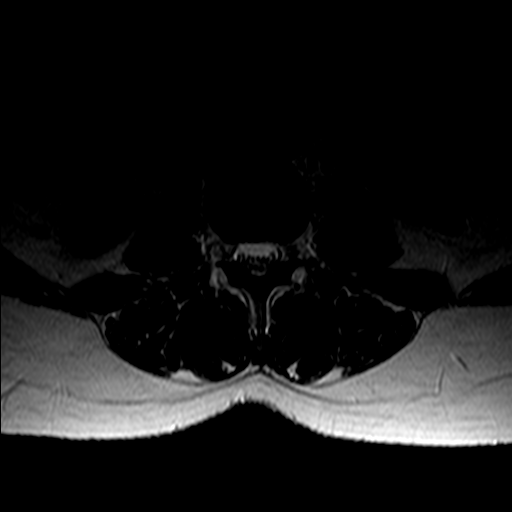
[im 33/39]
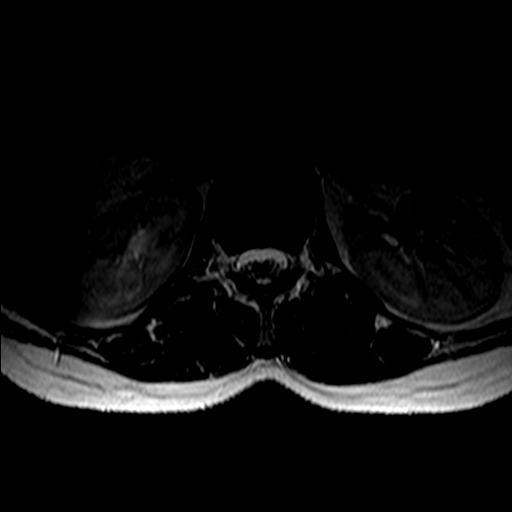

[Series 8: T1 · axial · 4.0mm · 0.39mm/px · z∈[-121,+40]mm · 3 of 39 slices shown (2 of 2)]
[im 6/39]
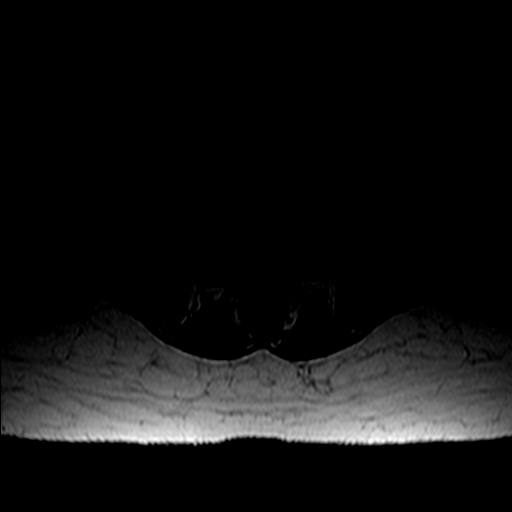
[im 20/39]
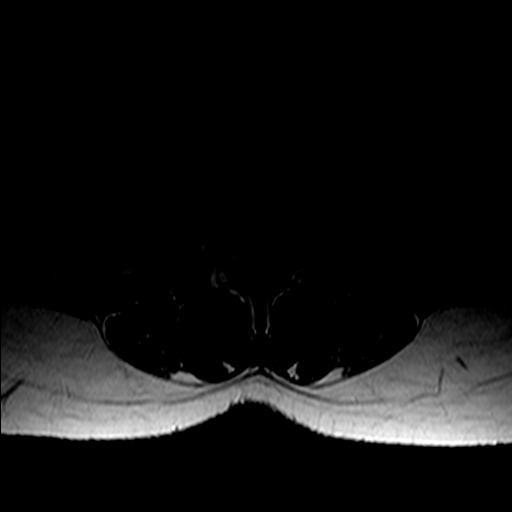
[im 33/39]
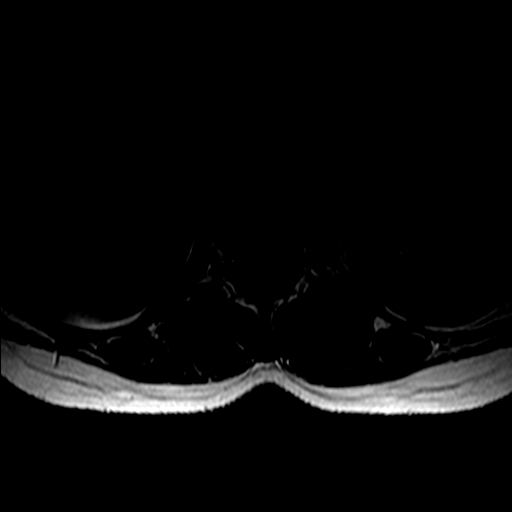

[15 of 48 positions shown; findings below may reference images not displayed]

FINDINGS: Segmentation:  Normal

Alignment:  Normal

Vertebrae:  Normal vertebral bodies.  Normal bone marrow.

Conus medullaris: Extends to the L1-2 level and appears normal.

Paraspinal and other soft tissues: Paraspinous muscles normal.
Retroperitoneal structures normal.

Disc levels:

L1-2:  Negative

L2-3:  Negative

L3-4:  Negative

L4-5:  Negative

L5-S1: Mild disc degeneration. Small central disc protrusion.
Negative for neural impingement or stenosis
IMPRESSION: Mild degenerative changes at L5-S1.  Otherwise negative.

## 2016-12-10 ENCOUNTER — Inpatient Hospital Stay: Payer: BLUE CROSS/BLUE SHIELD | Attending: Oncology | Admitting: Oncology

## 2016-12-10 ENCOUNTER — Encounter: Payer: Self-pay | Admitting: Oncology

## 2016-12-10 ENCOUNTER — Inpatient Hospital Stay: Payer: BLUE CROSS/BLUE SHIELD

## 2016-12-10 VITALS — BP 95/63 | HR 71 | Temp 96.7°F | Ht 62.5 in | Wt 147.9 lb

## 2016-12-10 DIAGNOSIS — D500 Iron deficiency anemia secondary to blood loss (chronic): Secondary | ICD-10-CM | POA: Insufficient documentation

## 2016-12-10 DIAGNOSIS — N920 Excessive and frequent menstruation with regular cycle: Secondary | ICD-10-CM | POA: Insufficient documentation

## 2016-12-10 DIAGNOSIS — E538 Deficiency of other specified B group vitamins: Secondary | ICD-10-CM | POA: Insufficient documentation

## 2016-12-10 DIAGNOSIS — E559 Vitamin D deficiency, unspecified: Secondary | ICD-10-CM | POA: Diagnosis not present

## 2016-12-10 DIAGNOSIS — Z79899 Other long term (current) drug therapy: Secondary | ICD-10-CM | POA: Insufficient documentation

## 2016-12-10 NOTE — Progress Notes (Signed)
Patient here today as a new patient  

## 2016-12-10 NOTE — Progress Notes (Addendum)
[facility] Initial consultation  Patient Care Team: Lovett Sox as PCP - General (Physician Assistant)  CHIEF COMPLAINTS/PURPOSE OF CONSULTATION: I have iron deficiency  HISTORY OF PRESENTING ILLNESS: Lisa Walker 31 y.o. [place] female with past medical history listed as below is referred by primary care provider to me for evaluation and management of iron deficiency anemia. Patient speaks English fluently and she tells me she is able to understand me well. Her husband came to the room and joined our conversation later. Patient reports having history of heavy menstrual period. She also had 2 termination of pregnancy. She was found to have iron deficiency anemia and has been on iron supplementation orally for the past year. However her iron store does not improve too much. Patient is still feels very fatigue and weakness. 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 She had the surgery for resection of the cyst and currently her neurologic symptoms are better. She is married and has 2 kids, youngest kid is 61 years old. She is not breast feeding. She is not a vegetarian and she consume red meat on  regular basis. Denies any family history of cancer.  Denies any stomach discomfort, chest pain, shortness of breath, abdominal pain, black stool, or blood in the stool. Denies any weight loss, fever or chills,. Reports fatigue, finger bone pain.  Review of Systems  Constitutional: Positive for fatigue.  HENT:  Negative.   Eyes: Negative.   Respiratory: Negative.   Cardiovascular: Negative.   Gastrointestinal: Negative.   Endocrine: Negative.   Genitourinary: Negative.    Musculoskeletal: Negative.   Skin: Negative.   Neurological: Negative.   Hematological: Negative.   Psychiatric/Behavioral: Negative.     MEDICAL HISTORY: Past Medical History:  Diagnosis Date  . Medical  history non-contributory   . PONV (postoperative nausea and vomiting)     SURGICAL HISTORY: Past Surgical History:  Procedure Laterality Date  . CESAREAN SECTION N/A 2011   performed in [place]  . CESAREAN SECTION N/A 09/03/2014   Procedure: repeat CESAREAN SECTION;  Surgeon: Aletha Halim, MD;  Location: [facility] ORS;  Service: Obstetrics;  Laterality: N/A;    SOCIAL HISTORY: Social History   Social History  . Marital status: Married    Spouse name: N/A  . Number of children: N/A  . Years of education: N/A   Occupational History  . Not on file.   Social History Main Topics  . Smoking status: Never Smoker  . Smokeless tobacco: Never Used  . Alcohol use No  . Drug use: No  . Sexual activity: Yes    Birth control/ protection: None   Other Topics Concern  . Not on file   Social History Narrative  . No narrative on file    FAMILY HISTORY Family History  Problem Relation Age of Onset  . Diabetes Father     ALLERGIES:  is allergic to contrast media [iodinated diagnostic agents]; multihance [gadobenate]; and pineapple.  MEDICATIONS:  Current Outpatient Prescriptions  Medication Sig Dispense Refill  . amoxicillin (AMOXIL) 875 MG tablet Take 1 tablet (875 mg total) by mouth 2 (two) times daily. 20 tablet 0  . Cyanocobalamin (RA VITAMIN B-12 TR) 1000 MCG TBCR Take by mouth.    . cyclobenzaprine (FLEXERIL) 10 MG tablet Take 1 tablet (10 mg total) by mouth 3 (three) times daily as needed for muscle spasms. 30 tablet 0  . diazepam (  VALIUM) 2 MG tablet Take 1 tablet (2 mg total) by mouth at bedtime as needed for anxiety. Take 1/2 - 1 pill at bed time as needed . 30 tablet 0  . escitalopram (LEXAPRO) 5 MG tablet Take 1 tablet (5 mg total) by mouth daily. 30 tablet 0  . ferrous sulfate 325 (65 FE) MG EC tablet Take 325 mg by mouth daily with breakfast.    . loratadine (CLARITIN) 10 MG tablet Take 1 tablet (10 mg total) by mouth daily. Take 1 tablet in the morning. As needed for  itching. 30 tablet 0  . ranitidine (ZANTAC) 150 MG capsule Take 1 capsule (150 mg total) by mouth 2 (two) times daily. 30 capsule 0  . traMADol (ULTRAM) 50 MG tablet Take 1 tablet (50 mg total) by mouth every 8 (eight) hours as needed. 20 tablet 0  . Vitamin D, Ergocalciferol, (DRISDOL) 50000 units CAPS capsule      No current facility-administered medications for this visit.     PHYSICAL EXAMINATION:  ECOG PERFORMANCE STATUS: 0 - Asymptomatic   Vitals:   12/10/16 0929  BP: 95/63  Pulse: 71  Temp: (!) 96.7 F (35.9 C)    Filed Weights   12/10/16 0929  Weight: 147 lb 14.9 oz (67.1 kg)     Physical Exam GENERAL: No distress, well nourished.  SKIN:  No rashes or significant lesions  HEAD: Normocephalic, No masses, lesions, tenderness or abnormalities  EYES: Conjunctiva are pale, non icteric ENT: External ears normal ,lips , buccal mucosa, and tongue normal and mucous membranes are moist  LYMPH: No palpable cervical and axillary lymphadenopathy  LUNGS: Clear to auscultation, no crackles or wheezes HEART: Regular rate & rhythm, no murmurs, no gallops, S1 normal and S2 normal  ABDOMEN: Abdomen soft, non-tender, normal bowel sounds, I did not appreciate any  masses or organomegaly  MUSCULOSKELETAL: No CVA tenderness and no tenderness on percussion of the back or rib cage.  EXTREMITIES: No edema, no skin discoloration or tenderness NEURO: Alert & oriented, no focal motor/sensory deficits.    LABORATORY DATA: I have personally reviewed the data as listed: Labs from [place] showed 11/13/2016 ferritin 8 iron 39 TIBC 384 transferrin saturation 10. CBC hemoglobin 11.2 WBC 5.6 hematocrit 34 platelets 254 MCV 81.  No visits with results within 1 Month(s) from this visit.  Latest known visit with results is:  Admission on 10/12/2016, Discharged on 10/12/2016  Component Date Value Ref Range Status  . Streptococcus, Group A Screen (Dir* 10/12/2016 POSITIVE* NEGATIVE Final     RADIOGRAPHIC STUDIES: I have personally reviewed the radiological images as listed and agree with the findings in the report No recent images No results found.  ASSESSMENT/PLAN 1. Iron deficiency anemia due to chronic blood loss   2. B12 deficiency   3. Vitamin D deficiency   Iron deficiency Anemia: Likely from her previous history of heavy menstrual bleeding, and previous pregnancies/lactation. Will check fecal occult, celiac panel. Check B12 level, and 25-hydroxy vitamin D level Discussed with patient about option of IV iron with Feraheme 510 mg weekly 2 doses. Side effects including severe allergic reactions/anaphylaxis were discussed with patient and her husband. They voices understanding and willing to proceed.  Follow-up in 2 months with CBC, iron TIBC, ferritin. All questions were answered. The patient knows to call the clinic with any problems, questions or concerns. Thank you for this kind referral and the opportunity to participate in the care of this patient. A copy of today's note  is routed to referring provider Colbert Ewing.  Earlie Server, MD  12/10/2016 8:26 AM

## 2016-12-11 DIAGNOSIS — D500 Iron deficiency anemia secondary to blood loss (chronic): Secondary | ICD-10-CM | POA: Diagnosis not present

## 2016-12-11 LAB — VITAMIN D 25 HYDROXY (VIT D DEFICIENCY, FRACTURES): VIT D 25 HYDROXY: 27.7 ng/mL — AB (ref 30.0–100.0)

## 2016-12-11 LAB — VITAMIN B12: VITAMIN B 12: 699 pg/mL (ref 180–914)

## 2016-12-12 DIAGNOSIS — D500 Iron deficiency anemia secondary to blood loss (chronic): Secondary | ICD-10-CM | POA: Diagnosis not present

## 2016-12-12 LAB — CELIAC PANEL 10
Antigliadin Abs, IgA: 5 units (ref 0–19)
Endomysial Ab, IgA: NEGATIVE
GLIADIN IGG: 2 U (ref 0–19)
IgA: 332 mg/dL (ref 87–352)
Tissue Transglut Ab: <2 U/mL (ref 0–5)

## 2016-12-13 DIAGNOSIS — D500 Iron deficiency anemia secondary to blood loss (chronic): Secondary | ICD-10-CM | POA: Diagnosis not present

## 2016-12-15 ENCOUNTER — Inpatient Hospital Stay: Payer: BLUE CROSS/BLUE SHIELD

## 2016-12-15 LAB — OCCULT BLOOD X 1 CARD TO LAB, STOOL
FECAL OCCULT BLD: NEGATIVE
FECAL OCCULT BLD: NEGATIVE
Fecal Occult Bld: NEGATIVE

## 2016-12-18 ENCOUNTER — Inpatient Hospital Stay: Payer: BLUE CROSS/BLUE SHIELD

## 2016-12-18 VITALS — BP 101/66 | HR 60 | Temp 95.3°F | Resp 18

## 2016-12-18 DIAGNOSIS — D500 Iron deficiency anemia secondary to blood loss (chronic): Secondary | ICD-10-CM

## 2016-12-18 MED ORDER — SODIUM CHLORIDE 0.9 % IV SOLN
510.0000 mg | Freq: Once | INTRAVENOUS | Status: AC
  Administered 2016-12-18: 510 mg via INTRAVENOUS
  Filled 2016-12-18: qty 17

## 2016-12-18 MED ORDER — SODIUM CHLORIDE 0.9 % IV SOLN
Freq: Once | INTRAVENOUS | Status: AC
  Administered 2016-12-18: 11:00:00 via INTRAVENOUS
  Filled 2016-12-18: qty 1000

## 2016-12-18 MED ORDER — FERUMOXYTOL INJECTION 510 MG/17 ML
INTRAVENOUS | Status: AC
  Filled 2016-12-18: qty 17

## 2016-12-18 NOTE — Patient Instructions (Signed)

## 2016-12-25 ENCOUNTER — Inpatient Hospital Stay: Payer: BLUE CROSS/BLUE SHIELD

## 2016-12-25 DIAGNOSIS — D500 Iron deficiency anemia secondary to blood loss (chronic): Secondary | ICD-10-CM | POA: Diagnosis not present

## 2016-12-25 LAB — CBC WITH DIFFERENTIAL/PLATELET
Basophils Absolute: 0 10*3/uL (ref 0–0.1)
Basophils Relative: 1 %
EOS PCT: 8 %
Eosinophils Absolute: 0.5 10*3/uL (ref 0–0.7)
HEMATOCRIT: 36.5 % (ref 35.0–47.0)
Hemoglobin: 12.1 g/dL (ref 12.0–16.0)
LYMPHS ABS: 1.9 10*3/uL (ref 1.0–3.6)
LYMPHS PCT: 30 %
MCH: 27.5 pg (ref 26.0–34.0)
MCHC: 33.2 g/dL (ref 32.0–36.0)
MCV: 82.7 fL (ref 80.0–100.0)
MONO ABS: 0.5 10*3/uL (ref 0.2–0.9)
Monocytes Relative: 7 %
NEUTROS ABS: 3.5 10*3/uL (ref 1.4–6.5)
Neutrophils Relative %: 54 %
PLATELETS: 204 10*3/uL (ref 150–440)
RBC: 4.41 MIL/uL (ref 3.80–5.20)
RDW: 14.7 % — ABNORMAL HIGH (ref 11.5–14.5)
WBC: 6.5 10*3/uL (ref 3.6–11.0)

## 2016-12-25 LAB — IRON AND TIBC
IRON: 145 ug/dL (ref 28–170)
Saturation Ratios: 47 % — ABNORMAL HIGH (ref 10.4–31.8)
TIBC: 310 ug/dL (ref 250–450)
UIBC: 165 ug/dL

## 2016-12-25 LAB — FERRITIN: Ferritin: 406 ng/mL — ABNORMAL HIGH (ref 11–307)

## 2016-12-31 ENCOUNTER — Inpatient Hospital Stay: Payer: BLUE CROSS/BLUE SHIELD | Attending: Oncology | Admitting: Oncology

## 2016-12-31 ENCOUNTER — Encounter: Payer: Self-pay | Admitting: Oncology

## 2016-12-31 ENCOUNTER — Inpatient Hospital Stay: Payer: BLUE CROSS/BLUE SHIELD

## 2016-12-31 VITALS — BP 102/69 | HR 62 | Temp 96.2°F | Resp 16 | Wt 145.9 lb

## 2016-12-31 DIAGNOSIS — Z79899 Other long term (current) drug therapy: Secondary | ICD-10-CM

## 2016-12-31 DIAGNOSIS — R600 Localized edema: Secondary | ICD-10-CM | POA: Diagnosis not present

## 2016-12-31 DIAGNOSIS — E559 Vitamin D deficiency, unspecified: Secondary | ICD-10-CM | POA: Diagnosis not present

## 2016-12-31 DIAGNOSIS — N920 Excessive and frequent menstruation with regular cycle: Secondary | ICD-10-CM | POA: Diagnosis not present

## 2016-12-31 DIAGNOSIS — D500 Iron deficiency anemia secondary to blood loss (chronic): Secondary | ICD-10-CM | POA: Insufficient documentation

## 2016-12-31 DIAGNOSIS — D649 Anemia, unspecified: Secondary | ICD-10-CM

## 2016-12-31 DIAGNOSIS — R5383 Other fatigue: Secondary | ICD-10-CM | POA: Insufficient documentation

## 2016-12-31 MED ORDER — VITAMIN D 1000 UNITS PO TABS: 1000.0000 [IU] | ORAL_TABLET | Freq: Every day | ORAL | 3 refills | Status: DC

## 2016-12-31 NOTE — Progress Notes (Signed)
Patient does not offer any problems today.  

## 2016-12-31 NOTE — Progress Notes (Signed)
[facility] Initial consultation  Patient Care Team: Lovett Sox as PCP - General (Physician Assistant)  CHIEF COMPLAINTS/PURPOSE OF CONSULTATION: I have iron deficiency  HISTORY OF PRESENTING ILLNESS: Lisa Walker 31 y.o. [place] female with past medical history listed as below is referred by primary care provider to me for evaluation and management of iron deficiency anemia. Patient speaks English fluently and she tells me she is able to understand me well. Her husband came to the room and joined our conversation later. Patient reports having history of heavy menstrual period. She also had 2 termination of pregnancy. She was found to have iron deficiency anemia and has been on iron supplementation orally for the past year. However her iron store does not improve too much. Patient is still feels very fatigue and weakness. 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 She had the surgery for resection of the cyst and currently her neurologic symptoms are better. She is married and has 2 kids, youngest kid is 1 years old. She is not breast feeding. She is not a vegetarian and she consume red meat on  regular basis. Denies any family history of cancer.  Denies any stomach discomfort, chest pain, shortness of breath, abdominal pain, black stool, or blood in the stool. Denies any weight loss, fever or chills,. Reports fatigue, finger bone pain.  INTERVAL HISTORY Patient presents for evaluation after IV iron infusion. Patient has called and reports that she has lower extremity swelling for a few days and now has resolved.  She has felt much better with energy level. SOB and fatigue have both resolved.    Review of Systems  Constitutional: Negative.   HENT:  Negative.   Eyes: Negative.   Respiratory: Negative.   Cardiovascular: Negative.   Gastrointestinal: Negative.   Endocrine:  Negative.   Genitourinary: Negative.    Musculoskeletal: Negative.   Skin: Negative.   Neurological: Negative.   Hematological: Negative.   Psychiatric/Behavioral: Negative.     MEDICAL HISTORY: Past Medical History:  Diagnosis Date  . Anemia   . Medical history non-contributory   . PONV (postoperative nausea and vomiting)     SURGICAL HISTORY: Past Surgical History:  Procedure Laterality Date  . CESAREAN SECTION N/A 2011   performed in [place]  . CESAREAN SECTION N/A 09/03/2014   Procedure: repeat CESAREAN SECTION;  Surgeon: Aletha Halim, MD;  Location: [facility] ORS;  Service: Obstetrics;  Laterality: N/A;    SOCIAL HISTORY: Social History   Social History  . Marital status: Married    Spouse name: N/A  . Number of children: N/A  . Years of education: N/A   Occupational History  . Not on file.   Social History Main Topics  . Smoking status: Never Smoker  . Smokeless tobacco: Never Used  . Alcohol use No  . Drug use: No  . Sexual activity: Yes    Birth control/ protection: None   Other Topics Concern  . Not on file   Social History Narrative  . No narrative on file    FAMILY HISTORY Family History  Problem Relation Age of Onset  . Diabetes Father     ALLERGIES:  is allergic to contrast media [iodinated diagnostic agents]; multihance [gadobenate]; and pineapple.  MEDICATIONS:  Current Outpatient Prescriptions  Medication Sig Dispense Refill  . Cyanocobalamin (RA VITAMIN B-12 TR) 1000 MCG TBCR Take by mouth.    Marland Kitchen  ferrous sulfate 325 (65 FE) MG EC tablet Take 325 mg by mouth daily with breakfast.     No current facility-administered medications for this visit.     PHYSICAL EXAMINATION:  ECOG PERFORMANCE STATUS: 0 - Asymptomatic   There were no vitals filed for this visit.  There were no vitals filed for this visit.   Physical Exam GENERAL: No distress, well nourished.  SKIN:  No rashes or significant lesions  HEAD: Normocephalic, No masses,  lesions, tenderness or abnormalities  EYES: Conjunctiva are pale, non icteric ENT: External ears normal ,lips , buccal mucosa, and tongue normal and mucous membranes are moist  LYMPH: No palpable cervical and axillary lymphadenopathy  LUNGS: Clear to auscultation, no crackles or wheezes HEART: Regular rate & rhythm, no murmurs, no gallops, S1 normal and S2 normal  ABDOMEN: Abdomen soft, non-tender, normal bowel sounds, I did not appreciate any  masses or organomegaly  MUSCULOSKELETAL: No CVA tenderness and no tenderness on percussion of the back or rib cage.  EXTREMITIES: trace  edema, no skin discoloration or tenderness NEURO: Alert & oriented, no focal motor/sensory deficits.    LABORATORY DATA: I have personally reviewed the data as listed: Labs from [place] showed 11/13/2016 ferritin 8 iron 39 TIBC 384 transferrin saturation 10. CBC hemoglobin 11.2 WBC 5.6 hematocrit 34 platelets 254 MCV 81.  Appointment on 12/25/2016  Component Date Value Ref Range Status  . Iron 12/25/2016 145  28 - 170 ug/dL Final  . TIBC 12/25/2016 310  250 - 450 ug/dL Final  . Saturation Ratios 12/25/2016 47* 10.4 - 31.8 % Final  . UIBC 12/25/2016 165  ug/dL Final  . Ferritin 12/25/2016 406* 11 - 307 ng/mL Final  . WBC 12/25/2016 6.5  3.6 - 11.0 K/uL Final  . RBC 12/25/2016 4.41  3.80 - 5.20 MIL/uL Final  . Hemoglobin 12/25/2016 12.1  12.0 - 16.0 g/dL Final  . HCT 12/25/2016 36.5  35.0 - 47.0 % Final  . MCV 12/25/2016 82.7  80.0 - 100.0 fL Final  . MCH 12/25/2016 27.5  26.0 - 34.0 pg Final  . MCHC 12/25/2016 33.2  32.0 - 36.0 g/dL Final  . RDW 12/25/2016 14.7* 11.5 - 14.5 % Final  . Platelets 12/25/2016 204  150 - 440 K/uL Final  . Neutrophils Relative % 12/25/2016 54  % Final  . Neutro Abs 12/25/2016 3.5  1.4 - 6.5 K/uL Final  . Lymphocytes Relative 12/25/2016 30  % Final  . Lymphs Abs 12/25/2016 1.9  1.0 - 3.6 K/uL Final  . Monocytes Relative 12/25/2016 7  % Final  . Monocytes Absolute  12/25/2016 0.5  0.2 - 0.9 K/uL Final  . Eosinophils Relative 12/25/2016 8  % Final  . Eosinophils Absolute 12/25/2016 0.5  0 - 0.7 K/uL Final  . Basophils Relative 12/25/2016 1  % Final  . Basophils Absolute 12/25/2016 0.0  0 - 0.1 K/uL Final  Appointment on 12/10/2016  Component Date Value Ref Range Status  . Antigliadin Abs, IgA 12/10/2016 5  0 - 19 units Final   Comment: (NOTE)                   Negative                   0 - 19                   Weak Positive             20 -  30                   Moderate to Strong Positive   >30   . Gliadin IgG 12/10/2016 2  0 - 19 units Final   Comment: (NOTE)                   Negative                   0 - 19                   Weak Positive             20 - 30                   Moderate to Strong Positive   >30   . Tissue Transglutaminase Ab, IgA 12/10/2016 <2  0 - 3 U/mL Final   Comment: (NOTE)                              Negative        0 -  3                              Weak Positive   4 - 10                              Positive           >10 Tissue Transglutaminase (tTG) has been identified as the endomysial antigen.  Studies have demonstr- ated that endomysial IgA antibodies have over 99% specificity for gluten sensitive enteropathy.   . Tissue Transglut Ab 12/10/2016 <2  0 - 5 U/mL Final   Comment: (NOTE)                              Negative        0 - 5                              Weak Positive   6 - 9                              Positive           >9   . Endomysial Ab, IgA 12/10/2016 Negative  Negative Final  . IgA 12/10/2016 332  87 - 352 mg/dL Final   Comment: (NOTE) Performed At: [facility] [place], [place] [id number] Lindon Romp MD [id number]   . Vitamin B-12 12/10/2016 699  180 - 914 pg/mL Final   Comment: (NOTE) This assay is not validated for testing neonatal or myeloproliferative syndrome specimens for Vitamin B12 levels. Performed at [facility], [place] [address],  [place], [place] [address]   . Vit D, 25-Hydroxy 12/10/2016 27.7* 30.0 - 100.0 ng/mL Final   Comment: (NOTE) Vitamin D deficiency has been defined by the [place] practice guideline as a level of serum 25-OH vitamin D less than 20 ng/mL (1,2). The Endocrine Society went on to further define vitamin D insufficiency as a level between 21 and 29 ng/mL (2). 1. IOM (  Institute of Medicine). 2010. Dietary reference   intakes for calcium and D. [place]: The   [employer]. 2. Holick MF, Binkley [place], Bischoff-Ferrari HA, et al.   Evaluation, treatment, and prevention of vitamin D   deficiency: an Endocrine Society clinical practice   guideline. JCEM. 2011 Jul; 96(7):1911-30. Performed At: [facility] [place], [place] [id number] Lindon Romp MD [id number]   . Fecal Occult Bld 12/11/2016 NEGATIVE  NEGATIVE Final  . Fecal Occult Bld 12/12/2016 NEGATIVE  NEGATIVE Final  . Fecal Occult Bld 12/13/2016 NEGATIVE  NEGATIVE Final   Iron/TIBC/Ferritin/ %Sat    Component Value Date/Time   IRON 145 12/25/2016 0825   TIBC 310 12/25/2016 0825   FERRITIN 406 (H) 12/25/2016 0825   IRONPCTSAT 47 (H) 12/25/2016 0825    RADIOGRAPHIC STUDIES: I have personally reviewed the radiological images as listed and agree with the findings in the report No recent images  ASSESSMENT/PLAN 1. Iron deficiency anemia due to chronic blood loss   2. Vitamin D deficiency   3. Bilateral leg edema   4. Fatigue associated with anemia   # Iron deficiency Anemia: s/p 1 dose of Feraheme. Patient has lower extremity edema. Repeat iron panel and cbc revealed good response.  No occult blood in stool, celiac panel is negative. Fatigue has now resolved, likely anemia associated.    # Explained to patient that leg edema likely is side effect from IV iron.  # Currently all resolved. Will hold additional IV iron at this time. If needed in the future,  will give additional doses.  # Suggest vitamin D supplements 1000 unit daily.   3 months with CBC, iron TIBC, ferritin.vitamin D level and see me in a few days.   All questions were answered. The patient knows to call the clinic with any problems, questions or concerns. Thank you for this kind referral and the opportunity to participate in the care of this patient. A copy of today's note is routed to referring provider Colbert Ewing.  Earlie Server, MD  12/31/2016 8:32 AM

## 2017-01-11 ENCOUNTER — Encounter: Payer: Self-pay | Admitting: Medical Oncology

## 2017-02-10 ENCOUNTER — Other Ambulatory Visit: Payer: Self-pay | Admitting: Neurological Surgery

## 2017-02-10 DIAGNOSIS — D334 Benign neoplasm of spinal cord: Secondary | ICD-10-CM

## 2017-02-11 ENCOUNTER — Ambulatory Visit: Payer: BLUE CROSS/BLUE SHIELD | Admitting: Oncology

## 2017-02-11 ENCOUNTER — Other Ambulatory Visit: Payer: BLUE CROSS/BLUE SHIELD

## 2017-03-29 ENCOUNTER — Inpatient Hospital Stay: Payer: BLUE CROSS/BLUE SHIELD | Attending: Oncology

## 2017-03-29 DIAGNOSIS — E559 Vitamin D deficiency, unspecified: Secondary | ICD-10-CM | POA: Diagnosis not present

## 2017-03-29 DIAGNOSIS — Z79899 Other long term (current) drug therapy: Secondary | ICD-10-CM | POA: Diagnosis not present

## 2017-03-29 DIAGNOSIS — N920 Excessive and frequent menstruation with regular cycle: Secondary | ICD-10-CM | POA: Diagnosis not present

## 2017-03-29 DIAGNOSIS — D500 Iron deficiency anemia secondary to blood loss (chronic): Secondary | ICD-10-CM | POA: Insufficient documentation

## 2017-03-29 LAB — CBC WITH DIFFERENTIAL/PLATELET
Basophils Absolute: 0.1 10*3/uL (ref 0–0.1)
Basophils Relative: 1 %
Eosinophils Absolute: 0.3 10*3/uL (ref 0–0.7)
Eosinophils Relative: 5 %
HEMATOCRIT: 34.2 % — AB (ref 35.0–47.0)
HEMOGLOBIN: 11.8 g/dL — AB (ref 12.0–16.0)
LYMPHS PCT: 32 %
Lymphs Abs: 1.9 10*3/uL (ref 1.0–3.6)
MCH: 28.8 pg (ref 26.0–34.0)
MCHC: 34.5 g/dL (ref 32.0–36.0)
MCV: 83.5 fL (ref 80.0–100.0)
MONOS PCT: 7 %
Monocytes Absolute: 0.4 10*3/uL (ref 0.2–0.9)
NEUTROS ABS: 3.3 10*3/uL (ref 1.4–6.5)
NEUTROS PCT: 55 %
Platelets: 238 10*3/uL (ref 150–440)
RBC: 4.1 MIL/uL (ref 3.80–5.20)
RDW: 13.1 % (ref 11.5–14.5)
WBC: 5.9 10*3/uL (ref 3.6–11.0)

## 2017-03-29 LAB — IRON AND TIBC
Iron: 51 ug/dL (ref 28–170)
SATURATION RATIOS: 18 % (ref 10.4–31.8)
TIBC: 286 ug/dL (ref 250–450)
UIBC: 235 ug/dL

## 2017-03-30 LAB — VITAMIN D 25 HYDROXY (VIT D DEFICIENCY, FRACTURES): Vit D, 25-Hydroxy: 27.4 ng/mL — ABNORMAL LOW (ref 30.0–100.0)

## 2017-04-01 ENCOUNTER — Other Ambulatory Visit: Payer: Self-pay

## 2017-04-01 ENCOUNTER — Encounter: Payer: Self-pay | Admitting: Oncology

## 2017-04-01 ENCOUNTER — Inpatient Hospital Stay (HOSPITAL_BASED_OUTPATIENT_CLINIC_OR_DEPARTMENT_OTHER): Payer: BLUE CROSS/BLUE SHIELD | Admitting: Oncology

## 2017-04-01 ENCOUNTER — Inpatient Hospital Stay: Payer: BLUE CROSS/BLUE SHIELD

## 2017-04-01 VITALS — BP 101/68 | HR 83 | Temp 97.5°F | Wt 146.2 lb

## 2017-04-01 DIAGNOSIS — N920 Excessive and frequent menstruation with regular cycle: Secondary | ICD-10-CM

## 2017-04-01 DIAGNOSIS — E559 Vitamin D deficiency, unspecified: Secondary | ICD-10-CM

## 2017-04-01 DIAGNOSIS — D500 Iron deficiency anemia secondary to blood loss (chronic): Secondary | ICD-10-CM

## 2017-04-01 DIAGNOSIS — Z79899 Other long term (current) drug therapy: Secondary | ICD-10-CM

## 2017-04-01 MED ORDER — VITAMIN D 1000 UNITS PO TABS: 1000.0000 [IU] | ORAL_TABLET | Freq: Every day | ORAL | 2 refills | Status: DC

## 2017-04-01 MED ORDER — FERROUS SULFATE 325 (65 FE) MG PO TBEC: 325.0000 mg | DELAYED_RELEASE_TABLET | Freq: Every day | ORAL | 2 refills | Status: DC

## 2017-04-01 MED ORDER — FERROUS SULFATE 325 (65 FE) MG PO TBEC: 325.0000 mg | DELAYED_RELEASE_TABLET | Freq: Two times a day (BID) | ORAL | 2 refills | Status: DC

## 2017-04-01 NOTE — Progress Notes (Signed)
[facility] Initial consultation  Patient Care Team: Lovett Sox as PCP - General (Physician Assistant) REASON FOR VISIT Follow up for treatment of iron deficiency anemia   HISTORY OF PRESENTING ILLNESS: Lisa Walker 31 y.o. [place] female with past medical history listed as below is referred by primary care provider to me for evaluation and management of iron deficiency anemia. Patient speaks English fluently and she tells me she is able to understand me well. Her husband came to the room and joined our conversation later. Patient reports having history of heavy menstrual period. She also had 2 termination of pregnancy. She was found to have iron deficiency anemia and has been on iron supplementation orally for the past year. However her iron store does not improve too much. Patient is still feels very fatigue and weakness. 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 She had the surgery for resection of the cyst and currently her neurologic symptoms are better. She is married and has 2 kids, youngest kid is 38 years old. She is not breast feeding. She is not a vegetarian and she consume red meat on  regular basis. Denies any family history of cancer.  Denies any stomach discomfort, chest pain, shortness of breath, abdominal pain, black stool, or blood in the stool. Denies any weight loss, fever or chills,. Reports fatigue, finger bone pain.  INTERVAL HISTORY Patient presents for management of iron deficiency anemia. She felt energy improved after iron infusion. 2 weeks ago she had a trip to [place] and since then she started to feel more weak. Her menstrual period was normal except the most recent one was heavy.    Review of Systems  Constitutional: Positive for fatigue. Negative for appetite change.  HENT:   Negative for hearing loss.   Eyes: Negative for eye problems.   Respiratory: Negative for chest tightness.        Cold symptoms  Gastrointestinal: Negative for abdominal distention.  Endocrine: Negative for hot flashes.  Genitourinary: Negative for difficulty urinating.   Musculoskeletal: Negative for arthralgias.  Skin: Negative for itching.  Neurological: Negative for dizziness.  Hematological: Negative for adenopathy.  Psychiatric/Behavioral: The patient is not nervous/anxious.     MEDICAL HISTORY: Past Medical History:  Diagnosis Date  . Anemia   . Medical history non-contributory   . PONV (postoperative nausea and vomiting)     SURGICAL HISTORY: Past Surgical History:  Procedure Laterality Date  . CESAREAN SECTION N/A 2011   performed in [place]  . CESAREAN SECTION N/A 09/03/2014   Procedure: repeat CESAREAN SECTION;  Surgeon: Aletha Halim, MD;  Location: [facility] ORS;  Service: Obstetrics;  Laterality: N/A;    SOCIAL HISTORY: Social History   Socioeconomic History  . Marital status: Married    Spouse name: Not on file  . Number of children: Not on file  . Years of education: Not on file  . Highest education level: Not on file  Social Needs  . Financial resource strain: Not on file  . Food insecurity - worry: Not on file  . Food insecurity - inability: Not on file  . Transportation needs - medical: Not on file  . Transportation needs - non-medical: Not on file  Occupational History  . Not on file  Tobacco Use  . Smoking status: Never Smoker  . Smokeless tobacco: Never Used  Substance and Sexual Activity  . Alcohol use: No  Alcohol/week: 0.0 oz  . Drug use: No  . Sexual activity: Yes    Birth control/protection: None  Other Topics Concern  . Not on file  Social History Narrative  . Not on file    FAMILY HISTORY Family History  Problem Relation Age of Onset  . Diabetes Father     ALLERGIES:  is allergic to contrast media [iodinated diagnostic agents]; multihance [gadobenate]; and pineapple.  MEDICATIONS:   Current Outpatient Medications  Medication Sig Dispense Refill  . cholecalciferol (VITAMIN D) 1000 units tablet Take 1 tablet (1,000 Units total) by mouth daily. 30 tablet 3  . Cyanocobalamin (RA VITAMIN B-12 TR) 1000 MCG TBCR Take by mouth.    . ferrous sulfate 325 (65 FE) MG EC tablet Take 325 mg by mouth daily with breakfast.     No current facility-administered medications for this visit.     PHYSICAL EXAMINATION:  ECOG PERFORMANCE STATUS: 0 - Asymptomatic   Vitals:   04/01/17 0842  BP: 101/68  Pulse: 83  Temp: (!) 97.5 F (36.4 C)    Filed Weights   04/01/17 0842  Weight: 146 lb 2.6 oz (66.3 kg)     Physical Exam  Constitutional: She is oriented to person, place, and time. No distress.  HENT:  Head: Normocephalic and atraumatic.  Eyes: Conjunctivae and EOM are normal. Pupils are equal, round, and reactive to light. No scleral icterus.  Neck: Normal range of motion. Neck supple. No JVD present.  Cardiovascular: Normal rate and regular rhythm.  No murmur heard. Pulmonary/Chest: Effort normal and breath sounds normal. She has no wheezes.  Abdominal: Soft. Bowel sounds are normal.  Musculoskeletal: Normal range of motion.  Lymphadenopathy:    She has no cervical adenopathy.  Neurological: She is alert and oriented to person, place, and time.  Skin: Skin is dry.  Psychiatric: Affect normal.      LABORATORY DATA: I have personally reviewed the data as listed: Labs from [place] showed 11/13/2016 ferritin 8 iron 39 TIBC 384 transferrin saturation 10. CBC hemoglobin 11.2 WBC 5.6 hematocrit 34 platelets 254 MCV 81.  Appointment on 03/29/2017  Component Date Value Ref Range Status  . Iron 03/29/2017 51  28 - 170 ug/dL Final  . TIBC 03/29/2017 286  250 - 450 ug/dL Final  . Saturation Ratios 03/29/2017 18  10.4 - 31.8 % Final  . UIBC 03/29/2017 235  ug/dL Final  . Vit D, 25-Hydroxy 03/29/2017 27.4* 30.0 - 100.0 ng/mL Final   Comment: (NOTE) Vitamin D  deficiency has been defined by the [place] practice guideline as a level of serum 25-OH vitamin D less than 20 ng/mL (1,2). The Endocrine Society went on to further define vitamin D insufficiency as a level between 21 and 29 ng/mL (2). 1. IOM (Institute of Medicine). 2010. Dietary reference   intakes for calcium and D. [place]: The   [employer]. 2. Holick MF, Binkley , Bischoff-Ferrari HA, et al.   Evaluation, treatment, and prevention of vitamin D   deficiency: an Endocrine Society clinical practice   guideline. JCEM. 2011 Jul; 96(7):1911-30. Performed At: [facility] [place], [place] [id number] Rush Farmer MD [id number]   . WBC 03/29/2017 5.9  3.6 - 11.0 K/uL Final  . RBC 03/29/2017 4.10  3.80 - 5.20 MIL/uL Final  . Hemoglobin 03/29/2017 11.8* 12.0 - 16.0 g/dL Final  . HCT 03/29/2017 34.2* 35.0 - 47.0 % Final  . MCV 03/29/2017 83.5  80.0 - 100.0 fL Final  . MCH 03/29/2017 28.8  26.0 - 34.0 pg Final  . MCHC 03/29/2017 34.5  32.0 - 36.0 g/dL Final  . RDW 03/29/2017 13.1  11.5 - 14.5 % Final  . Platelets 03/29/2017 238  150 - 440 K/uL Final  . Neutrophils Relative % 03/29/2017 55  % Final  . Neutro Abs 03/29/2017 3.3  1.4 - 6.5 K/uL Final  . Lymphocytes Relative 03/29/2017 32  % Final  . Lymphs Abs 03/29/2017 1.9  1.0 - 3.6 K/uL Final  . Monocytes Relative 03/29/2017 7  % Final  . Monocytes Absolute 03/29/2017 0.4  0.2 - 0.9 K/uL Final  . Eosinophils Relative 03/29/2017 5  % Final  . Eosinophils Absolute 03/29/2017 0.3  0 - 0.7 K/uL Final  . Basophils Relative 03/29/2017 1  % Final  . Basophils Absolute 03/29/2017 0.1  0 - 0.1 K/uL Final   Iron/TIBC/Ferritin/ %Sat    Component Value Date/Time   IRON 51 03/29/2017 0829   TIBC 286 03/29/2017 0829   FERRITIN 406 (H) 12/25/2016 0825   IRONPCTSAT 18 03/29/2017 0829   ASSESSMENT/PLAN 1. Iron deficiency anemia due to chronic blood loss    2. Vitamin D deficiency    # Iron deficiency Anemia: s/p 1 dose of Feraheme.  Transferrin Saturation decreased, but still normal. Normal TIBC. Hold additional feraheme.  Given that she has intermittent heavy menstrual period, advise patient to take oral iron supplements Ferrous Sulfate 325mg  BID.  I advise her to follow up with GYN for management of heavy menstrual period. # Mild Vitamin D deficiency,  Continue vitamin D supplements 1000 unit daily.   Follow Up visit: 3 months with CBC, iron TIBC, ferritin. and see me in a few days.   All questions were answered. The patient knows to call the clinic with any problems, questions or concerns.  Earlie Server, MD, PhD Hematology Oncology [facility] Pager- [phone] 04/01/2017

## 2017-04-01 NOTE — Progress Notes (Signed)
Patient here today for follow up.  Patient states no new concerns today  

## 2017-04-01 NOTE — Addendum Note (Signed)
Addended by: Earlie Server on: 04/01/2017 09:00 AM   Modules accepted: Orders

## 2017-04-22 ENCOUNTER — Other Ambulatory Visit: Payer: Self-pay | Admitting: Student

## 2017-04-22 ENCOUNTER — Other Ambulatory Visit (HOSPITAL_COMMUNITY): Payer: Self-pay | Admitting: Student

## 2017-04-22 DIAGNOSIS — R1084 Generalized abdominal pain: Secondary | ICD-10-CM

## 2017-04-26 ENCOUNTER — Ambulatory Visit (HOSPITAL_COMMUNITY): Admission: RE | Admit: 2017-04-26 | Payer: BLUE CROSS/BLUE SHIELD | Source: Ambulatory Visit

## 2017-06-25 ENCOUNTER — Inpatient Hospital Stay: Payer: BLUE CROSS/BLUE SHIELD | Attending: Oncology

## 2017-06-25 DIAGNOSIS — N920 Excessive and frequent menstruation with regular cycle: Secondary | ICD-10-CM | POA: Diagnosis present

## 2017-06-25 DIAGNOSIS — D500 Iron deficiency anemia secondary to blood loss (chronic): Secondary | ICD-10-CM

## 2017-06-25 DIAGNOSIS — E559 Vitamin D deficiency, unspecified: Secondary | ICD-10-CM | POA: Diagnosis not present

## 2017-06-25 DIAGNOSIS — R531 Weakness: Secondary | ICD-10-CM | POA: Insufficient documentation

## 2017-06-25 DIAGNOSIS — R5383 Other fatigue: Secondary | ICD-10-CM | POA: Insufficient documentation

## 2017-06-25 LAB — CBC
HCT: 35.3 % (ref 35.0–47.0)
Hemoglobin: 12.1 g/dL (ref 12.0–16.0)
MCH: 28.6 pg (ref 26.0–34.0)
MCHC: 34.2 g/dL (ref 32.0–36.0)
MCV: 83.6 fL (ref 80.0–100.0)
PLATELETS: 239 10*3/uL (ref 150–440)
RBC: 4.23 MIL/uL (ref 3.80–5.20)
RDW: 12.6 % (ref 11.5–14.5)
WBC: 7.9 10*3/uL (ref 3.6–11.0)

## 2017-06-25 LAB — IRON AND TIBC
Iron: 67 ug/dL (ref 28–170)
Saturation Ratios: 21 % (ref 10.4–31.8)
TIBC: 322 ug/dL (ref 250–450)
UIBC: 255 ug/dL

## 2017-06-25 LAB — FERRITIN: FERRITIN: 24 ng/mL (ref 11–307)

## 2017-07-01 ENCOUNTER — Inpatient Hospital Stay (HOSPITAL_BASED_OUTPATIENT_CLINIC_OR_DEPARTMENT_OTHER): Payer: BLUE CROSS/BLUE SHIELD | Admitting: Oncology

## 2017-07-01 ENCOUNTER — Other Ambulatory Visit: Payer: Self-pay

## 2017-07-01 ENCOUNTER — Inpatient Hospital Stay: Payer: BLUE CROSS/BLUE SHIELD

## 2017-07-01 ENCOUNTER — Encounter: Payer: Self-pay | Admitting: Oncology

## 2017-07-01 VITALS — BP 95/59 | HR 70 | Temp 97.5°F | Wt 145.1 lb

## 2017-07-01 DIAGNOSIS — D500 Iron deficiency anemia secondary to blood loss (chronic): Secondary | ICD-10-CM

## 2017-07-01 DIAGNOSIS — N920 Excessive and frequent menstruation with regular cycle: Secondary | ICD-10-CM | POA: Diagnosis not present

## 2017-07-01 DIAGNOSIS — R5383 Other fatigue: Secondary | ICD-10-CM

## 2017-07-01 DIAGNOSIS — E559 Vitamin D deficiency, unspecified: Secondary | ICD-10-CM | POA: Diagnosis not present

## 2017-07-01 DIAGNOSIS — D649 Anemia, unspecified: Secondary | ICD-10-CM

## 2017-07-01 DIAGNOSIS — R531 Weakness: Secondary | ICD-10-CM

## 2017-07-01 NOTE — Progress Notes (Signed)
[facility] Initial consultation  Patient Care Team: Lovett Sox as PCP - General (Physician Assistant) REASON FOR VISIT Follow up for treatment of iron deficiency anemia  HISTORY OF PRESENTING ILLNESS: Lisa Walker 32 y.o. [place] female with past medical history listed as below is referred by primary care provider to me for evaluation and management of iron deficiency anemia. Patient speaks English fluently and she tells me she is able to understand me well. Her husband came to the room and joined our conversation later. Patient reports having history of heavy menstrual period. She also had 2 termination of pregnancy. She was found to have iron deficiency anemia and has been on iron supplementation orally for the past year. However her iron store does not improve too much. Patient is still feels very fatigue and weakness. 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 She had the surgery for resection of the cyst and currently her neurologic symptoms are better. She is married and has 2 kids, youngest kid is 54 years old. She is not breast feeding. She is not a vegetarian and she consume red meat on  regular basis. Denies any family history of cancer.  Denies any stomach discomfort, chest pain, shortness of breath, abdominal pain, black stool, or blood in the stool. Denies any weight loss, fever or chills,. Reports fatigue, finger bone pain.  INTERVAL HISTORY Patient presents for management of iron deficiency anemia. She reports good energy level. Appetite is good. Denies blood in stool, black stool or change of bowel habit.   Review of Systems  Constitutional: Negative for appetite change, chills, fatigue and unexpected weight change.  HENT:   Negative for hearing loss, nosebleeds and sore throat.   Eyes: Negative for eye problems.  Respiratory: Negative for chest tightness, cough  and hemoptysis.   Cardiovascular: Negative for chest pain.  Gastrointestinal: Negative for abdominal distention, blood in stool and constipation.  Endocrine: Negative for hot flashes.  Genitourinary: Negative for difficulty urinating, dysuria, frequency and hematuria.   Musculoskeletal: Negative for arthralgias, flank pain and myalgias.  Skin: Negative for itching.  Neurological: Negative for dizziness, headaches, light-headedness and numbness.  Hematological: Negative for adenopathy.  Psychiatric/Behavioral: Negative for confusion and decreased concentration. The patient is not nervous/anxious.     MEDICAL HISTORY: Past Medical History:  Diagnosis Date  . Anemia   . Medical history non-contributory   . PONV (postoperative nausea and vomiting)     SURGICAL HISTORY: Past Surgical History:  Procedure Laterality Date  . CESAREAN SECTION N/A 2011   performed in [place]  . CESAREAN SECTION N/A 09/03/2014   Procedure: repeat CESAREAN SECTION;  Surgeon: Aletha Halim, MD;  Location: [facility] ORS;  Service: Obstetrics;  Laterality: N/A;    SOCIAL HISTORY: Social History   Socioeconomic History  . Marital status: Married    Spouse name: Not on file  . Number of children: Not on file  . Years of education: Not on file  . Highest education level: Not on file  Social Needs  . Financial resource strain: Not on file  . Food insecurity - worry: Not on file  . Food insecurity - inability: Not on file  . Transportation needs - medical: Not on file  . Transportation needs - non-medical: Not on file  Occupational History  . Not on file  Tobacco Use  . Smoking status: Never Smoker  . Smokeless tobacco: Never Used  Substance and Sexual Activity  . Alcohol use: No    Alcohol/week: 0.0 oz  . Drug use: No  . Sexual activity: Yes    Birth control/protection: None  Other Topics Concern  . Not on file  Social History Narrative  . Not on file    FAMILY HISTORY Family History  Problem  Relation Age of Onset  . Diabetes Father     ALLERGIES:  is allergic to contrast media [iodinated diagnostic agents]; multihance [gadobenate]; and pineapple.  MEDICATIONS:  Current Outpatient Medications  Medication Sig Dispense Refill  . cholecalciferol (VITAMIN D) 1000 units tablet Take 1 tablet (1,000 Units total) by mouth daily. 30 tablet 2  . ferrous sulfate 325 (65 FE) MG EC tablet Take 1 tablet (325 mg total) by mouth 2 (two) times daily with a meal. 60 tablet 2   No current facility-administered medications for this visit.     PHYSICAL EXAMINATION:  ECOG PERFORMANCE STATUS: 0 - Asymptomatic   Vitals:   07/01/17 0832  BP: (!) 95/59  Pulse: 70  Temp: (!) 97.5 F (36.4 C)    Filed Weights   07/01/17 0832  Weight: 145 lb 1 oz (65.8 kg)     Physical Exam  Constitutional: She is oriented to person, place, and time and well-developed, well-nourished, and in no distress. No distress.  HENT:  Head: Normocephalic and atraumatic.  Mouth/Throat: Oropharynx is clear and moist. No oropharyngeal exudate.  Eyes: Conjunctivae and EOM are normal. Pupils are equal, round, and reactive to light. No scleral icterus.  Neck: Normal range of motion. Neck supple. No JVD present.  Cardiovascular: Normal rate and regular rhythm.  No murmur heard. Pulmonary/Chest: Effort normal and breath sounds normal. She has no wheezes. She has no rales. She exhibits no tenderness.  Abdominal: Soft. Bowel sounds are normal. There is no tenderness.  Musculoskeletal: Normal range of motion.  Lymphadenopathy:    She has no cervical adenopathy.  Neurological: She is alert and oriented to person, place, and time. No cranial nerve deficit.  Skin: Skin is dry. She is not diaphoretic. No erythema.  Psychiatric: Memory, affect and judgment normal.      LABORATORY DATA: I have personally reviewed the data as listed: Labs from [facility] showed 11/13/2016 ferritin 8 iron 39 TIBC 384 transferrin  saturation 10. CBC hemoglobin 11.2 WBC 5.6 hematocrit 34 platelets 254 MCV 81.  Appointment on 06/25/2017  Component Date Value Ref Range Status  . WBC 06/25/2017 7.9  3.6 - 11.0 K/uL Final  . RBC 06/25/2017 4.23  3.80 - 5.20 MIL/uL Final  . Hemoglobin 06/25/2017 12.1  12.0 - 16.0 g/dL Final  . HCT 06/25/2017 35.3  35.0 - 47.0 % Final  . MCV 06/25/2017 83.6  80.0 - 100.0 fL Final  . MCH 06/25/2017 28.6  26.0 - 34.0 pg Final  . MCHC 06/25/2017 34.2  32.0 - 36.0 g/dL Final  . RDW 06/25/2017 12.6  11.5 - 14.5 % Final  . Platelets 06/25/2017 239  150 - 440 K/uL Final   Performed at [facility], [address], [place], [place] [address]  . Ferritin 06/25/2017 24  11 - 307 ng/mL Final   Performed at [facility], [address] [place], [place] [address]  . Iron 06/25/2017 67  28 - 170 ug/dL Final  . TIBC 06/25/2017 322  250 - 450 ug/dL Final  . Saturation Ratios 06/25/2017 21  10.4 - 31.8 % Final  . UIBC 06/25/2017 255  ug/dL  Final   Performed at [facility], [place],  [address]   Iron/TIBC/Ferritin/ %Sat    Component Value Date/Time   IRON 67 06/25/2017 1536   TIBC 322 06/25/2017 1536   FERRITIN 24 06/25/2017 1536   IRONPCTSAT 21 06/25/2017 1536   ASSESSMENT/PLAN 1. Iron deficiency anemia due to chronic blood loss   2. Vitamin D deficiency   3. Fatigue associated with anemia    # Iron deficiency Anemia: s/p 2 dose of Feraheme  Ferritin is acceptable, but has decreased from previus.  Hold additional feraheme.  Advise patient to take Ferrous Sulfate 325mg  TID.  I advise her to follow up with GYN for management of heavy menstrual period. # History Mild Vitamin D deficiency,  Continue vitamin D supplements 1000 unit daily. Advise patient to follow with primary care physician to recheck levels.   Follow Up visit: 3 months with CBC, iron TIBC, ferritin. and see me in a few days.   All questions were answered. The patient  knows to call the clinic with any problems, questions or concerns.  Earlie Server, MD, PhD Hematology Oncology [facility] at [facility] Pager- [phone] 07/01/2017

## 2017-07-01 NOTE — Progress Notes (Signed)
Patient here today for follow up.  Patient states that she has been feeling better.

## 2017-07-08 ENCOUNTER — Encounter: Payer: Self-pay | Admitting: Oncology

## 2017-07-11 ENCOUNTER — Other Ambulatory Visit: Payer: Self-pay | Admitting: Oncology

## 2017-07-11 MED ORDER — FERROUS SULFATE 325 (65 FE) MG PO TBEC: 325.0000 mg | DELAYED_RELEASE_TABLET | Freq: Two times a day (BID) | ORAL | 3 refills | Status: DC

## 2017-07-20 ENCOUNTER — Other Ambulatory Visit: Payer: Self-pay | Admitting: *Deleted

## 2017-07-20 DIAGNOSIS — D500 Iron deficiency anemia secondary to blood loss (chronic): Secondary | ICD-10-CM

## 2017-07-23 ENCOUNTER — Inpatient Hospital Stay: Payer: BLUE CROSS/BLUE SHIELD

## 2017-07-23 VITALS — BP 100/70 | HR 68 | Temp 96.1°F | Resp 18

## 2017-07-23 DIAGNOSIS — N920 Excessive and frequent menstruation with regular cycle: Secondary | ICD-10-CM | POA: Diagnosis not present

## 2017-07-23 DIAGNOSIS — D500 Iron deficiency anemia secondary to blood loss (chronic): Secondary | ICD-10-CM

## 2017-07-23 LAB — CBC WITH DIFFERENTIAL/PLATELET
Basophils Absolute: 0.1 10*3/uL (ref 0–0.1)
Basophils Relative: 1 %
EOS PCT: 2 %
Eosinophils Absolute: 0.2 10*3/uL (ref 0–0.7)
HEMATOCRIT: 34 % — AB (ref 35.0–47.0)
Hemoglobin: 11.8 g/dL — ABNORMAL LOW (ref 12.0–16.0)
LYMPHS ABS: 2.1 10*3/uL (ref 1.0–3.6)
LYMPHS PCT: 29 %
MCH: 28.6 pg (ref 26.0–34.0)
MCHC: 34.6 g/dL (ref 32.0–36.0)
MCV: 82.7 fL (ref 80.0–100.0)
MONO ABS: 0.3 10*3/uL (ref 0.2–0.9)
MONOS PCT: 4 %
NEUTROS ABS: 4.7 10*3/uL (ref 1.4–6.5)
Neutrophils Relative %: 64 %
PLATELETS: 233 10*3/uL (ref 150–440)
RBC: 4.12 MIL/uL (ref 3.80–5.20)
RDW: 12.8 % (ref 11.5–14.5)
WBC: 7.4 10*3/uL (ref 3.6–11.0)

## 2017-07-23 LAB — FERRITIN: FERRITIN: 30 ng/mL (ref 11–307)

## 2017-07-23 LAB — IRON AND TIBC
Iron: 61 ug/dL (ref 28–170)
Saturation Ratios: 19 % (ref 10.4–31.8)
TIBC: 316 ug/dL (ref 250–450)
UIBC: 255 ug/dL

## 2017-07-23 MED ORDER — SODIUM CHLORIDE 0.9 % IV SOLN
Freq: Once | INTRAVENOUS | Status: AC
  Administered 2017-07-23: 14:00:00 via INTRAVENOUS
  Filled 2017-07-23: qty 1000

## 2017-07-23 MED ORDER — SODIUM CHLORIDE 0.9 % IV SOLN
510.0000 mg | Freq: Once | INTRAVENOUS | Status: AC
  Administered 2017-07-23: 510 mg via INTRAVENOUS
  Filled 2017-07-23: qty 17

## 2017-07-23 NOTE — Patient Instructions (Signed)

## 2017-07-27 ENCOUNTER — Encounter: Payer: Self-pay | Admitting: Emergency Medicine

## 2017-07-27 ENCOUNTER — Other Ambulatory Visit: Payer: Self-pay

## 2017-07-27 ENCOUNTER — Ambulatory Visit
Admission: EM | Admit: 2017-07-27 | Discharge: 2017-07-27 | Disposition: A | Discharge status: Home / Self Care | Payer: BLUE CROSS/BLUE SHIELD | Attending: Family Medicine | Admitting: Family Medicine

## 2017-07-27 DIAGNOSIS — H9202 Otalgia, left ear: Secondary | ICD-10-CM

## 2017-07-27 DIAGNOSIS — J029 Acute pharyngitis, unspecified: Secondary | ICD-10-CM

## 2017-07-27 LAB — RAPID STREP SCREEN (MED CTR MEBANE ONLY): STREPTOCOCCUS, GROUP A SCREEN (DIRECT): NEGATIVE

## 2017-07-27 MED ORDER — DEXAMETHASONE 2 MG PO TABS: 2.0000 mg | ORAL_TABLET | Freq: Two times a day (BID) | ORAL | 0 refills | Status: DC

## 2017-07-27 NOTE — Discharge Instructions (Signed)
Use Flonase daily for the next 3-4 weeks.  Follow-up with your primary care physician if you are not improving

## 2017-07-27 NOTE — ED Triage Notes (Addendum)
Patient in today c/o sore throat and ear pain L>R x 4 days. Cough started this morning. Patient denies fever, but hasn't checked. Patient had this ~ 1 month ago and was given an antibiotic.

## 2017-07-27 NOTE — ED Provider Notes (Addendum)
[facility]    CSN: [id number] Arrival date & time: 07/27/17  1112     History   Chief Complaint Chief Complaint  Patient presents with  . Sore Throat    APPT  . Otalgia    L>R    HPI Lisa Walker is a 32 y.o. female.   HPI  32 year old [place] female presents with a sore throat and ear pain left greater than right that she has had for 4 days.  Prescribed antibody medication by a tele-med physician approximately 2 weeks ago.  However 6 days after completing the therapy she noticed the onset again of a sore throat.  Now she has throat pain hurts to swallow her ear pain is along her jawline.  No fever.  The cough that started this morning.. Husband has a similar symptoms think they probably infected from their child.         Past Medical History:  Diagnosis Date  . Anemia   . Medical history non-contributory   . PONV (postoperative nausea and vomiting)     Patient Active Problem List   Diagnosis Date Noted  . Vitamin D deficiency 12/16/2015  . Difficulty walking 12/12/2015  . Weakness of lower extremity 12/12/2015  . Iron deficiency anemia due to chronic blood loss 12/04/2015  . Vitamin B 12 deficiency 12/04/2015  . Vitamin D insufficiency 12/04/2015  . Indication for care in labor or delivery 09/03/2014  . S/P cesarean section 09/03/2014  . Supervision of normal pregnancy 09/02/2014  . H/O cesarean section complicating pregnancy 27/06/5007    Past Surgical History:  Procedure Laterality Date  . CESAREAN SECTION N/A 2011   performed in [place]  . CESAREAN SECTION N/A 09/03/2014   Procedure: repeat CESAREAN SECTION;  Surgeon: Aletha Halim, MD;  Location: [facility] ORS;  Service: Obstetrics;  Laterality: N/A;    OB History    Gravida  3   Para  2   Term  1   Preterm      AB  1   Living  2     SAB  1   TAB      Ectopic      Multiple  0   Live Births  2            Home Medications    Prior to Admission medications   Medication  Sig Start Date End Date Taking? Authorizing Provider  dexamethasone (DECADRON) 2 MG tablet Take 1 tablet (2 mg total) by mouth 2 (two) times daily with a meal. 07/27/17   Lorin Picket, PA-C    Family History Family History  Problem Relation Age of Onset  . Diabetes Father   . Diabetes Mother     Social History Social History   Tobacco Use  . Smoking status: Never Smoker  . Smokeless tobacco: Never Used  Substance Use Topics  . Alcohol use: No    Alcohol/week: 0.0 oz  . Drug use: No     Allergies   Contrast media [iodinated diagnostic agents]; Multihance [gadobenate]; and Pineapple   Review of Systems Review of Systems  Constitutional: Positive for activity change and appetite change. Negative for chills, fatigue and fever.  HENT: Positive for congestion, ear pain and sore throat.   Respiratory: Positive for cough.   All other systems reviewed and are negative.    Physical Exam Triage Vital Signs ED Triage Vitals [07/27/17 1133]  Enc Vitals Group     BP 103/65     Pulse Rate  82     Resp 16     Temp 98.6 F (37 C)     Temp Source Oral     SpO2 100 %     Weight 145 lb (65.8 kg)     Height 5\' 4"  (1.626 m)     Head Circumference      Peak Flow      Pain Score 8     Pain Loc      Pain Edu?      Excl. in [place]?    No data found.  Updated Vital Signs BP 103/65 (BP Location: Left Arm)   Pulse 82   Temp 98.6 F (37 C) (Oral)   Resp 16   Ht 5\' 4"  (1.626 m)   Wt 145 lb (65.8 kg)   LMP 07/12/2017   SpO2 100%   BMI 24.89 kg/m   Visual Acuity Right Eye Distance:   Left Eye Distance:   Bilateral Distance:    Right Eye Near:   Left Eye Near:    Bilateral Near:     Physical Exam  Constitutional: She is oriented to person, place, and time. She appears well-developed and well-nourished.  Non-toxic appearance. She does not appear ill. No distress.  HENT:  Head: Normocephalic.  Right Ear: Hearing and ear canal normal.  Left Ear: Hearing and ear canal  normal.  Mouth/Throat: Mucous membranes are normal. No oral lesions. No uvula swelling. Posterior oropharyngeal erythema present. No oropharyngeal exudate or tonsillar abscesses. Tonsils are 0 on the right. Tonsils are 0 on the left. No tonsillar exudate.  Both TMs are dull.  Eyes: Pupils are equal, round, and reactive to light.  Neck: Normal range of motion.  Pulmonary/Chest: Effort normal and breath sounds normal.  Abdominal: Soft. Bowel sounds are normal.  Lymphadenopathy:    She has no cervical adenopathy.  Neurological: She is alert and oriented to person, place, and time.  Skin: Skin is warm and dry.  Psychiatric: She has a normal mood and affect. Her behavior is normal.  Nursing note and vitals reviewed.    UC Treatments / Results  Labs (all labs ordered are listed, but only abnormal results are displayed) Labs Reviewed  RAPID STREP SCREEN (NOT AT [facility])  CULTURE, GROUP A STREP [facility])    EKG None Radiology No results found.  Procedures Procedures (including critical care time)  Medications Ordered in UC Medications - No data to display   Initial Impression / Assessment and Plan / UC Course  I have reviewed the triage vital signs and the nursing notes.  Pertinent labs & imaging results that were available during my care of the patient were reviewed by me and considered in my medical decision making (see chart for details).     Plan: 1. Test/x-ray results and diagnosis reviewed with patient 2. rx as per orders; risks, benefits, potential side effects reviewed with patient 3. Recommend supportive treatment with Flonase and nasal spray daily for the next 3-4 weeks.  Patient is just been prescribed antibiotics which showed did not help.  Prescribed through the tele-med physician.  Her throat swab today was negative.  At this point we will not prescribe antibiotics awaiting final culture sensitivities in 48 hours.  Because of her throat pain I will place her on Decadron  2 mg twice daily for 3 days.  If the cultures returned positive strep then she will be given another round of antibiotics.  Interestingly today her husband tested positive for strep.  He was  placed on antibiotic therapy. 4. F/u prn if symptoms worsen or don't improve   Final Clinical Impressions(s) / UC Diagnoses   Final diagnoses:  Sore throat    ED Discharge Orders        Ordered    dexamethasone (DECADRON) 2 MG tablet  2 times daily with meals     07/27/17 1202       Controlled Substance Prescriptions  Controlled Substance Registry consulted? Not Applicable   Lorin Picket, PA-C 07/27/17 1218    Lorin Picket, PA-C 07/27/17 1220

## 2017-07-29 ENCOUNTER — Telehealth (HOSPITAL_COMMUNITY): Payer: Self-pay

## 2017-07-29 ENCOUNTER — Telehealth: Payer: Self-pay | Admitting: Family Medicine

## 2017-07-29 ENCOUNTER — Telehealth: Payer: Self-pay

## 2017-07-29 LAB — CULTURE, GROUP A STREP (THRC)

## 2017-07-29 MED ORDER — AMOXICILLIN 500 MG PO TABS: 500.0000 mg | ORAL_TABLET | Freq: Two times a day (BID) | ORAL | 0 refills | Status: DC

## 2017-07-29 NOTE — Telephone Encounter (Signed)
Pt contacted regarding results from recent visit. Not available at this time.

## 2017-07-29 NOTE — Telephone Encounter (Signed)
+   strep.  Starting on Amoxicillin.  [facility]

## 2017-07-29 NOTE — Telephone Encounter (Signed)
Spoke with pt. Husband. Advised of results and verbalized understanding. Patient advised that medication is at the pharmacy to start.

## 2017-07-30 ENCOUNTER — Ambulatory Visit
Admission: RE | Admit: 2017-07-30 | Discharge: 2017-07-30 | Disposition: A | Discharge status: Home / Self Care | Payer: BLUE CROSS/BLUE SHIELD | Source: Ambulatory Visit | Attending: Neurological Surgery | Admitting: Neurological Surgery

## 2017-07-30 DIAGNOSIS — R9389 Abnormal findings on diagnostic imaging of other specified body structures: Secondary | ICD-10-CM | POA: Insufficient documentation

## 2017-07-30 DIAGNOSIS — Z981 Arthrodesis status: Secondary | ICD-10-CM | POA: Insufficient documentation

## 2017-07-30 DIAGNOSIS — D334 Benign neoplasm of spinal cord: Secondary | ICD-10-CM | POA: Diagnosis present

## 2017-07-30 DIAGNOSIS — M4322 Fusion of spine, cervical region: Secondary | ICD-10-CM | POA: Diagnosis present

## 2017-08-06 ENCOUNTER — Encounter: Payer: Self-pay | Admitting: Oncology

## 2017-08-25 ENCOUNTER — Ambulatory Visit
Admission: EM | Admit: 2017-08-25 | Discharge: 2017-08-25 | Disposition: A | Discharge status: Home / Self Care | Payer: BLUE CROSS/BLUE SHIELD | Attending: Family Medicine | Admitting: Family Medicine

## 2017-08-25 DIAGNOSIS — S46811A Strain of other muscles, fascia and tendons at shoulder and upper arm level, right arm, initial encounter: Secondary | ICD-10-CM

## 2017-08-25 DIAGNOSIS — X500XXA Overexertion from strenuous movement or load, initial encounter: Secondary | ICD-10-CM

## 2017-08-25 NOTE — ED Provider Notes (Signed)
[facility]    CSN: [id number] Arrival date & time: 08/25/17  1509     History   Chief Complaint Chief Complaint  Patient presents with  . Arm Pain    HPI Lisa Walker is a 32 y.o. female.   32 yo female with a c/o right shoulder pain for the past 2-3 weeks, but worse over the past week. 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 States pain intermittently radiates down the upper arm. Denies any neck pain, fevers, chills, rash, chest pains or shortness of breath.   The history is provided by the patient.  Arm Pain     Past Medical History:  Diagnosis Date  . Anemia   . Medical history non-contributory   . PONV (postoperative nausea and vomiting)     Patient Active Problem List   Diagnosis Date Noted  . Vitamin D deficiency 12/16/2015  . Difficulty walking 12/12/2015  . Weakness of lower extremity 12/12/2015  . Iron deficiency anemia due to chronic blood loss 12/04/2015  . Vitamin B 12 deficiency 12/04/2015  . Vitamin D insufficiency 12/04/2015  . Indication for care in labor or delivery 09/03/2014  . S/P cesarean section 09/03/2014  . Supervision of normal pregnancy 09/02/2014  . H/O cesarean section complicating pregnancy 40/11/6759    Past Surgical History:  Procedure Laterality Date  . CESAREAN SECTION N/A 2011   performed in [place]  . CESAREAN SECTION N/A 09/03/2014   Procedure: repeat CESAREAN SECTION;  Surgeon: Aletha Halim, MD;  Location: [facility] ORS;  Service: Obstetrics;  Laterality: N/A;    OB History    Gravida  3   Para  2   Term  1   Preterm      AB  1   Living  2     SAB  1   TAB      Ectopic      Multiple  0   Live Births  2            Home Medications    Prior to Admission medications   Medication Sig Start Date End Date Taking? Authorizing Provider  amoxicillin (AMOXIL) 500 MG tablet Take 1 tablet (500 mg total) by mouth 2 (two) times daily. 07/29/17   Coral Spikes, DO    dexamethasone (DECADRON) 2 MG tablet Take 1 tablet (2 mg total) by mouth 2 (two) times daily with a meal. 07/27/17   Lorin Picket, PA-C    Family History Family History  Problem Relation Age of Onset  . Diabetes Father   . Diabetes Mother     Social History Social History   Tobacco Use  . Smoking status: Never Smoker  . Smokeless tobacco: Never Used  Substance Use Topics  . Alcohol use: No    Alcohol/week: 0.0 oz  . Drug use: No     Allergies   Contrast media [iodinated diagnostic agents]; Multihance [gadobenate]; and Pineapple   Review of Systems Review of Systems   Physical Exam Triage Vital Signs ED Triage Vitals  Enc Vitals Group     BP 08/25/17 1531 108/73     Pulse Rate 08/25/17 1531 77     Resp 08/25/17 1531 18     Temp 08/25/17 1531 98 F (36.7 C)     Temp Source 08/25/17 1531 Oral     SpO2 08/25/17 1531 100 %     Weight --      Height --  Head Circumference --      Peak Flow --      Pain Score 08/25/17 1533 10     Pain Loc --      Pain Edu? --      Excl. in [place]? --    No data found.  Updated Vital Signs BP 108/73 (BP Location: Left Arm)   Pulse 77   Temp 98 F (36.7 C) (Oral)   Resp 18   LMP 08/10/2017 (Exact Date)   SpO2 100%   Breastfeeding? No   Visual Acuity Right Eye Distance:   Left Eye Distance:   Bilateral Distance:    Right Eye Near:   Left Eye Near:    Bilateral Near:     Physical Exam  Constitutional: She appears well-developed and well-nourished. No distress.  Musculoskeletal:       Right shoulder: She exhibits tenderness (over the deltoid muscle; reproducible). She exhibits normal range of motion, no bony tenderness, no swelling, no effusion, no crepitus, no deformity, no laceration, no pain, no spasm, normal pulse and normal strength.  Skin: She is not diaphoretic.  Vitals reviewed.    UC Treatments / Results  Labs (all labs ordered are listed, but only abnormal results are displayed) Labs Reviewed - No  data to display  EKG None  Radiology No results found.  Procedures Procedures (including critical care time)  Medications Ordered in UC Medications - No data to display  Initial Impression / Assessment and Plan / UC Course  I have reviewed the triage vital signs and the nursing notes.  Pertinent labs & imaging results that were available during my care of the patient were reviewed by me and considered in my medical decision making (see chart for details).      Final Clinical Impressions(s) / UC Diagnoses   Final diagnoses:  Strain of right deltoid muscle, initial encounter    ED Prescriptions    None     1. diagnosis reviewed with patient 2. Recommend supportive treatment with otc analgesics, ice, gentle range of motion 3. Follow-up prn if symptoms worsen or don't improve    Controlled Substance Prescriptions [place] Controlled Substance Registry consulted? Not Applicable   Norval Gable, MD 08/25/17 [phone]

## 2017-08-25 NOTE — ED Triage Notes (Signed)
Pt complains of right arm pain for 3 weeks but states it has gotten worse within the last week. No injures reported, also said it was sensitive to the touch. Ibuprofen 200mg  taken last night without relief.

## 2017-09-22 ENCOUNTER — Encounter: Payer: Self-pay | Admitting: Physical Therapy

## 2017-09-22 ENCOUNTER — Ambulatory Visit: Payer: BLUE CROSS/BLUE SHIELD | Attending: Neurology | Admitting: Physical Therapy

## 2017-09-22 DIAGNOSIS — M25511 Pain in right shoulder: Secondary | ICD-10-CM | POA: Diagnosis present

## 2017-09-22 DIAGNOSIS — G8929 Other chronic pain: Secondary | ICD-10-CM | POA: Diagnosis present

## 2017-09-22 DIAGNOSIS — M542 Cervicalgia: Secondary | ICD-10-CM | POA: Diagnosis present

## 2017-09-22 DIAGNOSIS — M5481 Occipital neuralgia: Secondary | ICD-10-CM | POA: Insufficient documentation

## 2017-09-22 DIAGNOSIS — M6281 Muscle weakness (generalized): Secondary | ICD-10-CM

## 2017-09-23 ENCOUNTER — Inpatient Hospital Stay: Payer: BLUE CROSS/BLUE SHIELD

## 2017-09-24 NOTE — Therapy (Addendum)
[place] [facility] [address]. [place], [place], [address] Phone: [phone]   Fax:  [phone]  Physical Therapy Evaluation  Patient Details  Name: Lisa Walker MRN: [id number] Date of Birth: [birth year] Referring Provider: Dr. Jennings Books   Encounter Date: 09/22/2017    Treatment: 1 of 8.  Recert date: 5/64/33.   Past Medical History:  Diagnosis Date  . Anemia   . Medical history non-contributory   . PONV (postoperative nausea and vomiting)     Past Surgical History:  Procedure Laterality Date  . CESAREAN SECTION N/A 2011   performed in [place]  . CESAREAN SECTION N/A 09/03/2014   Procedure: repeat CESAREAN SECTION;  Surgeon: Aletha Halim, MD;  Location: [facility] ORS;  Service: Obstetrics;  Laterality: N/A;    There were no vitals filed for this visit.     Pt. reports increase R shoulder pain >2 months ago with no reported MOI. Pt. states muscle relaxers help but has persistent tenderness/ soreness with light palpation.     There.ex.:  Seated UT/ levator stretches/ chin tucks.    Scapular retraction and B ER.       Pt. is a pleasant 32 y/o female with >2 months of R shoulder pain/ tenderness. Pt. reports 4/10 R shoulder pain currently at rest and increase pain with palpation. No bruising or swelling noted in R deltoid/ UT region. (+) R deltoid tenderness/ (+) Hawkins and Neers impingment testing. Cervical AROM: flexion (65 deg.), extension (49 deg.), R lat. flex. (40 deg.), L lat. flex. (48 deg.), R rotn. (68 deg.), L rotn. (57 deg.). B UE muscle strength grossly 4/5 MMT (pain limited with R sh. flexion/ abd.). FOTO: baseline 53/ goal 71. PT discussed benefits of ice/heat and stretching. Pt. will benefit from skilled PT services to increase R sh. AROM/ strength to improve pain-free mobility/ overhead reaching.       PT Long Term Goals - 10/12/17 1351      PT LONG TERM GOAL #1   Title  Pt. independent with HEP to increase B UE  muscle strength 1/2 muscle grade to improve pain-free mobility/ overhead reaching.      Baseline  B UE muscle strength grossly 4/5 MMT (increase pain with R sh. MMT).      Time  4    Period  Weeks    Status  New    Target Date  10/20/17      PT LONG TERM GOAL #2   Title  Pt. will increase FOTO to 71 to improve pain-free mobility with R UE.      Baseline  FOTO baseline 53/ goal 71.      Time  4    Period  Weeks    Status  New    Target Date  10/20/17      PT LONG TERM GOAL #3   Title  Pt. will report no tenderness with R shoulder/ deltoid/ UT palpation to improve pain-free mobility.      Baseline  (+) R sh. tenderness.     Time  4    Period  Weeks    Status  New    Target Date  10/20/17          Patient will benefit from skilled therapeutic intervention in order to improve the following deficits and impairments:  Abnormal gait, Improper body mechanics, Pain, Postural dysfunction, Decreased mobility, Decreased coordination, Decreased activity tolerance, Decreased endurance, Decreased range of motion, Decreased strength, Difficulty walking, Decreased safety  awareness, Decreased balance  Visit Diagnosis: Muscle weakness (generalized)  Pain in neck  Chronic right shoulder pain     Problem List Patient Active Problem List   Diagnosis Date Noted  . Vitamin D deficiency 12/16/2015  . Difficulty walking 12/12/2015  . Weakness of lower extremity 12/12/2015  . Iron deficiency anemia due to chronic blood loss 12/04/2015  . Vitamin B 12 deficiency 12/04/2015  . Vitamin D insufficiency 12/04/2015  . Indication for care in labor or delivery 09/03/2014  . S/P cesarean section 09/03/2014  . Supervision of normal pregnancy 09/02/2014  . H/O cesarean section complicating pregnancy 00/45/9977   Pura Spice, PT, DPT # [phone] 10/12/2017, 1:55 PM  [place] [facility] [address] [place], [place], [address] Phone: [phone]   Fax:   [phone]  Name: Lisa Walker MRN: [id number] Date of Birth: [birth year]

## 2017-09-28 ENCOUNTER — Ambulatory Visit: Payer: BLUE CROSS/BLUE SHIELD | Admitting: Physical Therapy

## 2017-09-29 ENCOUNTER — Ambulatory Visit: Payer: BLUE CROSS/BLUE SHIELD | Attending: Neurology | Admitting: Physical Therapy

## 2017-09-29 DIAGNOSIS — M6281 Muscle weakness (generalized): Secondary | ICD-10-CM | POA: Diagnosis not present

## 2017-09-29 DIAGNOSIS — M542 Cervicalgia: Secondary | ICD-10-CM | POA: Diagnosis present

## 2017-09-29 DIAGNOSIS — G8929 Other chronic pain: Secondary | ICD-10-CM | POA: Diagnosis present

## 2017-09-29 DIAGNOSIS — M25511 Pain in right shoulder: Secondary | ICD-10-CM | POA: Diagnosis present

## 2017-09-30 ENCOUNTER — Encounter: Payer: Self-pay | Admitting: Oncology

## 2017-09-30 ENCOUNTER — Encounter: Payer: BLUE CROSS/BLUE SHIELD | Admitting: Physical Therapy

## 2017-09-30 ENCOUNTER — Inpatient Hospital Stay: Payer: BLUE CROSS/BLUE SHIELD

## 2017-09-30 ENCOUNTER — Other Ambulatory Visit: Payer: Self-pay

## 2017-09-30 ENCOUNTER — Inpatient Hospital Stay: Payer: BLUE CROSS/BLUE SHIELD | Attending: Oncology | Admitting: Oncology

## 2017-09-30 VITALS — BP 99/65 | HR 69 | Temp 97.1°F | Wt 145.7 lb

## 2017-09-30 DIAGNOSIS — D509 Iron deficiency anemia, unspecified: Secondary | ICD-10-CM | POA: Insufficient documentation

## 2017-09-30 DIAGNOSIS — R5383 Other fatigue: Secondary | ICD-10-CM | POA: Insufficient documentation

## 2017-09-30 DIAGNOSIS — E559 Vitamin D deficiency, unspecified: Secondary | ICD-10-CM

## 2017-09-30 DIAGNOSIS — D500 Iron deficiency anemia secondary to blood loss (chronic): Secondary | ICD-10-CM

## 2017-09-30 DIAGNOSIS — M7989 Other specified soft tissue disorders: Secondary | ICD-10-CM

## 2017-09-30 DIAGNOSIS — D649 Anemia, unspecified: Secondary | ICD-10-CM

## 2017-09-30 MED ORDER — FERROUS SULFATE 325 (65 FE) MG PO TBEC: 325.0000 mg | DELAYED_RELEASE_TABLET | Freq: Three times a day (TID) | ORAL | 3 refills | Status: DC

## 2017-09-30 NOTE — Progress Notes (Signed)
[facility] Initial consultation  Patient Care Team: Lovett Sox as PCP - General (Physician Assistant) REASON FOR VISIT Follow up for treatment of iron deficiency anemia  HISTORY OF PRESENTING ILLNESS: Lisa Walker 32 y.o. [place] female with past medical history listed as below is referred by primary care provider to me for evaluation and management of iron deficiency anemia. Patient speaks English fluently and she tells me she is able to understand me well. Her husband came to the room and joined our conversation later. Patient reports having history of heavy menstrual period. She also had 2 termination of pregnancy. She was found to have iron deficiency anemia and has been on iron supplementation orally for the past year. However her iron store does not improve too much. Patient is still feels very fatigue and weakness. 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 She had the surgery for resection of the cyst and currently her neurologic symptoms are better. She is married and has 2 kids, youngest kid is 19 years old. She is not breast feeding. She is not a vegetarian and she consume red meat on  regular basis. Denies any family history of cancer.  Denies any stomach discomfort, chest pain, shortness of breath, abdominal pain, black stool, or blood in the stool. Denies any weight loss, fever or chills,. Reports fatigue, finger bone pain.  INTERVAL HISTORY Patient presents for management of anemia.  Iron deficiency anemia: s/p IV feraheme in the past. Ferritin was checked at neurology office at [facility]. Ferritin was at 131. TIBC/TSAT not checked.  Fatigue: improved.  Reports new onset of right arm bump for 2 months, acute onset, no context of trauma however she reports getting physical therapy and massage.  Bump is tender to palpable, persistent pain. Aggravated by motion.  Nothing makes it better.   Review of Systems  Constitutional: Negative for appetite change, chills, fatigue and unexpected weight change.  HENT:   Negative for hearing loss, nosebleeds and sore throat.   Eyes: Negative for eye problems.  Respiratory: Negative for chest tightness, cough and hemoptysis.   Cardiovascular: Negative for chest pain.  Gastrointestinal: Negative for abdominal distention, blood in stool and constipation.  Endocrine: Negative for hot flashes.  Genitourinary: Negative for difficulty urinating, dysuria, frequency and hematuria.   Musculoskeletal: Negative for arthralgias, flank pain and myalgias.       Right arm pain.   Skin: Negative for itching.  Neurological: Negative for dizziness, headaches, light-headedness and numbness.  Hematological: Negative for adenopathy.  Psychiatric/Behavioral: Negative for confusion and decreased concentration. The patient is not nervous/anxious.     MEDICAL HISTORY: Past Medical History:  Diagnosis Date  . Anemia   . Medical history non-contributory   . PONV (postoperative nausea and vomiting)     SURGICAL HISTORY: Past Surgical History:  Procedure Laterality Date  . CESAREAN SECTION N/A 2011   performed in [place]  . CESAREAN SECTION N/A 09/03/2014   Procedure: repeat CESAREAN SECTION;  Surgeon: Aletha Halim, MD;  Location: [facility] ORS;  Service: Obstetrics;  Laterality: N/A;    SOCIAL HISTORY: Social History   Socioeconomic History  . Marital status: Married    Spouse name: Not on file  . Number of children: Not on file  . Years of education: Not on file  . Highest education level: Not on file  Occupational History  . Not on file  Social Needs  .  Financial resource strain: Not on file  . Food insecurity:    Worry: Not on file    Inability: Not on file  . Transportation needs:    Medical: Not on file    Non-medical: Not on file  Tobacco Use  . Smoking status: Never Smoker  . Smokeless tobacco: Never Used   Substance and Sexual Activity  . Alcohol use: No    Alcohol/week: 0.0 oz  . Drug use: No  . Sexual activity: Yes    Birth control/protection: None  Lifestyle  . Physical activity:    Days per week: Not on file    Minutes per session: Not on file  . Stress: Not on file  Relationships  . Social connections:    Talks on phone: Not on file    Gets together: Not on file    Attends religious service: Not on file    Active member of club or organization: Not on file    Attends meetings of clubs or organizations: Not on file    Relationship status: Not on file  . Intimate partner violence:    Fear of current or ex partner: Not on file    Emotionally abused: Not on file    Physically abused: Not on file    Forced sexual activity: Not on file  Other Topics Concern  . Not on file  Social History Narrative  . Not on file    FAMILY HISTORY Family History  Problem Relation Age of Onset  . Diabetes Father   . Diabetes Mother     ALLERGIES:  is allergic to contrast media [iodinated diagnostic agents]; multihance [gadobenate]; and pineapple.  MEDICATIONS:  Current Outpatient Medications  Medication Sig Dispense Refill  . amoxicillin (AMOXIL) 500 MG tablet Take 1 tablet (500 mg total) by mouth 2 (two) times daily. 20 tablet 0  . dexamethasone (DECADRON) 2 MG tablet Take 1 tablet (2 mg total) by mouth 2 (two) times daily with a meal. 6 tablet 0   No current facility-administered medications for this visit.     PHYSICAL EXAMINATION:  ECOG PERFORMANCE STATUS: 0 - Asymptomatic   Vitals:   09/30/17 0837  BP: 99/65  Pulse: 69  Temp: (!) 97.1 F (36.2 C)    Filed Weights   09/30/17 0837  Weight: 145 lb 11.6 oz (66.1 kg)     Physical Exam  Constitutional: She is oriented to person, place, and time and well-developed, well-nourished, and in no distress. No distress.  HENT:  Head: Normocephalic and atraumatic.  Nose: Nose normal.  Mouth/Throat: Oropharynx is clear and  moist. No oropharyngeal exudate.  Eyes: Pupils are equal, round, and reactive to light. Conjunctivae and EOM are normal. Left eye exhibits no discharge. No scleral icterus.  Neck: Normal range of motion. Neck supple. No JVD present.  Cardiovascular: Normal rate, regular rhythm and normal heart sounds.  No murmur heard. Pulmonary/Chest: Effort normal and breath sounds normal. No respiratory distress. She has no wheezes. She has no rales. She exhibits no tenderness.  Abdominal: Soft. Bowel sounds are normal. She exhibits no distension and no mass. There is no tenderness. There is no rebound.  Musculoskeletal: Normal range of motion. She exhibits tenderness.  Right arm fixed soft tissue mass, 5cm x5cm, tender to palpation. No bruising.   Lymphadenopathy:    She has no cervical adenopathy.  Neurological: She is alert and oriented to person, place, and time. No cranial nerve deficit. She exhibits normal muscle tone. Coordination normal.  Skin: Skin  is warm and dry. No rash noted. She is not diaphoretic. No erythema.  Psychiatric: Memory, affect and judgment normal.      LABORATORY DATA: I have personally reviewed the data as listed: Labs from [place] showed 11/13/2016 ferritin 8 iron 39 TIBC 384 transferrin saturation 10. CBC hemoglobin 11.2 WBC 5.6 hematocrit 34 platelets 254 MCV 81.  No visits with results within 1 Month(s) from this visit.  Latest known visit with results is:  Admission on 07/27/2017, Discharged on 07/27/2017  Component Date Value Ref Range Status  . Streptococcus, Group A Screen (Dir* 07/27/2017 NEGATIVE  NEGATIVE Final   Comment: (NOTE) A Rapid Antigen test may result negative if the antigen level in the sample is below the detection level of this test. The FDA has not cleared this test as a stand-alone test therefore the rapid antigen negative result has reflexed to a Group A Strep culture. Performed at [facility], [address] [place], [place] [address]   . Specimen Description 07/27/2017    Final                   Value:THROAT Performed at [facility], [address], [place], [place] [address]   . Special Requests 07/27/2017    Final                   Value:NONE Reflexed from [id number] Performed at [facility], [address], [place],  [address]   . Culture 07/27/2017 MODERATE GROUP A STREP (S.PYOGENES) ISOLATED   Final  . Report Status 07/27/2017 07/29/2017 FINAL   Final   Iron/TIBC/Ferritin/ %Sat    Component Value Date/Time   IRON 61 07/23/2017 1345   TIBC 316 07/23/2017 1345   FERRITIN 30 07/23/2017 1345   IRONPCTSAT 19 07/23/2017 1345   ASSESSMENT/PLAN 1. Iron deficiency anemia due to chronic blood loss   2. Vitamin D deficiency   3. Fatigue associated with anemia   4. Soft tissue mass    # Iron deficiency Anemia: s/p 2 dose of Feraheme Ferritin is acceptable.  Advise patient to continue take ferrous sulfate 325mg  BID. Rx sent to Pharmacy.  # Fatigue is better.  # Vitamin D deficiency,  Vitamin D level was checked by neurologist and reviewed by me. Vitamin D level at 32. Advise patient to continue OTC vitamin D 1000units. Follow up with PCP to recheck level in 6 months.  # Soft tissue mass, this is a new problem.  ? Hematoma, infection vs malignancy. She had an CXR done recently which was independently reviewed by me.  Showed  Plan US soft tissue right upper extremity.   Follow Up visit: 3 months with CBC, iron TIBC, ferritin. Check cbc, iron TIBC, ferritin 1-2 days prior to MD visit in 6 months for assessment.   [place] right upper extremity showed no discrete soft tissue mass or fluid collection  Continue monitor. If persistent swelling, can do MRI for further evaluation.  All questions were answered. The patient knows to call the clinic with any problems, questions or concerns.  Earlie Server, MD, PhD Hematology Oncology [facility] at [facility] Pager- [phone] 09/30/2017

## 2017-10-01 NOTE — Therapy (Addendum)
[place] [facility] [address] [place], [place], [address] Phone: [phone]   Fax:  [phone]  Physical Therapy Treatment  Patient Details  Name: Lisa Walker MRN: [id number] Date of Birth: 02/26/1986 Referring Provider: Dr. Jennings Books   Encounter Date: 09/29/2017  PT End of Session - 10/12/17 1532    Visit Number  2    Number of Visits  8    Date for PT Re-Evaluation  10/20/17    PT Start Time  1559    PT Stop Time  1651    PT Time Calculation (min)  52 min    Activity Tolerance  Patient tolerated treatment well;Patient limited by pain    Behavior During Therapy  [facility] for tasks assessed/performed       Past Medical History:  Diagnosis Date  . Anemia   . Medical history non-contributory   . PONV (postoperative nausea and vomiting)     Past Surgical History:  Procedure Laterality Date  . CESAREAN SECTION N/A 2011   performed in [place]  . CESAREAN SECTION N/A 09/03/2014   Procedure: repeat CESAREAN SECTION;  Surgeon: Aletha Halim, MD;  Location: [facility] ORS;  Service: Obstetrics;  Laterality: N/A;    There were no vitals filed for this visit.  Subjective Assessment - 10/12/17 1509    Subjective  Pt. reports no neck pain today.  Pt. states R shoulder is really tender/ sore.      Pertinent History  pt. known to PT clinic.  See past medical history.      Limitations  House hold activities;Lifting    Patient Stated Goals  Decrease neck/ shoulder pain.      Currently in Pain?  Yes    Pain Score  5     Pain Location  Shoulder    Pain Orientation  Right    Pain Descriptors / Indicators  Tender    Pain Type  Chronic pain          Treatment:  There.ex.:  Standing Nautilus: 30# lat. Pull downs/ 30# tricep ext./ 30# scap. Retraction 30x.   Standing B shoulder AROM all planes.  Full R sh. AROM with increase pain at end-range ER.    Manual tx.:  STM to R deltoid/ proximal biceps musculature 8 min.   R shoulder AP/PA/inferior  mobs. (grade III) 3x20 sec. (good mobility but pain with hand placement).    [place]: pulsed at 1 Mz to R shoulder for 10 min. (1.8 W/cm2).  Good tx. Tolerance.    Ice to R shoulder after tx. Session.      PT Long Term Goals - 10/12/17 1351      PT LONG TERM GOAL #1   Title  Pt. independent with HEP to increase B UE muscle strength 1/2 muscle grade to improve pain-free mobility/ overhead reaching.      Baseline  B UE muscle strength grossly 4/5 MMT (increase pain with R sh. MMT).      Time  4    Period  Weeks    Status  New    Target Date  10/20/17      PT LONG TERM GOAL #2   Title  Pt. will increase FOTO to 71 to improve pain-free mobility with R UE.      Baseline  FOTO baseline 53/ goal 71.      Time  4    Period  Weeks    Status  New    Target Date  10/20/17  PT LONG TERM GOAL #3   Title  Pt. will report no tenderness with R shoulder/ deltoid/ UT palpation to improve pain-free mobility.      Baseline  (+) R sh. tenderness.     Time  4    Period  Weeks    Status  New    Target Date  10/20/17         Plan - 10/12/17 1532    Clinical Impression Statement  Pt. remains very tender/ sensitive with light palpation to R deltoid musculature.  Good tolerance with use of [place] (pulsed) to R shoulder.   No increase pain reported during pressure of [place] head to R shoulder  PT encouraged pt. to continue with use of HEP/ ice to R sh.       Clinical Presentation  Stable    Clinical Decision Making  Low    Rehab Potential  Good    PT Frequency  2x / week    PT Duration  4 weeks    PT Treatment/Interventions  ADLs/Self Care Home Management;Gait training;Stair training;Functional mobility training;Therapeutic activities;Therapeutic exercise;Balance training;Patient/family education;Neuromuscular re-education;Manual techniques;Passive range of motion;Energy conservation    PT Next Visit Plan  Progress R shoulder pain-free mobility/ decrease inflammation    PT Home Exercise Plan  See HEP        Patient will benefit from skilled therapeutic intervention in order to improve the following deficits and impairments:  Abnormal gait, Improper body mechanics, Pain, Postural dysfunction, Decreased mobility, Decreased coordination, Decreased activity tolerance, Decreased endurance, Decreased range of motion, Decreased strength, Difficulty walking, Decreased safety awareness, Decreased balance  Visit Diagnosis: Muscle weakness (generalized)  Pain in neck  Chronic right shoulder pain     Problem List Patient Active Problem List   Diagnosis Date Noted  . Vitamin D deficiency 12/16/2015  . Difficulty walking 12/12/2015  . Weakness of lower extremity 12/12/2015  . Iron deficiency anemia due to chronic blood loss 12/04/2015  . Vitamin B 12 deficiency 12/04/2015  . Vitamin D insufficiency 12/04/2015  . Indication for care in labor or delivery 09/03/2014  . S/P cesarean section 09/03/2014  . Supervision of normal pregnancy 09/02/2014  . H/O cesarean section complicating pregnancy 19/37/9024   Pura Spice, PT, DPT # [id number] 10/12/2017, 3:36 PM  [place] [facility] [employer] [facility] [address] [place], [place], [address] Phone: [phone]   Fax:  [phone]  Name: Saraia Platner MRN: [id number] Date of Birth: 11-03-1985

## 2017-10-04 ENCOUNTER — Ambulatory Visit
Admission: RE | Admit: 2017-10-04 | Discharge: 2017-10-04 | Disposition: A | Discharge status: Home / Self Care | Payer: BLUE CROSS/BLUE SHIELD | Source: Ambulatory Visit | Attending: Oncology | Admitting: Oncology

## 2017-10-04 DIAGNOSIS — M799 Soft tissue disorder, unspecified: Secondary | ICD-10-CM | POA: Diagnosis not present

## 2017-10-04 DIAGNOSIS — D649 Anemia, unspecified: Secondary | ICD-10-CM | POA: Diagnosis not present

## 2017-10-04 DIAGNOSIS — M7989 Other specified soft tissue disorders: Secondary | ICD-10-CM

## 2017-10-05 ENCOUNTER — Encounter: Payer: Self-pay | Admitting: Physical Therapy

## 2017-10-05 ENCOUNTER — Ambulatory Visit: Payer: BLUE CROSS/BLUE SHIELD | Admitting: Physical Therapy

## 2017-10-05 DIAGNOSIS — M6281 Muscle weakness (generalized): Secondary | ICD-10-CM | POA: Diagnosis not present

## 2017-10-05 DIAGNOSIS — M25511 Pain in right shoulder: Secondary | ICD-10-CM

## 2017-10-05 DIAGNOSIS — M542 Cervicalgia: Secondary | ICD-10-CM

## 2017-10-05 DIAGNOSIS — G8929 Other chronic pain: Secondary | ICD-10-CM

## 2017-10-07 ENCOUNTER — Ambulatory Visit: Payer: BLUE CROSS/BLUE SHIELD | Admitting: Physical Therapy

## 2017-10-07 DIAGNOSIS — M542 Cervicalgia: Secondary | ICD-10-CM

## 2017-10-07 DIAGNOSIS — G8929 Other chronic pain: Secondary | ICD-10-CM

## 2017-10-07 DIAGNOSIS — M6281 Muscle weakness (generalized): Secondary | ICD-10-CM

## 2017-10-07 DIAGNOSIS — M25511 Pain in right shoulder: Secondary | ICD-10-CM

## 2017-10-08 NOTE — Therapy (Addendum)
[facility] [address] [place], [place], [address] Phone: [phone]   Fax:  [phone]  Physical Therapy Treatment  Patient Details  Name: Lisa Walker MRN: [id number] Date of Birth: 01/15/1986 Referring Provider: Dr. Jennings Books   Encounter Date: 10/05/2017  PT End of Session - 10/12/17 1601    Visit Number  3    Number of Visits  8    Date for PT Re-Evaluation  10/20/17    PT Start Time  1603    PT Stop Time  1700    PT Time Calculation (min)  57 min    Activity Tolerance  Patient tolerated treatment well;Patient limited by pain    Behavior During Therapy  [facility] for tasks assessed/performed       Past Medical History:  Diagnosis Date  . Anemia   . Medical history non-contributory   . PONV (postoperative nausea and vomiting)     Past Surgical History:  Procedure Laterality Date  . CESAREAN SECTION N/A 2011   performed in [place]  . CESAREAN SECTION N/A 09/03/2014   Procedure: repeat CESAREAN SECTION;  Surgeon: Aletha Halim, MD;  Location: [facility] ORS;  Service: Obstetrics;  Laterality: N/A;    There were no vitals filed for this visit.  Subjective Assessment - 10/12/17 1553    Subjective  Pt. states she is unable to lie on R shoulder and demonstrates full R shoulder AROM (all planes).      Pertinent History  pt. known to PT clinic.  See past medical history.      Limitations  House hold activities;Lifting    Patient Stated Goals  Decrease neck/ shoulder pain.      Currently in Pain?  Yes    Pain Score  3     Pain Location  Shoulder    Pain Orientation  Right    Pain Descriptors / Indicators  Sore;Tender    Pain Type  Chronic pain           Treatment:  There.ex.:  Standing Nautilus: 40# lat. Pull downs/ 30# tricep ext./ 40# scap. Retraction/ 30# chest press 20x.   Supine R shoulder rhythmic stabs 4x30 sec. (moderate resistance)- all planes.        Manual tx.:  STM to R deltoid/ proximal biceps musculature 11  min.   R shoulder AP/PA/inferior mobs. (grade III) 3x20 sec. (good mobility but pain with hand placement).   Supine R UT/levator manual stretches and trigger point release to R UT region.  Ice to R shoulder after tx. Session.        PT Long Term Goals - 10/12/17 1351      PT LONG TERM GOAL #1   Title  Pt. independent with HEP to increase B UE muscle strength 1/2 muscle grade to improve pain-free mobility/ overhead reaching.      Baseline  B UE muscle strength grossly 4/5 MMT (increase pain with R sh. MMT).      Time  4    Period  Weeks    Status  New    Target Date  10/20/17      PT LONG TERM GOAL #2   Title  Pt. will increase FOTO to 71 to improve pain-free mobility with R UE.      Baseline  FOTO baseline 53/ goal 71.      Time  4    Period  Weeks    Status  New    Target Date  10/20/17  PT LONG TERM GOAL #3   Title  Pt. will report no tenderness with R shoulder/ deltoid/ UT palpation to improve pain-free mobility.      Baseline  (+) R sh. tenderness.     Time  4    Period  Weeks    Status  New    Target Date  10/20/17         Plan - 10/12/17 1602    Clinical Impression Statement  Marked increase in resisted ex. program with no increase c/o R shoulder pain.  Tenderness/ muscle tightness remains in R deltoid region with palpation.  No increase pain with full R shoulder AROM all planes of movement.  No change to HEP at this time with further education on use of modalites/ stretching.      Clinical Presentation  Stable    Clinical Decision Making  Low    Rehab Potential  Good    PT Frequency  2x / week    PT Duration  4 weeks    PT Treatment/Interventions  ADLs/Self Care Home Management;Gait training;Stair training;Functional mobility training;Therapeutic activities;Therapeutic exercise;Balance training;Patient/family education;Neuromuscular re-education;Manual techniques;Passive range of motion;Energy conservation    PT Next Visit Plan  Progress R shoulder pain-free  mobility/ decrease inflammation    PT Home Exercise Plan  See HEP       Patient will benefit from skilled therapeutic intervention in order to improve the following deficits and impairments:  Abnormal gait, Improper body mechanics, Pain, Postural dysfunction, Decreased mobility, Decreased coordination, Decreased activity tolerance, Decreased endurance, Decreased range of motion, Decreased strength, Difficulty walking, Decreased safety awareness, Decreased balance  Visit Diagnosis: Muscle weakness (generalized)  Pain in neck  Chronic right shoulder pain     Problem List Patient Active Problem List   Diagnosis Date Noted  . Vitamin D deficiency 12/16/2015  . Difficulty walking 12/12/2015  . Weakness of lower extremity 12/12/2015  . Iron deficiency anemia due to chronic blood loss 12/04/2015  . Vitamin B 12 deficiency 12/04/2015  . Vitamin D insufficiency 12/04/2015  . Indication for care in labor or delivery 09/03/2014  . S/P cesarean section 09/03/2014  . Supervision of normal pregnancy 09/02/2014  . H/O cesarean section complicating pregnancy 54/62/7035   Pura Spice, PT, DPT # [id number] 10/12/2017, 4:04 PM  [place] [facility] [address] [place], [place], [address] Phone: [phone]   Fax:  [phone]  Name: Lisa Walker MRN: [id number] Date of Birth: 04-14-1986

## 2017-10-08 NOTE — Therapy (Addendum)
[facility]  [facility] [address] [place], [place], [address] Phone: [phone]   Fax:  [phone]  Physical Therapy Treatment  Patient Details  Name: Lisa Walker MRN: [id number] Date of Birth: [birth year] Referring Provider: Dr. Jennings Books   Encounter Date: 10/07/2017  PT End of Session - 10/12/17 1611    Visit Number  4    Number of Visits  8    Date for PT Re-Evaluation  10/20/17    PT Start Time  1606    PT Stop Time  1703    PT Time Calculation (min)  57 min    Activity Tolerance  Patient tolerated treatment well;Patient limited by pain    Behavior During Therapy  [facility] for tasks assessed/performed       Past Medical History:  Diagnosis Date  . Anemia   . Medical history non-contributory   . PONV (postoperative nausea and vomiting)     Past Surgical History:  Procedure Laterality Date  . CESAREAN SECTION N/A 2011   performed in [place]  . CESAREAN SECTION N/A 09/03/2014   Procedure: repeat CESAREAN SECTION;  Surgeon: Aletha Halim, MD;  Location: [facility] ORS;  Service: Obstetrics;  Laterality: N/A;    There were no vitals filed for this visit.  Subjective Assessment - 10/12/17 1610    Subjective  Pt. reports decrease in R shoulder pain at rest and with some daily tasks.  Pt. has been doing a lot cooking for recent parties.  Pt. reports 5/10 R sh. pain at this time, esp. with palpation.      Pertinent History  pt. known to PT clinic.  See past medical history.      Limitations  House hold activities;Lifting    Patient Stated Goals  Decrease neck/ shoulder pain.      Currently in Pain?  Yes    Pain Score  5     Pain Location  Shoulder    Pain Orientation  Right    Pain Descriptors / Indicators  Tender;Sore    Pain Type  Chronic pain    Pain Onset  More than a month ago    Pain Frequency  Constant          Treatment:  There.ex.:  Standing Nautilus: 50# lat. Pull downs/ 30# tricep ext./ 30# scap. Retraction/ 30# B sh.  Adduction with handles/ 30# chest press 20x.  Standing IR 20#/ ER 10# 10 each (fatigue/ pain in R shoulder).   Standing B shoulder AROM all planes.  Full R sh. AROM with increase pain at end-range ER.    Manual tx.:  STM to R deltoid/ proximal biceps musculature 8 min.   R shoulder A/PROM all planes with static holds.   AP/PA/inferior mobs. (grade III) 3x20 sec. (good mobility but pain with hand placement over R deltoid). Trigger point dry needling to R deltoid (2 needles)- 1 muscle fasciculation noted.  Slight bleeding noted but resolved quickly.   Issued Biofreeze for home use.       Ice to R shoulder after tx. Session.        PT Long Term Goals - 10/12/17 1351      PT LONG TERM GOAL #1   Title  Pt. independent with HEP to increase B UE muscle strength 1/2 muscle grade to improve pain-free mobility/ overhead reaching.      Baseline  B UE muscle strength grossly 4/5 MMT (increase pain with R sh. MMT).      Time  4    Period  Weeks    Status  New    Target Date  10/20/17      PT LONG TERM GOAL #2   Title  Pt. will increase FOTO to 71 to improve pain-free mobility with R UE.      Baseline  FOTO baseline 53/ goal 71.      Time  4    Period  Weeks    Status  New    Target Date  10/20/17      PT LONG TERM GOAL #3   Title  Pt. will report no tenderness with R shoulder/ deltoid/ UT palpation to improve pain-free mobility.      Baseline  (+) R sh. tenderness.     Time  4    Period  Weeks    Status  New    Target Date  10/20/17         Plan - 10/12/17 1611    Clinical Impression Statement  (+) tenderness/ muscle tightness remains in R deltoid musculature with palpation.  Minimal to no pain in R shoulder at resting position.  Pt. received trigger point dry needling today and was very fearful of pain but did well.  Ice provided at end of tx. to decrease/ prevent soft tissue inflammation.      Clinical Presentation  Stable    Clinical Decision Making  Low    Rehab Potential  Good     PT Frequency  2x / week    PT Duration  4 weeks    PT Treatment/Interventions  ADLs/Self Care Home Management;Gait training;Stair training;Functional mobility training;Therapeutic activities;Therapeutic exercise;Balance training;Patient/family education;Neuromuscular re-education;Manual techniques;Passive range of motion;Energy conservation    PT Next Visit Plan  Progress R shoulder pain-free mobility/ decrease inflammation.  DISCUSS how needling was.      PT Home Exercise Plan  See HEP       Patient will benefit from skilled therapeutic intervention in order to improve the following deficits and impairments:  Abnormal gait, Improper body mechanics, Pain, Postural dysfunction, Decreased mobility, Decreased coordination, Decreased activity tolerance, Decreased endurance, Decreased range of motion, Decreased strength, Difficulty walking, Decreased safety awareness, Decreased balance  Visit Diagnosis: Muscle weakness (generalized)  Pain in neck  Chronic right shoulder pain     Problem List Patient Active Problem List   Diagnosis Date Noted  . Vitamin D deficiency 12/16/2015  . Difficulty walking 12/12/2015  . Weakness of lower extremity 12/12/2015  . Iron deficiency anemia due to chronic blood loss 12/04/2015  . Vitamin B 12 deficiency 12/04/2015  . Vitamin D insufficiency 12/04/2015  . Indication for care in labor or delivery 09/03/2014  . S/P cesarean section 09/03/2014  . Supervision of normal pregnancy 09/02/2014  . H/O cesarean section complicating pregnancy 40/01/2724   Pura Spice, PT, DPT # [id number] 10/12/2017, 4:21 PM  [place] [facility] [address] [place], [place], [address] Phone: [phone]   Fax:  [phone]  Name: Lisa Walker MRN: [id number] Date of Birth: 1985/11/03

## 2017-10-12 ENCOUNTER — Ambulatory Visit: Payer: BLUE CROSS/BLUE SHIELD | Admitting: Physical Therapy

## 2017-10-12 ENCOUNTER — Encounter: Payer: Self-pay | Admitting: Physical Therapy

## 2017-10-12 DIAGNOSIS — M6281 Muscle weakness (generalized): Secondary | ICD-10-CM

## 2017-10-12 DIAGNOSIS — G8929 Other chronic pain: Secondary | ICD-10-CM

## 2017-10-12 DIAGNOSIS — M25511 Pain in right shoulder: Secondary | ICD-10-CM

## 2017-10-12 DIAGNOSIS — M542 Cervicalgia: Secondary | ICD-10-CM

## 2017-10-12 NOTE — Therapy (Signed)
[facility] [address] [place], [place], [address] Phone: [phone]   Fax:  [phone]  Physical Therapy Treatment  Patient Details  Name: Lisa Walker MRN: [id number] Date of Birth: April 10, 1986 Referring Provider: Dr. Jennings Books   Encounter Date: 10/12/2017  PT End of Session - 10/12/17 1709    Visit Number  5    Number of Visits  8    Date for PT Re-Evaluation  10/20/17    PT Start Time  1660    PT Stop Time  1643    PT Time Calculation (min)  47 min    Activity Tolerance  Patient tolerated treatment well;Patient limited by pain    Behavior During Therapy  [facility] for tasks assessed/performed       Past Medical History:  Diagnosis Date  . Anemia   . Medical history non-contributory   . PONV (postoperative nausea and vomiting)     Past Surgical History:  Procedure Laterality Date  . CESAREAN SECTION N/A 2011   performed in [place]  . CESAREAN SECTION N/A 09/03/2014   Procedure: repeat CESAREAN SECTION;  Surgeon: Aletha Halim, MD;  Location: [facility] ORS;  Service: Obstetrics;  Laterality: N/A;    There were no vitals filed for this visit.  Subjective Assessment - 10/12/17 1651    Subjective  Pt. reports that she is in no pain upon arrival to clinic today.  Pt. reports slight soreness the day following tx. but has since resolved.  Pt. seems to be excited about therapy today due to not being in pain.    Pertinent History  pt. known to PT clinic.  See past medical history.      Limitations  House hold activities;Lifting    Patient Stated Goals  Decrease neck/ shoulder pain.      Currently in Pain?  No/denies    Pain Score  0-No pain    Pain Onset  More than a month ago         Treatment:  There.ex.:    Standing Nautilus:  20# Standing ER 11 (fatigue/ pain in R shoulder) with handles  30# Standing IR with handles 30# tricep ext. With bar 30# scap. Retraction with bar 30# B sh. Adduction with handles 30# chest  press 20x. With bar 50# lat. Pull downs with bar  Standing B shoulder AROM all planes.      Manual tx.:    STM to R deltoid/ proximal biceps musculature 8 min.   AP/inferior mobs. (grade III) 3x20 sec. (good mobility and no pain). Trigger point dry needling to R deltoid (2 needles)- 1 muscle fasciculation noted.  Ice to R shoulder after tx. Session.         PT Long Term Goals - 10/12/17 1351      PT LONG TERM GOAL #1   Title  Pt. independent with HEP to increase B UE muscle strength 1/2 muscle grade to improve pain-free mobility/ overhead reaching.      Baseline  B UE muscle strength grossly 4/5 MMT (increase pain with R sh. MMT).      Time  4    Period  Weeks    Status  New    Target Date  10/20/17      PT LONG TERM GOAL #2   Title  Pt. will increase FOTO to 71 to improve pain-free mobility with R UE.      Baseline  FOTO baseline 53/ goal 71.  Time  4    Period  Weeks    Status  New    Target Date  10/20/17      PT LONG TERM GOAL #3   Title  Pt. will report no tenderness with R shoulder/ deltoid/ UT palpation to improve pain-free mobility.      Baseline  (+) R sh. tenderness.     Time  4    Period  Weeks    Status  New    Target Date  10/20/17            Plan - 10/12/17 1710    Clinical Impression Statement  Pt. able to tolerate palpation from PT with slight increase in discomfort.  Pt. reports that area in R deltoid is still tender to touch, but is able to tolerate more pressure than last week.  Pt. progressed in strengthening exercises and tolerated dry needling much better and was less fearful than prior tx. session.  Ice was provided at end of treatment to prevent inflammation of tissue.    Clinical Presentation  Stable    Clinical Decision Making  Low    Rehab Potential  Good    PT Frequency  2x / week    PT Duration  4 weeks    PT Treatment/Interventions  ADLs/Self Care Home Management;Gait training;Stair training;Functional mobility  training;Therapeutic activities;Therapeutic exercise;Balance training;Patient/family education;Neuromuscular re-education;Manual techniques;Passive range of motion;Energy conservation    PT Next Visit Plan  Progress R shoulder pain-free mobility/ decrease inflammation.  Check GOALS.      PT Home Exercise Plan  See HEP       Patient will benefit from skilled therapeutic intervention in order to improve the following deficits and impairments:  Abnormal gait, Improper body mechanics, Pain, Postural dysfunction, Decreased mobility, Decreased coordination, Decreased activity tolerance, Decreased endurance, Decreased range of motion, Decreased strength, Difficulty walking, Decreased safety awareness, Decreased balance  Visit Diagnosis: Muscle weakness (generalized)  Pain in neck  Chronic right shoulder pain     Problem List Patient Active Problem List   Diagnosis Date Noted  . Vitamin D deficiency 12/16/2015  . Difficulty walking 12/12/2015  . Weakness of lower extremity 12/12/2015  . Iron deficiency anemia due to chronic blood loss 12/04/2015  . Vitamin B 12 deficiency 12/04/2015  . Vitamin D insufficiency 12/04/2015  . Indication for care in labor or delivery 09/03/2014  . S/P cesarean section 09/03/2014  . Supervision of normal pregnancy 09/02/2014  . H/O cesarean section complicating pregnancy 97/35/3299   Pura Spice, PT, DPT # [phone] Nation, SPT 10/12/2017, 8:02 PM  [place] [facility] [address] [place], [place], [address] Phone: [phone]   Fax:  [phone]  Name: Ethelreda Sukhu MRN: [id number] Date of Birth: 1985-06-26

## 2017-10-12 NOTE — Addendum Note (Signed)
Addended by: Pura Spice on: 10/12/2017 01:59 PM   Modules accepted: Orders

## 2017-10-14 ENCOUNTER — Encounter: Payer: Self-pay | Admitting: Physical Therapy

## 2017-10-14 ENCOUNTER — Ambulatory Visit: Payer: BLUE CROSS/BLUE SHIELD

## 2017-10-14 DIAGNOSIS — M6281 Muscle weakness (generalized): Secondary | ICD-10-CM

## 2017-10-14 DIAGNOSIS — G8929 Other chronic pain: Secondary | ICD-10-CM

## 2017-10-14 DIAGNOSIS — M25511 Pain in right shoulder: Secondary | ICD-10-CM

## 2017-10-14 NOTE — Therapy (Signed)
[place] [facility] [address] [place], [place], [address] Phone: [phone]   Fax:  [phone]  Physical Therapy Treatment  Patient Details  Name: Lisa Walker MRN: [id number] Date of Birth: 1985/08/09 Referring Provider: Dr. Jennings Books   Encounter Date: 10/14/2017  PT End of Session - 10/14/17 1656    Visit Number  6    Number of Visits  8    Date for PT Re-Evaluation  10/20/17    PT Start Time  1600    PT Stop Time  1700    PT Time Calculation (min)  60 min    Activity Tolerance  Patient tolerated treatment well;Patient limited by pain    Behavior During Therapy  [facility] for tasks assessed/performed       Past Medical History:  Diagnosis Date  . Anemia   . Medical history non-contributory   . PONV (postoperative nausea and vomiting)     Past Surgical History:  Procedure Laterality Date  . CESAREAN SECTION N/A 2011   performed in [place]  . CESAREAN SECTION N/A 09/03/2014   Procedure: repeat CESAREAN SECTION;  Surgeon: Aletha Halim, MD;  Location: [facility] ORS;  Service: Obstetrics;  Laterality: N/A;    There were no vitals filed for this visit.  Subjective Assessment - 10/14/17 1601    Subjective  Pt. reports that she is in no pain upon arrival to clinic today. Pt. reports slight soreness the day following tx. No specific questions or concerns currently. She remains most limited in R shoulder internal rotation.    Pertinent History  pt. known to PT clinic.  See past medical history.      Limitations  House hold activities;Lifting    Patient Stated Goals  Decrease neck/ shoulder pain.      Currently in Pain?  No/denies    Pain Onset  --         Treatment:  Ther-ex    Standing Nautilus:  10# Standing ER 11 (fatigue/ pain in R shoulder) with handles x 20; 20# Standing IR with handles x 20; 30# tricep ext. With bar x 20; 40# scap. Retraction with bar x 20; 30# chest press 20x. with bar 50# lat. Pull downs with bar x  20;  Standing B shoulder AROM all planes.   Manual Therapy  R shoulder AP mobilizations at neutral, grade II-III, 30s/bout x 3 bouts; R shoulder interior mobilizations at 90 abduction, grade II-III, 30s/bout x 3 bouts; R shoulder gentle distraction and floppy fish mobilizaitonsl; R shoulder prone PA mobilizaitons grade II-III, 30s/bout x 3 bouts; Pt educated about the risks and benefits of dry needling including risk of pneumothorax over the lung fields. Pt advised to go to ER and notify them she has been dry needled over her lung fields if she experiences any dry cough, chest pain, SOB, DOE, or difficulty taking a deep inspiration. Pt provides verbal consent to treatment. Husband also notified of above risks.Trigger point dry needling performed by Roxana Hires PT, DPT [facility] to R lateral deltoid, two sticks with LTR during one, immediate reduction in tone and pain. TDN to R teres minor to improve R shoulder IR, two sticks with no LTR noted but reduction in pain and immediate gain in IR AROM from just above belt-line prior to bra line following (TDN unbilled);                        PT Education - 10/14/17 1656  Education provided  Yes    Education Details  exercise form/technique, HEP progression    Person(s) Educated  Patient    Methods  Explanation    Comprehension  Verbalized understanding          PT Long Term Goals - 10/12/17 1351      PT LONG TERM GOAL #1   Title  Pt. independent with HEP to increase B UE muscle strength 1/2 muscle grade to improve pain-free mobility/ overhead reaching.      Baseline  B UE muscle strength grossly 4/5 MMT (increase pain with R sh. MMT).      Time  4    Period  Weeks    Status  New    Target Date  10/20/17      PT LONG TERM GOAL #2   Title  Pt. will increase FOTO to 71 to improve pain-free mobility with R UE.      Baseline  FOTO baseline 53/ goal 71.      Time  4    Period  Weeks    Status  New    Target Date   10/20/17      PT LONG TERM GOAL #3   Title  Pt. will report no tenderness with R shoulder/ deltoid/ UT palpation to improve pain-free mobility.      Baseline  (+) R sh. tenderness.     Time  4    Period  Weeks    Status  New    Target Date  10/20/17            Plan - 10/15/17 2309    Clinical Impression Statement  Pt demonstrates significant improvement in R shoulder ER following dry needling. She is only able to get to her belt line prior to TDN but afterward reaches her bra line. She also reports significant reduction in pain with IR following TDN. She is able to participate in all exercises as instructed. Pt encouraged to add R shoulder IR twoel stretch to HEP and follow-up as scheduled.     Rehab Potential  Good    PT Frequency  2x / week    PT Duration  4 weeks    PT Treatment/Interventions  ADLs/Self Care Home Management;Gait training;Stair training;Functional mobility training;Therapeutic activities;Therapeutic exercise;Balance training;Patient/family education;Neuromuscular re-education;Manual techniques;Passive range of motion;Energy conservation    PT Next Visit Plan  Progress R shoulder pain-free mobility/ decrease inflammation.  Check GOALS.      PT Home Exercise Plan  See HEP       Patient will benefit from skilled therapeutic intervention in order to improve the following deficits and impairments:  Abnormal gait, Improper body mechanics, Pain, Postural dysfunction, Decreased mobility, Decreased coordination, Decreased activity tolerance, Decreased endurance, Decreased range of motion, Decreased strength, Difficulty walking, Decreased safety awareness, Decreased balance  Visit Diagnosis: Muscle weakness (generalized)  Chronic right shoulder pain     Problem List Patient Active Problem List   Diagnosis Date Noted  . Vitamin D deficiency 12/16/2015  . Difficulty walking 12/12/2015  . Weakness of lower extremity 12/12/2015  . Iron deficiency anemia due to chronic  blood loss 12/04/2015  . Vitamin B 12 deficiency 12/04/2015  . Vitamin D insufficiency 12/04/2015  . Indication for care in labor or delivery 09/03/2014  . S/P cesarean section 09/03/2014  . Supervision of normal pregnancy 09/02/2014  . H/O cesarean section complicating pregnancy 81/19/1478   Phillips Grout PT, DPT   Lakina Mcintire 10/15/2017, 11:18 PM  [place]  [facility] [address] [place], [place], [address] Phone: [phone]   Fax:  [phone]  Name: Lisa Walker MRN: [id number] Date of Birth: 22-Feb-1986

## 2017-10-19 ENCOUNTER — Ambulatory Visit: Payer: BLUE CROSS/BLUE SHIELD

## 2017-10-19 DIAGNOSIS — G8929 Other chronic pain: Secondary | ICD-10-CM

## 2017-10-19 DIAGNOSIS — M6281 Muscle weakness (generalized): Secondary | ICD-10-CM

## 2017-10-19 DIAGNOSIS — M25511 Pain in right shoulder: Secondary | ICD-10-CM

## 2017-10-19 NOTE — Therapy (Signed)
[facility] [address]. [place], [place], [address] Phone: [phone]   Fax:  [phone]  Physical Therapy Treatment  Patient Details  Name: Apryle Stowell MRN: [id number] Date of Birth: 1986-01-18 Referring Provider: Dr. Jennings Books   Encounter Date: 10/19/2017  PT End of Session - 10/20/17 1729    Visit Number  7    Number of Visits  8    Date for PT Re-Evaluation  10/20/17    PT Start Time  1600    PT Stop Time  1645    PT Time Calculation (min)  45 min    Activity Tolerance  Patient tolerated treatment well;Patient limited by pain    Behavior During Therapy  [facility] for tasks assessed/performed       Past Medical History:  Diagnosis Date  . Anemia   . Medical history non-contributory   . PONV (postoperative nausea and vomiting)     Past Surgical History:  Procedure Laterality Date  . CESAREAN SECTION N/A 2011   performed in [place]  . CESAREAN SECTION N/A 09/03/2014   Procedure: repeat CESAREAN SECTION;  Surgeon: Aletha Halim, MD;  Location: [facility] ORS;  Service: Obstetrics;  Laterality: N/A;    There were no vitals filed for this visit.  Subjective Assessment - 10/20/17 1728    Subjective  Pt states that she feels approximately 80% improved since starting therapy. No pain upon arrival today. Minimal soreness after last session. No specific questions or concerns at this time    Pertinent History  pt. known to PT clinic.  See past medical history.      Limitations  House hold activities;Lifting    Patient Stated Goals  Decrease neck/ shoulder pain.      Currently in Pain?  No/denies      Treatment:  Manual Therapy  STM to R deltoid with effleurage and gentle trigger point release. Gradual reduction in trigger points throughout duration of manual therapy Reviewed HEP stretch techniques and assessed ROM  Trigger Point Dry Needling (TDN) Education performed with patient regarding potential benefit of TDN. Reviewed  precautions and risks with patient. Reviewed special precautions/risks over lung fields which include pneumothorax. Reviewed signs and symptoms of pneumothorax and advised pt to go to ER immediately if these symptoms develop advise them of dry needling treatment. Extensive time spent with pt to ensure full understanding of TDN risks. Pt provided verbal consent to treatment. TDN performed to R subscapularis (prone approach) with 1, 0.30 x 60 single needle placement with local twitch response (LTR), R infraspinatus with 1 0.3x60 single needle with LTR, R teres major with 1, 0.3x60 single needle with LTR, and deltoid with 1, 0.3x60 single needle without LTR but reported pain . Pistoning technique utilized. Similar R shoulder IR following TDN but with less pain.   Ther-ex  Standing Nautilus: 10#Standing ER with handles x 15 20#Standing IRwith handles x 15 30# tricep ext.With bar x 20 30# scap. Retractionwith handles x 20 30# chest press 20x.with handles 30# lat. Pull downswith bar x 20  Standing B shoulder AROM all planes.         PT Long Term Goals - 10/12/17 1351      PT LONG TERM GOAL #1   Title  Pt. independent with HEP to increase B UE muscle strength 1/2 muscle grade to improve pain-free mobility/ overhead reaching.      Baseline  B UE muscle strength grossly 4/5 MMT (increase pain with R sh. MMT).  Time  4    Period  Weeks    Status  New    Target Date  10/20/17      PT LONG TERM GOAL #2   Title  Pt. will increase FOTO to 71 to improve pain-free mobility with R UE.      Baseline  FOTO baseline 53/ goal 71.      Time  4    Period  Weeks    Status  New    Target Date  10/20/17      PT LONG TERM GOAL #3   Title  Pt. will report no tenderness with R shoulder/ deltoid/ UT palpation to improve pain-free mobility.      Baseline  (+) R sh. tenderness.     Time  4    Period  Weeks    Status  New    Target Date  10/20/17            Plan - 10/20/17 1729     Clinical Impression Statement  Pt demonstrates similar shoulder IR following dry needling today but with slightly less pain. She is able to participate in all exercises as instructed. Pt encouraged to continue HEP and follow-up as scheduled. She will need updated outcome measures and goals at next visit to determine recert vs discharge.    Rehab Potential  Good    PT Frequency  2x / week    PT Duration  4 weeks    PT Treatment/Interventions  ADLs/Self Care Home Management;Gait training;Stair training;Functional mobility training;Therapeutic activities;Therapeutic exercise;Balance training;Patient/family education;Neuromuscular re-education;Manual techniques;Passive range of motion;Energy conservation    PT Next Visit Plan  Outcome measures, goals, recert vs discharge    PT Home Exercise Plan  See HEP       Patient will benefit from skilled therapeutic intervention in order to improve the following deficits and impairments:  Abnormal gait, Improper body mechanics, Pain, Postural dysfunction, Decreased mobility, Decreased coordination, Decreased activity tolerance, Decreased endurance, Decreased range of motion, Decreased strength, Difficulty walking, Decreased safety awareness, Decreased balance  Visit Diagnosis: Muscle weakness (generalized)  Chronic right shoulder pain     Problem List Patient Active Problem List   Diagnosis Date Noted  . Vitamin D deficiency 12/16/2015  . Difficulty walking 12/12/2015  . Weakness of lower extremity 12/12/2015  . Iron deficiency anemia due to chronic blood loss 12/04/2015  . Vitamin B 12 deficiency 12/04/2015  . Vitamin D insufficiency 12/04/2015  . Indication for care in labor or delivery 09/03/2014  . S/P cesarean section 09/03/2014  . Supervision of normal pregnancy 09/02/2014  . H/O cesarean section complicating pregnancy 11/94/1740   Phillips Grout PT, DPT, GCS  Huprich,Jason 10/20/2017, 5:36 PM  [facility] [address]. [place], [place], [address] Phone: [phone]   Fax:  [phone]  Name: Lexine Jaspers MRN: [id number] Date of Birth: [birth year]

## 2017-10-21 ENCOUNTER — Encounter: Payer: Self-pay | Admitting: Physical Therapy

## 2017-10-21 ENCOUNTER — Ambulatory Visit: Payer: BLUE CROSS/BLUE SHIELD | Admitting: Physical Therapy

## 2017-10-21 DIAGNOSIS — M6281 Muscle weakness (generalized): Secondary | ICD-10-CM | POA: Diagnosis not present

## 2017-10-21 DIAGNOSIS — G8929 Other chronic pain: Secondary | ICD-10-CM

## 2017-10-21 DIAGNOSIS — M542 Cervicalgia: Secondary | ICD-10-CM

## 2017-10-21 DIAGNOSIS — M25511 Pain in right shoulder: Secondary | ICD-10-CM

## 2017-10-21 NOTE — Therapy (Signed)
[facility] [address] [place], [place], [address] Phone: [phone]   Fax:  [phone]  Physical Therapy Treatment  Patient Details  Name: Lisa Walker MRN: [id number] Date of Birth: 1985-06-13 Referring Provider: Dr. Jennings Books   Encounter Date: 10/21/2017  PT End of Session - 10/21/17 1808    Visit Number  8    Number of Visits  10    Date for PT Re-Evaluation  11/18/17    PT Start Time  1657    PT Stop Time  7858    PT Time Calculation (min)  51 min    Activity Tolerance  Patient tolerated treatment well    Behavior During Therapy  [facility] for tasks assessed/performed       Past Medical History:  Diagnosis Date  . Anemia   . Medical history non-contributory   . PONV (postoperative nausea and vomiting)     Past Surgical History:  Procedure Laterality Date  . CESAREAN SECTION N/A 2011   performed in [place]  . CESAREAN SECTION N/A 09/03/2014   Procedure: repeat CESAREAN SECTION;  Surgeon: Aletha Halim, MD;  Location: [facility] ORS;  Service: Obstetrics;  Laterality: N/A;    There were no vitals filed for this visit.  Subjective Assessment - 10/21/17 1804    Subjective  Pt. reports that she injured her knee going [place] a slide at the park with [place].  Pt. reports arm has is in no pain and experienced min. soreness after last treatment.      Patient is accompained by:  Family member    Pertinent History  pt. known to PT clinic.  See past medical history.      Limitations  House hold activities;Lifting    Patient Stated Goals  Decrease neck/ shoulder pain.      Currently in Pain?  No/denies    Pain Score  0-No pain       Treatment:  There.ex.:   Standing Nautilus:  10# Standing ER x15 30# Standing IR with handles x15 50# tricep ext. With bar 50# scap. Retraction with bar 40# B sh. Adduction with handles 50# chest press 20x. With bar 40# lat. Pull downs with bar  Standing B shoulder AROM all  planes.    Manual tx.:   STM to R deltoid/ proximal biceps musculature 8 min.  STM to cervical neck/ trap/ scm 6 min IR/ER stretch applied 6 min. Flexion/ Abduction of R shoulder 4 min.   Trigger point dry needling to R deltoid (2 needles)  Ice to R shoulder after tx. Session.       PT Education - 10/21/17 1807    Education provided  Yes    Education Details  Pt. instructed to use ice for inflammation in R deltoid area.    Person(s) Educated  Patient    Methods  Explanation    Comprehension  Verbalized understanding          PT Long Term Goals - 10/21/17 1815      PT LONG TERM GOAL #1   Title  Pt. independent with HEP to increase B UE muscle strength 1/2 muscle grade to improve pain-free mobility/ overhead reaching.      Baseline  B UE muscle strength grossly 4/5 MMT (increase pain with R sh. MMT).  4+/5 on B UE    Time  4    Period  Weeks    Status  Achieved    Target Date  11/18/17  PT LONG TERM GOAL #2   Title  Pt. will increase FOTO to 71 to improve pain-free mobility with R UE.      Baseline  FOTO baseline 53/ goal 71;  FOTO: 70/71    Time  4    Period  Weeks    Status  Partially Met    Target Date  11/18/17      PT LONG TERM GOAL #3   Title  Pt. will report no tenderness with R shoulder/ deltoid/ UT palpation to improve pain-free mobility.      Baseline  (+) R sh. tenderness.     Time  4    Period  Weeks    Status  Partially Met    Target Date  11/18/17            Plan - 10/21/17 1809    Clinical Impression Statement  Pt. demonstrated slight increase in ER of R shoulder today upon arrival to clinic.  Pt. reports no new pain with AROM, and slight tenderness with palpation of area in R upper deltoid.  Pt. is progressing well with ther ex for shoulder strengthening and stability.  Discharge planning was discussed today due to significant improvements made over past therapy sessions.  Pt. was agreeable to discharging in near future.  Pt.  will be reassed in 2 weeks to discuss if more tx is necessary.    Clinical Presentation  Stable    Clinical Decision Making  Low    Rehab Potential  Good    PT Frequency  Biweekly    PT Duration  4 weeks    PT Treatment/Interventions  ADLs/Self Care Home Management;Gait training;Stair training;Functional mobility training;Therapeutic activities;Therapeutic exercise;Balance training;Patient/family education;Neuromuscular re-education;Manual techniques;Passive range of motion;Energy conservation    PT Next Visit Plan  Assess movement and pt. being comfortable with discharge options.    PT Home Exercise Plan  See HEP       Patient will benefit from skilled therapeutic intervention in order to improve the following deficits and impairments:  Abnormal gait, Improper body mechanics, Pain, Postural dysfunction, Decreased mobility, Decreased coordination, Decreased activity tolerance, Decreased endurance, Decreased range of motion, Decreased strength, Difficulty walking, Decreased safety awareness, Decreased balance  Visit Diagnosis: Muscle weakness (generalized)  Chronic right shoulder pain  Pain in neck     Problem List Patient Active Problem List   Diagnosis Date Noted  . Vitamin D deficiency 12/16/2015  . Difficulty walking 12/12/2015  . Weakness of lower extremity 12/12/2015  . Iron deficiency anemia due to chronic blood loss 12/04/2015  . Vitamin B 12 deficiency 12/04/2015  . Vitamin D insufficiency 12/04/2015  . Indication for care in labor or delivery 09/03/2014  . S/P cesarean section 09/03/2014  . Supervision of normal pregnancy 09/02/2014  . H/O cesarean section complicating pregnancy 24/82/5003   Pura Spice, PT, DPT # [place], SPT 10/22/2017, 12:11 PM  [place] [facility] [address] [place], [place], [address] Phone: [phone]   Fax:  [phone]  Name: Lisa Walker MRN: [id number] Date of Birth:  11-15-1985

## 2017-11-02 ENCOUNTER — Ambulatory Visit: Payer: BLUE CROSS/BLUE SHIELD | Attending: Neurology | Admitting: Physical Therapy

## 2017-11-02 DIAGNOSIS — G8929 Other chronic pain: Secondary | ICD-10-CM | POA: Diagnosis present

## 2017-11-02 DIAGNOSIS — M542 Cervicalgia: Secondary | ICD-10-CM | POA: Diagnosis present

## 2017-11-02 DIAGNOSIS — M6281 Muscle weakness (generalized): Secondary | ICD-10-CM | POA: Insufficient documentation

## 2017-11-02 DIAGNOSIS — M25511 Pain in right shoulder: Secondary | ICD-10-CM | POA: Diagnosis present

## 2017-11-02 NOTE — Therapy (Signed)
[facility] [address] [place], [place], [address] Phone: [phone]   Fax:  [phone]  Physical Therapy Treatment  Patient Details  Name: Lisa Walker MRN: [id number] Date of Birth: [birth year] Referring Provider: Dr. Jennings Books   Encounter Date: 11/02/2017  PT End of Session - 11/02/17 1822    Visit Number  9    Number of Visits  10    Date for PT Re-Evaluation  11/18/17    PT Start Time  1601    PT Stop Time  1651    PT Time Calculation (min)  50 min    Activity Tolerance  Patient tolerated treatment well    Behavior During Therapy  [facility] for tasks assessed/performed       Past Medical History:  Diagnosis Date  . Anemia   . Medical history non-contributory   . PONV (postoperative nausea and vomiting)     Past Surgical History:  Procedure Laterality Date  . CESAREAN SECTION N/A 2011   performed in [place]  . CESAREAN SECTION N/A 09/03/2014   Procedure: repeat CESAREAN SECTION;  Surgeon: Aletha Halim, MD;  Location: [facility] ORS;  Service: Obstetrics;  Laterality: N/A;    There were no vitals filed for this visit.  Subjective Assessment - 11/02/17 1821    Subjective  Pt. reports that she is doing much better with ROM in R arm and has no pain in R arm. Pt. reports pain in neck to be 7/10 however.      Patient is accompained by:  Family member    Pertinent History  pt. known to PT clinic.  See past medical history.      Limitations  House hold activities;Lifting    Patient Stated Goals  Decrease neck/ shoulder pain.      Currently in Pain?  Yes    Pain Score  7     Pain Location  Neck    Pain Orientation  Posterior;Right;Left    Pain Descriptors / Indicators  Sore;Tender    Pain Type  Chronic pain    Pain Onset  More than a month ago    Pain Frequency  Constant        Treatment:   There.ex.:     Standing Nautilus:  10# Standing ER x15 30# Standing IR with handles x15 50# tricep ext. with bar x15 60# scap.  Retraction with bar x 20 40# B sh. Adduction with handles x 15 60# chest press with bar x15 60# lat. Pull downs with bar x15       Manual tx.:     STM to R deltoid/ proximal biceps musculature 4 min.   STM to cervical neck/ trap/ scm 24 min (trigger point release to cervical and trap areas) Suboccipital release 2x30 sec    MH applied to upper thoracic/cervical region during prone STM's         PT Long Term Goals - 11/02/17 1825      PT LONG TERM GOAL #1   Title  Pt. independent with HEP to increase B UE muscle strength 1/2 muscle grade to improve pain-free mobility/ overhead reaching.      Baseline  B UE muscle strength grossly 4/5 MMT (increase pain with R sh. MMT).  4+/5 on B UE    Time  4    Period  Weeks    Status  Achieved    Target Date  11/18/17      PT LONG TERM GOAL #2  Title  Pt. will increase FOTO to 71 to improve pain-free mobility with R UE.      Baseline  FOTO baseline 53/ goal 71;  FOTO: 70/71    Time  4    Period  Weeks    Status  Partially Met    Target Date  11/18/17      PT LONG TERM GOAL #3   Title  Pt. will report no tenderness with R shoulder/ deltoid/ UT palpation to improve pain-free mobility.      Baseline  (+) R sh. tenderness.     Time  4    Period  Weeks    Status  Achieved    Target Date  11/18/17            Plan - 11/02/17 1823    Clinical Impression Statement  Pt. performed well with ther ex, making progressions with weight tolerance while keeping same number of reps.  Discharge was discussed after explanation of significant improvements made in the R arm/shoulder with ROM and decrease in pain.  Pt. receptive and would like to continue therapy for one more visit to address neck issues.  Plan is to discharge at next visit.    Clinical Presentation  Stable    Clinical Decision Making  Low    Rehab Potential  Good    PT Frequency  Biweekly    PT Duration  4 weeks    PT Treatment/Interventions  ADLs/Self Care Home  Management;Gait training;Stair training;Functional mobility training;Therapeutic activities;Therapeutic exercise;Balance training;Patient/family education;Neuromuscular re-education;Manual techniques;Passive range of motion;Energy conservation    PT Next Visit Plan  Discharge next visit if no new issues.     PT Home Exercise Plan  See HEP       Patient will benefit from skilled therapeutic intervention in order to improve the following deficits and impairments:  Abnormal gait, Improper body mechanics, Pain, Postural dysfunction, Decreased mobility, Decreased coordination, Decreased activity tolerance, Decreased endurance, Decreased range of motion, Decreased strength, Difficulty walking, Decreased safety awareness, Decreased balance  Visit Diagnosis: Muscle weakness (generalized)  Chronic right shoulder pain     Problem List Patient Active Problem List   Diagnosis Date Noted  . Vitamin D deficiency 12/16/2015  . Difficulty walking 12/12/2015  . Weakness of lower extremity 12/12/2015  . Iron deficiency anemia due to chronic blood loss 12/04/2015  . Vitamin B 12 deficiency 12/04/2015  . Vitamin D insufficiency 12/04/2015  . Indication for care in labor or delivery 09/03/2014  . S/P cesarean section 09/03/2014  . Supervision of normal pregnancy 09/02/2014  . H/O cesarean section complicating pregnancy 48/88/9169   Pura Spice, PT, DPT # [place], SPT 11/03/2017, 8:45 AM  [facility] [address] [place], [place], [address] Phone: [phone]   Fax:  [phone]  Name: Lisa Walker MRN: [id number] Date of Birth: [birth year]

## 2017-11-03 ENCOUNTER — Encounter: Payer: Self-pay | Admitting: Physical Therapy

## 2017-11-16 ENCOUNTER — Ambulatory Visit: Payer: BLUE CROSS/BLUE SHIELD

## 2017-11-16 DIAGNOSIS — M25511 Pain in right shoulder: Secondary | ICD-10-CM

## 2017-11-16 DIAGNOSIS — G8929 Other chronic pain: Secondary | ICD-10-CM

## 2017-11-16 DIAGNOSIS — M542 Cervicalgia: Secondary | ICD-10-CM

## 2017-11-16 DIAGNOSIS — M6281 Muscle weakness (generalized): Secondary | ICD-10-CM | POA: Diagnosis not present

## 2017-11-16 NOTE — Patient Instructions (Signed)
Access Code: [email or web address]  URL: [email or web address]  Date: 11/16/2017  Prepared by: Roxana Hires   Exercises  Seated Upper Trapezius Stretch - 3 sets - 30 seconds to each side hold - 2x daily - 7x weekly  Seated Levator Scapulae Stretch - 10 reps - 3 sets - 30 seconds to each side hold - 1x daily - 7x weekly  Seated Assisted Cervical Rotation with Towel - 3 sets - 30 seconds to each side hold - 1x daily - 7x weekly  Supine Suboccipital Release with Tennis Balls - Find trigger points and hold 60-90 seconds or until muscles relax hold - 1x daily - 7x weekly  Supine Deep Neck Flexor Training - Hold - 5 reps - 1 sets - 5 seconds hold - 3x daily - 7x weekly

## 2017-11-16 NOTE — Therapy (Signed)
[facility] [address] [place], [place], [address] Phone: [phone]   Fax:  [phone]  Physical Therapy Treatment/Discharge  Dates of reporting period  09/22/17   to   11/16/17  Patient Details  Name: Lisa Walker MRN: [id number] Date of Birth: 1985/08/15 Referring Provider: Dr. Jennings Books   Encounter Date: 11/16/2017  PT End of Session - 11/16/17 1608    Visit Number  10    Number of Visits  10    Date for PT Re-Evaluation  11/18/17    PT Start Time  1600    PT Stop Time  1650    PT Time Calculation (min)  50 min    Activity Tolerance  Patient tolerated treatment well    Behavior During Therapy  [facility] for tasks assessed/performed       Past Medical History:  Diagnosis Date  . Anemia   . Medical history non-contributory   . PONV (postoperative nausea and vomiting)     Past Surgical History:  Procedure Laterality Date  . CESAREAN SECTION N/A 2011   performed in [place]  . CESAREAN SECTION N/A 09/03/2014   Procedure: repeat CESAREAN SECTION;  Surgeon: Aletha Halim, MD;  Location: [facility] ORS;  Service: Obstetrics;  Laterality: N/A;    There were no vitals filed for this visit.  Subjective Assessment - 11/16/17 1600    Subjective  Pt reports 5/10 L sided posterior neck pain today. She has a history of intermittent neck pain since her cervical surgery in 2017. She reports that her R shoulder pain has almost entirely resolved at this time. She gets infrequent R shoulder pain when using utensils at the table.     Patient is accompained by:  Family member    Pertinent History  pt. known to PT clinic.  See past medical history.      Limitations  House hold activities;Lifting    Patient Stated Goals  Decrease neck/ shoulder pain.      Currently in Pain?  Yes    Pain Score  5     Pain Location  Neck    Pain Orientation  Posterior;Left    Pain Descriptors / Indicators  Sore;Sharp    Pain Type  Chronic pain    Pain Onset  More  than a month ago           TREATMENT  Ther-ex Supine deep neck flexor strengthening 5s x 5, pt provided extensive verbal and tactile cues; Extensive education about home exercise program;  Manual Therapy Extensive STM to L upper trap, cervical extensors with ischemic compression and trigger point release, pt with notable tenderness; Upper trap and levator stretches 30s hold each with education about how to compete at home; Pt educated about how to tape 2 tennis balls together for self cervical STM as well as single tennis ball on floor or wall for upper trap massage; Reviewed discharge instructions with patient; MH applied to upper thoracic/cervical region at end of session (unbilled);                     PT Education - 11/16/17 1608    Education provided  Yes    Education Details  HEP and discharge planes    Person(s) Educated  Patient;Spouse    Methods  Explanation    Comprehension  Verbalized understanding          PT Long Term Goals - 11/17/17 1028      PT LONG  TERM GOAL #1   Title  Pt. independent with HEP to increase B UE muscle strength 1/2 muscle grade to improve pain-free mobility/ overhead reaching.      Baseline  B UE muscle strength grossly 4/5 MMT (increase pain with R sh. MMT).  4+/5 on B UE    Time  4    Period  Weeks    Status  Achieved      PT LONG TERM GOAL #2   Title  Pt. will increase FOTO to 71 to improve pain-free mobility with R UE.      Baseline  FOTO baseline 53/ goal 71;  FOTO: 70/71    Time  4    Period  Weeks    Status  Partially Met      PT LONG TERM GOAL #3   Title  Pt. will report no tenderness with R shoulder/ deltoid/ UT palpation to improve pain-free mobility.      Baseline  (+) R sh. tenderness.     Time  4    Period  Weeks    Status  Achieved            Plan - 11/17/17 1027    Clinical Impression Statement  Pt is being discharged on this date having reached her goals of almost full resolution of  her R shoulder pain. She continues to report very mild pain with heavy palpation as well as intermittent pain when using utensils at the table. She is tender to palpation over ther L cervical extensors especially her L upper trap with notable trigger points. Pt issued a home exercise program which included self massage and stretching of neck muscles. Pt also instructed in deep neck flexor strengthening. Pt will be discharge on this date having met her goals and with an independent home program.     Rehab Potential  Good    PT Frequency  Biweekly    PT Duration  4 weeks    PT Treatment/Interventions  ADLs/Self Care Home Management;Gait training;Stair training;Functional mobility training;Therapeutic activities;Therapeutic exercise;Balance training;Patient/family education;Neuromuscular re-education;Manual techniques;Passive range of motion;Energy conservation    PT Next Visit Plan  Discharge    PT Home Exercise Plan  See HEP       Patient will benefit from skilled therapeutic intervention in order to improve the following deficits and impairments:  Abnormal gait, Improper body mechanics, Pain, Postural dysfunction, Decreased mobility, Decreased coordination, Decreased activity tolerance, Decreased endurance, Decreased range of motion, Decreased strength, Difficulty walking, Decreased safety awareness, Decreased balance  Visit Diagnosis: Muscle weakness (generalized)  Chronic right shoulder pain  Pain in neck     Problem List Patient Active Problem List   Diagnosis Date Noted  . Vitamin D deficiency 12/16/2015  . Difficulty walking 12/12/2015  . Weakness of lower extremity 12/12/2015  . Iron deficiency anemia due to chronic blood loss 12/04/2015  . Vitamin B 12 deficiency 12/04/2015  . Vitamin D insufficiency 12/04/2015  . Indication for care in labor or delivery 09/03/2014  . S/P cesarean section 09/03/2014  . Supervision of normal pregnancy 09/02/2014  . H/O cesarean section  complicating pregnancy 16/96/7893   Phillips Grout PT, DPT, GCS  Huprich,Jason 11/17/2017, 4:38 PM  [facility] [address] [place], [place], [address] Phone: [phone]   Fax:  [phone]  Name: Tzipporah Nagorski MRN: [id number] Date of Birth: January 25, 1986

## 2017-12-28 ENCOUNTER — Inpatient Hospital Stay: Payer: BLUE CROSS/BLUE SHIELD | Attending: Oncology

## 2017-12-28 ENCOUNTER — Other Ambulatory Visit: Payer: Self-pay | Admitting: Neurology

## 2017-12-28 DIAGNOSIS — D509 Iron deficiency anemia, unspecified: Secondary | ICD-10-CM | POA: Insufficient documentation

## 2017-12-28 DIAGNOSIS — M7918 Myalgia, other site: Secondary | ICD-10-CM

## 2017-12-28 DIAGNOSIS — D500 Iron deficiency anemia secondary to blood loss (chronic): Secondary | ICD-10-CM

## 2017-12-28 LAB — IRON AND TIBC
Iron: 65 ug/dL (ref 28–170)
SATURATION RATIOS: 22 % (ref 10.4–31.8)
TIBC: 294 ug/dL (ref 250–450)
UIBC: 229 ug/dL

## 2017-12-28 LAB — CBC WITH DIFFERENTIAL/PLATELET
BASOS PCT: 1 %
Basophils Absolute: 0 10*3/uL (ref 0–0.1)
Eosinophils Absolute: 0.2 10*3/uL (ref 0–0.7)
Eosinophils Relative: 3 %
HEMATOCRIT: 37.5 % (ref 35.0–47.0)
HEMOGLOBIN: 12.5 g/dL (ref 12.0–16.0)
Lymphocytes Relative: 28 %
Lymphs Abs: 1.9 10*3/uL (ref 1.0–3.6)
MCH: 28.2 pg (ref 26.0–34.0)
MCHC: 33.3 g/dL (ref 32.0–36.0)
MCV: 84.7 fL (ref 80.0–100.0)
MONOS PCT: 7 %
Monocytes Absolute: 0.4 10*3/uL (ref 0.2–0.9)
NEUTROS ABS: 4.2 10*3/uL (ref 1.4–6.5)
NEUTROS PCT: 61 %
Platelets: 244 10*3/uL (ref 150–440)
RBC: 4.43 MIL/uL (ref 3.80–5.20)
RDW: 12.8 % (ref 11.5–14.5)
WBC: 6.7 10*3/uL (ref 3.6–11.0)

## 2017-12-28 LAB — FERRITIN: Ferritin: 58 ng/mL (ref 11–307)

## 2017-12-31 ENCOUNTER — Other Ambulatory Visit: Payer: BLUE CROSS/BLUE SHIELD

## 2018-01-12 ENCOUNTER — Ambulatory Visit
Admission: RE | Admit: 2018-01-12 | Discharge: 2018-01-12 | Disposition: A | Discharge status: Home / Self Care | Payer: BLUE CROSS/BLUE SHIELD | Source: Ambulatory Visit | Attending: Neurology | Admitting: Neurology

## 2018-01-12 DIAGNOSIS — M7918 Myalgia, other site: Secondary | ICD-10-CM | POA: Diagnosis present

## 2018-01-12 DIAGNOSIS — M899 Disorder of bone, unspecified: Secondary | ICD-10-CM | POA: Insufficient documentation

## 2018-01-12 DIAGNOSIS — M25511 Pain in right shoulder: Secondary | ICD-10-CM | POA: Insufficient documentation

## 2018-03-01 ENCOUNTER — Inpatient Hospital Stay: Payer: BLUE CROSS/BLUE SHIELD | Attending: Oncology

## 2018-03-01 DIAGNOSIS — D509 Iron deficiency anemia, unspecified: Secondary | ICD-10-CM | POA: Diagnosis not present

## 2018-03-01 DIAGNOSIS — D500 Iron deficiency anemia secondary to blood loss (chronic): Secondary | ICD-10-CM

## 2018-03-01 LAB — CBC WITH DIFFERENTIAL/PLATELET
Band Neutrophils: 3 %
Basophils Absolute: 0.1 10*3/uL (ref 0.0–0.1)
Basophils Relative: 1 %
EOS ABS: 0.1 10*3/uL (ref 0.0–0.5)
Eosinophils Relative: 1 %
HEMATOCRIT: 35.9 % — AB (ref 36.0–46.0)
HEMOGLOBIN: 11.7 g/dL — AB (ref 12.0–15.0)
LYMPHS ABS: 2.3 10*3/uL (ref 0.7–4.0)
Lymphocytes Relative: 41 %
MCH: 27.7 pg (ref 26.0–34.0)
MCHC: 32.6 g/dL (ref 30.0–36.0)
MCV: 84.9 fL (ref 80.0–100.0)
MONO ABS: 0.3 10*3/uL (ref 0.1–1.0)
MONOS PCT: 6 %
NEUTROS ABS: 2.9 10*3/uL (ref 1.7–7.7)
Neutrophils Relative %: 48 %
Platelets: 237 10*3/uL (ref 150–400)
RBC: 4.23 MIL/uL (ref 3.87–5.11)
RDW: 12.3 % (ref 11.5–15.5)
WBC: 5.6 10*3/uL (ref 4.0–10.5)
nRBC: 0 % (ref 0.0–0.2)

## 2018-03-01 LAB — IRON AND TIBC
Iron: 35 ug/dL (ref 28–170)
Saturation Ratios: 13 % (ref 10.4–31.8)
TIBC: 267 ug/dL (ref 250–450)
UIBC: 232 ug/dL

## 2018-03-01 LAB — FERRITIN: FERRITIN: 83 ng/mL (ref 11–307)

## 2018-03-04 ENCOUNTER — Other Ambulatory Visit: Payer: BLUE CROSS/BLUE SHIELD

## 2018-03-25 ENCOUNTER — Other Ambulatory Visit: Payer: BLUE CROSS/BLUE SHIELD

## 2018-03-25 DIAGNOSIS — D480 Neoplasm of uncertain behavior of bone and articular cartilage: Secondary | ICD-10-CM | POA: Insufficient documentation

## 2018-03-31 ENCOUNTER — Encounter: Payer: Self-pay | Admitting: Oncology

## 2018-03-31 ENCOUNTER — Inpatient Hospital Stay: Payer: BLUE CROSS/BLUE SHIELD | Attending: Oncology | Admitting: Oncology

## 2018-03-31 ENCOUNTER — Encounter: Payer: Self-pay | Admitting: Physical Therapy

## 2018-03-31 ENCOUNTER — Inpatient Hospital Stay: Payer: BLUE CROSS/BLUE SHIELD

## 2018-03-31 ENCOUNTER — Ambulatory Visit: Payer: BLUE CROSS/BLUE SHIELD | Admitting: Physical Therapy

## 2018-03-31 ENCOUNTER — Other Ambulatory Visit: Payer: Self-pay

## 2018-03-31 VITALS — BP 103/70 | HR 66 | Temp 96.6°F | Resp 18 | Wt 148.8 lb

## 2018-03-31 DIAGNOSIS — N920 Excessive and frequent menstruation with regular cycle: Secondary | ICD-10-CM | POA: Diagnosis not present

## 2018-03-31 DIAGNOSIS — G8929 Other chronic pain: Secondary | ICD-10-CM

## 2018-03-31 DIAGNOSIS — M542 Cervicalgia: Secondary | ICD-10-CM

## 2018-03-31 DIAGNOSIS — M6281 Muscle weakness (generalized): Secondary | ICD-10-CM

## 2018-03-31 DIAGNOSIS — D500 Iron deficiency anemia secondary to blood loss (chronic): Secondary | ICD-10-CM | POA: Insufficient documentation

## 2018-03-31 DIAGNOSIS — M25511 Pain in right shoulder: Secondary | ICD-10-CM | POA: Insufficient documentation

## 2018-03-31 LAB — CBC WITH DIFFERENTIAL/PLATELET
ABS IMMATURE GRANULOCYTES: 0.02 10*3/uL (ref 0.00–0.07)
Basophils Absolute: 0 10*3/uL (ref 0.0–0.1)
Basophils Relative: 1 %
EOS ABS: 0.2 10*3/uL (ref 0.0–0.5)
Eosinophils Relative: 3 %
HEMATOCRIT: 34.8 % — AB (ref 36.0–46.0)
Hemoglobin: 11.3 g/dL — ABNORMAL LOW (ref 12.0–15.0)
Immature Granulocytes: 0 %
LYMPHS ABS: 1.9 10*3/uL (ref 0.7–4.0)
Lymphocytes Relative: 33 %
MCH: 28 pg (ref 26.0–34.0)
MCHC: 32.5 g/dL (ref 30.0–36.0)
MCV: 86.1 fL (ref 80.0–100.0)
MONOS PCT: 7 %
Monocytes Absolute: 0.4 10*3/uL (ref 0.1–1.0)
NEUTROS ABS: 3.3 10*3/uL (ref 1.7–7.7)
Neutrophils Relative %: 56 %
Platelets: 227 10*3/uL (ref 150–400)
RBC: 4.04 MIL/uL (ref 3.87–5.11)
RDW: 12.9 % (ref 11.5–15.5)
WBC: 5.9 10*3/uL (ref 4.0–10.5)
nRBC: 0 % (ref 0.0–0.2)

## 2018-03-31 LAB — COMPREHENSIVE METABOLIC PANEL
ALBUMIN: 4.2 g/dL (ref 3.5–5.0)
ALK PHOS: 67 U/L (ref 38–126)
ALT: 18 U/L (ref 0–44)
AST: 19 U/L (ref 15–41)
Anion gap: 8 (ref 5–15)
BUN: 15 mg/dL (ref 6–20)
CALCIUM: 8.9 mg/dL (ref 8.9–10.3)
CO2: 26 mmol/L (ref 22–32)
Chloride: 104 mmol/L (ref 98–111)
Creatinine, Ser: 0.63 mg/dL (ref 0.44–1.00)
GFR calc Af Amer: >60 mL/min (ref >60)
GFR calc non Af Amer: >60 mL/min (ref >60)
GLUCOSE: 105 mg/dL — AB (ref 70–99)
Potassium: 4.1 mmol/L (ref 3.5–5.1)
SODIUM: 138 mmol/L (ref 135–145)
Total Bilirubin: 0.7 mg/dL (ref 0.3–1.2)
Total Protein: 7.8 g/dL (ref 6.5–8.1)

## 2018-03-31 LAB — FOLATE: Folate: 34 ng/mL (ref >5.9)

## 2018-03-31 LAB — IRON AND TIBC
Iron: 60 ug/dL (ref 28–170)
Saturation Ratios: 20 % (ref 10.4–31.8)
TIBC: 300 ug/dL (ref 250–450)
UIBC: 240 ug/dL

## 2018-03-31 LAB — VITAMIN B12: VITAMIN B 12: 472 pg/mL (ref 180–914)

## 2018-03-31 LAB — FERRITIN: Ferritin: 38 ng/mL (ref 11–307)

## 2018-03-31 MED ORDER — FERROUS SULFATE 325 (65 FE) MG PO TBEC: 325.0000 mg | DELAYED_RELEASE_TABLET | Freq: Three times a day (TID) | ORAL | 3 refills | Status: DC

## 2018-03-31 NOTE — Progress Notes (Signed)
Patient here for follow up. Complains of having balance problems in the mornings, this started about 2 months ago.

## 2018-03-31 NOTE — Progress Notes (Signed)
[facility] Initial consultation  Patient Care Team: Minna Antis as PCP - General (Physician Assistant) REASON FOR VISIT Follow up for treatment of iron deficiency anemia  HISTORY OF PRESENTING ILLNESS: Lisa Walker 32 y.o. [place] female with past medical history listed as below is referred by primary care provider to me for evaluation and management of iron deficiency anemia. Patient speaks English fluently and she tells me she is able to understand me well. Her husband came to the room and joined our conversation later. Patient reports having history of heavy menstrual period. She also had 2 termination of pregnancy. She was found to have iron deficiency anemia and has been on iron supplementation orally for the past year. However her iron store does not improve too much. Patient is still feels very fatigue and weakness. 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 She had the surgery for resection of the cyst and currently her neurologic symptoms are better. She is married and has 2 kids, youngest kid is 72 years old. She is not breast feeding. She is not a vegetarian and she consume red meat on  regular basis. Denies any family history of cancer.  Denies any stomach discomfort, chest pain, shortness of breath, abdominal pain, black stool, or blood in the stool. Denies any weight loss, fever or chills,. Reports fatigue, finger bone pain.  INTERVAL HISTORY Patient presents for management of anemia.  During interval, patient went to [facility] for evaluation of a painful right upper extremity mass. S/p incision biopsy. 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   Iron deficiency anemia: previously recieved IV feraheme. She was on oral iron supplementation, interrupted for a few months when she is undergoing work up for muscle mass and when was taking NSAID/pain pills.   Approximately a few weeks ago, she restarted taking oral iron tablets. tolerates well.   Review of Systems  Constitutional: Negative for appetite change, chills, fatigue, fever and unexpected weight change.  HENT:   Negative for hearing loss, nosebleeds, sore throat and voice change.   Eyes: Negative for eye problems.  Respiratory: Negative for chest tightness, cough and hemoptysis.   Cardiovascular: Negative for chest pain.  Gastrointestinal: Negative for abdominal distention, abdominal pain, blood in stool and constipation.  Endocrine: Negative for hot flashes.  Genitourinary: Negative for difficulty urinating, dysuria, frequency and hematuria.   Musculoskeletal: Negative for arthralgias, flank pain and myalgias.  Skin: Negative for itching and rash.  Neurological: Negative for dizziness, extremity weakness, headaches, light-headedness and numbness.  Hematological: Negative for adenopathy.  Psychiatric/Behavioral: Negative for confusion and decreased concentration. The patient is not nervous/anxious.     MEDICAL HISTORY: Past Medical History:  Diagnosis Date  . Anemia   . Medical history non-contributory   . PONV (postoperative nausea and vomiting)     SURGICAL HISTORY: Past Surgical History:  Procedure Laterality Date  . CESAREAN SECTION N/A 2011   performed in [place]  . CESAREAN SECTION N/A 09/03/2014   Procedure: repeat CESAREAN SECTION;  Surgeon: Aletha Halim, MD;  Location: [facility] ORS;  Service: Obstetrics;  Laterality: N/A;    SOCIAL HISTORY: Social History   Socioeconomic History  . Marital status: Married    Spouse name: Not on file  . Number of children: Not on file  . Years of education: Not on file  . Highest education level: Not on file  Occupational History  . Not on file  Social Needs  . Financial resource strain: Not on file  . Food insecurity:    Worry: Not on file    Inability: Not on file  . Transportation needs:    Medical: Not on file     Non-medical: Not on file  Tobacco Use  . Smoking status: Never Smoker  . Smokeless tobacco: Never Used  Substance and Sexual Activity  . Alcohol use: No    Alcohol/week: 0.0 standard drinks  . Drug use: No  . Sexual activity: Yes    Birth control/protection: None  Lifestyle  . Physical activity:    Days per week: Not on file    Minutes per session: Not on file  . Stress: Not on file  Relationships  . Social connections:    Talks on phone: Not on file    Gets together: Not on file    Attends religious service: Not on file    Active member of club or organization: Not on file    Attends meetings of clubs or organizations: Not on file    Relationship status: Not on file  . Intimate partner violence:    Fear of current or ex partner: Not on file    Emotionally abused: Not on file    Physically abused: Not on file    Forced sexual activity: Not on file  Other Topics Concern  . Not on file  Social History Narrative  . Not on file    FAMILY HISTORY Family History  Problem Relation Age of Onset  . Diabetes Father   . Diabetes Mother     ALLERGIES:  is allergic to contrast media [iodinated diagnostic agents]; multihance [gadobenate]; and pineapple.  MEDICATIONS:  Current Outpatient Medications  Medication Sig Dispense Refill  . ferrous sulfate 325 (65 FE) MG EC tablet Take 1 tablet (325 mg total) by mouth 3 (three) times daily with meals. 60 tablet 3   No current facility-administered medications for this visit.     PHYSICAL EXAMINATION:  ECOG PERFORMANCE STATUS: 0 - Asymptomatic   Vitals:   03/31/18 0838  BP: 103/70  Pulse: 66  Resp: 18  Temp: (!) 96.6 F (35.9 C)    Filed Weights   03/31/18 0838  Weight: 148 lb 13 oz (67.5 kg)     Physical Exam  Constitutional: She is oriented to person, place, and time and well-developed, well-nourished, and in no distress. No distress.  HENT:  Head: Normocephalic and atraumatic.  Nose: Nose normal.  Mouth/Throat:  Oropharynx is clear and moist. No oropharyngeal exudate.  Eyes: Pupils are equal, round, and reactive to light. Conjunctivae and EOM are normal. Left eye exhibits no discharge. No scleral icterus.  Neck: Normal range of motion. Neck supple. No JVD present.  Cardiovascular: Normal rate, regular rhythm and normal heart sounds.  No murmur heard. Pulmonary/Chest: Effort normal and breath sounds normal. No respiratory distress. She has no wheezes. She has no rales. She exhibits no tenderness.  Abdominal: Soft. Bowel sounds are normal. She exhibits no distension and no mass. There is no tenderness. There is no rebound.  Musculoskeletal: Normal range of motion. She exhibits no edema or tenderness.  Lymphadenopathy:    She has no cervical adenopathy.  Neurological: She is alert and oriented to person, place, and time. No cranial nerve deficit. She exhibits normal muscle tone. Coordination normal.  Skin: Skin is warm and dry. No rash noted. She is not diaphoretic. No erythema.  Psychiatric: Memory, affect and judgment normal.  LABORATORY DATA: I have personally reviewed the data as listed: Labs from [facility] showed 11/13/2016 ferritin 8 iron 39 TIBC 384 transferrin saturation 10. CBC hemoglobin 11.2 WBC 5.6 hematocrit 34 platelets 254 MCV 81.  Office Visit on 03/31/2018  Component Date Value Ref Range Status  . Vitamin B-12 03/31/2018 472  180 - 914 pg/mL Final   Comment: (NOTE) This assay is not validated for testing neonatal or myeloproliferative syndrome specimens for Vitamin B12 levels. Performed at [facility], [place] [address], [place], [place] [address]   . Iron 03/31/2018 60  28 - 170 ug/dL Final  . TIBC 03/31/2018 300  250 - 450 ug/dL Final  . Saturation Ratios 03/31/2018 20  10.4 - 31.8 % Final  . UIBC 03/31/2018 240  ug/dL Final   Performed at [facility], [address], [place], [place] [address]  . Folate 03/31/2018 34.0  >5.9 ng/mL Final    Performed at [facility], [address] [place], [place] [address]  . Ferritin 03/31/2018 38  11 - 307 ng/mL Final   Performed at [facility], [address] [place], [place] [address]  . Sodium 03/31/2018 138  135 - 145 mmol/L Final  . Potassium 03/31/2018 4.1  3.5 - 5.1 mmol/L Final  . Chloride 03/31/2018 104  98 - 111 mmol/L Final  . CO2 03/31/2018 26  22 - 32 mmol/L Final  . Glucose, Bld 03/31/2018 105* 70 - 99 mg/dL Final  . BUN 03/31/2018 15  6 - 20 mg/dL Final  . Creatinine, Ser 03/31/2018 0.63  0.44 - 1.00 mg/dL Final  . Calcium 03/31/2018 8.9  8.9 - 10.3 mg/dL Final  . Total Protein 03/31/2018 7.8  6.5 - 8.1 g/dL Final  . Albumin 03/31/2018 4.2  3.5 - 5.0 g/dL Final  . AST 03/31/2018 19  15 - 41 U/L Final  . ALT 03/31/2018 18  0 - 44 U/L Final  . Alkaline Phosphatase 03/31/2018 67  38 - 126 U/L Final  . Total Bilirubin 03/31/2018 0.7  0.3 - 1.2 mg/dL Final  . GFR calc non Af Amer 03/31/2018 >60  >60 mL/min Final  . GFR calc Af Amer 03/31/2018 >60  >60 mL/min Final  . Anion gap 03/31/2018 8  5 - 15 Final   Performed at [facility], [address], [place], [place] [address]  . WBC 03/31/2018 5.9  4.0 - 10.5 K/uL Final  . RBC 03/31/2018 4.04  3.87 - 5.11 MIL/uL Final  . Hemoglobin 03/31/2018 11.3* 12.0 - 15.0 g/dL Final  . HCT 03/31/2018 34.8* 36.0 - 46.0 % Final  . MCV 03/31/2018 86.1  80.0 - 100.0 fL Final  . MCH 03/31/2018 28.0  26.0 - 34.0 pg Final  . MCHC 03/31/2018 32.5  30.0 - 36.0 g/dL Final  . RDW 03/31/2018 12.9  11.5 - 15.5 % Final  . Platelets 03/31/2018 227  150 - 400 K/uL Final  . nRBC 03/31/2018 0.0  0.0 - 0.2 % Final  . Neutrophils Relative % 03/31/2018 56  % Final  . Neutro Abs 03/31/2018 3.3  1.7 - 7.7 K/uL Final  . Lymphocytes Relative 03/31/2018 33  % Final  . Lymphs Abs 03/31/2018 1.9  0.7 - 4.0 K/uL Final  . Monocytes Relative 03/31/2018 7  % Final  . Monocytes Absolute 03/31/2018 0.4  0.1 - 1.0 K/uL Final  .  Eosinophils Relative 03/31/2018 3  % Final  . Eosinophils Absolute 03/31/2018 0.2  0.0 -  0.5 K/uL Final  . Basophils Relative 03/31/2018 1  % Final  . Basophils Absolute 03/31/2018 0.0  0.0 - 0.1 K/uL Final  . Immature Granulocytes 03/31/2018 0  % Final  . Abs Immature Granulocytes 03/31/2018 0.02  0.00 - 0.07 K/uL Final   Performed at [facility], [address] [place], [place] [address]   Iron/TIBC/Ferritin/ %Sat    Component Value Date/Time   IRON 60 03/31/2018 0854   TIBC 300 03/31/2018 0854   FERRITIN 38 03/31/2018 0854   IRONPCTSAT 20 03/31/2018 0854   ASSESSMENT/PLAN 1. Iron deficiency anemia due to chronic blood loss    # Iron deficiency Anemia:  Labs reviewed and discussed with patient.  Hemoglobin 11.3, iron panel showed ferritin 38, saturation ratio 20.  TIBC 300. Advised patient to continue take oral ferrous sulfate 325 twice daily. Normal vitamin B12 and folate level. Refills sent to pharmacy.   All questions were answered. The patient knows to call the clinic with any problems, questions or concerns. Follow up in 3 months.   Earlie Server, MD, PhD Hematology Oncology [facility] Pager- [phone] 03/31/2018

## 2018-03-31 NOTE — Patient Instructions (Signed)
Access Code: [email or web address]  URL: [email or web address]  Date: 03/31/2018  Prepared by: Dorcas Carrow   Exercises  Standing Tricep Extensions with Resistance - 20 reps - 1 sets - 1x daily - 7x weekly  Standing Single Arm Elbow Flexion with Resistance - 20 reps - 1 sets - 1x daily - 7x weekly  Scapular Retraction with Resistance - 20 reps - 1 sets - 1x daily - 7x weekly  Shoulder External Rotation and Scapular Retraction with Resistance - 20 reps - 1 sets - 1x daily - 7x weekly  Standing Single Arm Shoulder Flexion with Resistance - 20 reps - 1 sets - 1x daily - 7x weekly  Upper Trapezius Stretch - 3 reps - 1 sets - 20 hold - 1x daily - 7x weekly  Squat with Chair Touch - 10 reps - 2 sets - 1x daily - 7x weekly

## 2018-03-31 NOTE — Therapy (Addendum)
[facility] [address] [place], [place], [address] Phone: [phone]   Fax:  [phone]  Physical Therapy Evaluation  Patient Details  Name: Lisa Walker MRN: [id number] Date of Birth: [birth year] Referring Provider (PT): Dr. Cherre Blanc   Encounter Date: 03/31/2018  PT End of Session - 04/24/18 1740    Visit Number  1    Number of Visits  8    Date for PT Re-Evaluation  04/28/18    PT Start Time  0732    PT Stop Time  0831    PT Time Calculation (min)  59 min    Activity Tolerance  Patient tolerated treatment well;Patient limited by pain    Behavior During Therapy  [facility] for tasks assessed/performed       Past Medical History:  Diagnosis Date  . Anemia   . Medical history non-contributory   . PONV (postoperative nausea and vomiting)     Past Surgical History:  Procedure Laterality Date  . CESAREAN SECTION N/A 2011   performed in [place]  . CESAREAN SECTION N/A 09/03/2014   Procedure: repeat CESAREAN SECTION;  Surgeon: Aletha Halim, MD;  Location: [facility] ORS;  Service: Obstetrics;  Laterality: N/A;    There were no vitals filed for this visit.   No pain in R shoulder. Pt. reports minimal c/o neck pain on R UT region. Pt. entered PT with use of soft cervical collar.       Pt. Reports balance issues for 1st 15-20 steps in the morning.    Slight R ulnar nerve involvement with stretches/ (-) medial/ radial.     Seated R shoulder AROM: WNL all planes of movement.  R UE MMT: 4/5 MMT except biceps/triceps 4+/5 MMT.        Manual tx.:  Supine UT/levator stretches 3x each.  Supine R shoulder AAROM all planes to tolerable end-range with slight OP.  Grade III AP/PA/inf. Mobs. 2x20 sec. Each.  R ulnar nerve glides.     PT Long Term Goals - 04/24/18 1750      PT LONG TERM GOAL #1   Title  Pt. independent with HEP to increase B UE muscle strength 1/2 muscle grade to improve pain-free mobility/ overhead reaching.       Baseline  L shoulder strength grossly 5/5 MMT except flexion 4+/5 MMT. R shoulder strength grossly 4/5 MMT, biceps/ triceps 4+/5 MMT. Grip strength: R 31#/L 52#.     Time  4    Period  Weeks    Status  New    Target Date  04/28/18      PT LONG TERM GOAL #2   Title  Pt. will increase FOTO to 73 to improve pain-free mobility with R UE.      Baseline  FOTO baseline 59/ goal 73.      Time  4    Period  Weeks    Status  New    Target Date  04/28/18      PT LONG TERM GOAL #3   Title  Pt. will report no tenderness with R shoulder/ deltoid/ UT palpation to improve pain-free mobility.      Baseline  (+) R sh. tenderness.     Time  4    Period  Weeks    Status  New    Target Date  04/28/18      PT LONG TERM GOAL #4   Title  Pt. will increase R grip strength to >40# to improve grasping/  UE functional mobility.      Baseline  Grip strength: R 31#/L 52#.     Time  4    Period  Weeks    Status  New    Target Date  04/28/18         Pt. is a pleasant 32 y/o female with chronic h/o neck/ R shoulder pain. Pt. currently reports no R shoulder pain (2 biopsies) and 5/10 neck pain. Pt. s/p neck surgery 2+ years ago and will occasionally wear cervical soft collar for pain mgmt. B shoulder AROM WNL (all planes of movement). L shoulder strength grossly 5/5 MMT except flexion 4+/5 MMT. R shoulder strength grossly 4/5 MMT, biceps/ triceps 4+/5 MMT. Grip strength: R 31#/L 52#. FOTO: 79/48. (-) Neers/ Hawkins. (-) Empty can test. Pt. will benefit from skilled PT services to develop a progressive HEP to improve R UE strength/ functional mobility.        Patient will benefit from skilled therapeutic intervention in order to improve the following deficits and impairments:  Abnormal gait, Improper body mechanics, Pain, Postural dysfunction, Decreased mobility, Decreased coordination, Decreased activity tolerance, Decreased endurance, Decreased range of motion, Decreased strength, Difficulty walking, Decreased  safety awareness, Decreased balance, Impaired UE functional use  Visit Diagnosis: Chronic right shoulder pain  Muscle weakness (generalized)  Pain in neck     Problem List Patient Active Problem List   Diagnosis Date Noted  . Vitamin D deficiency 12/16/2015  . Difficulty walking 12/12/2015  . Weakness of lower extremity 12/12/2015  . Iron deficiency anemia due to chronic blood loss 12/04/2015  . Vitamin B 12 deficiency 12/04/2015  . Vitamin D insufficiency 12/04/2015  . Indication for care in labor or delivery 09/03/2014  . S/P cesarean section 09/03/2014  . Supervision of normal pregnancy 09/02/2014  . H/O cesarean section complicating pregnancy 01/65/5374   Pura Spice, PT, DPT # [phone] 04/24/2018, 5:54 PM  [facility] [address] [place], [place], [address] Phone: [phone]   Fax:  [phone]  Name: Lisa Walker MRN: [id number] Date of Birth: 1986/04/26

## 2018-04-05 ENCOUNTER — Ambulatory Visit: Payer: BLUE CROSS/BLUE SHIELD | Admitting: Physical Therapy

## 2018-04-05 DIAGNOSIS — G8929 Other chronic pain: Secondary | ICD-10-CM

## 2018-04-05 DIAGNOSIS — M25511 Pain in right shoulder: Principal | ICD-10-CM

## 2018-04-05 DIAGNOSIS — M542 Cervicalgia: Secondary | ICD-10-CM

## 2018-04-05 DIAGNOSIS — M6281 Muscle weakness (generalized): Secondary | ICD-10-CM

## 2018-04-05 DIAGNOSIS — N920 Excessive and frequent menstruation with regular cycle: Secondary | ICD-10-CM | POA: Diagnosis not present

## 2018-04-07 ENCOUNTER — Ambulatory Visit: Payer: BLUE CROSS/BLUE SHIELD | Admitting: Physical Therapy

## 2018-04-08 ENCOUNTER — Ambulatory Visit: Payer: BLUE CROSS/BLUE SHIELD | Admitting: Physical Therapy

## 2018-04-08 DIAGNOSIS — G8929 Other chronic pain: Secondary | ICD-10-CM

## 2018-04-08 DIAGNOSIS — M542 Cervicalgia: Secondary | ICD-10-CM

## 2018-04-08 DIAGNOSIS — M6281 Muscle weakness (generalized): Secondary | ICD-10-CM

## 2018-04-08 DIAGNOSIS — M25511 Pain in right shoulder: Principal | ICD-10-CM

## 2018-04-08 DIAGNOSIS — N920 Excessive and frequent menstruation with regular cycle: Secondary | ICD-10-CM | POA: Diagnosis not present

## 2018-04-12 ENCOUNTER — Ambulatory Visit: Payer: BLUE CROSS/BLUE SHIELD | Admitting: Physical Therapy

## 2018-04-12 DIAGNOSIS — M542 Cervicalgia: Secondary | ICD-10-CM

## 2018-04-12 DIAGNOSIS — G8929 Other chronic pain: Secondary | ICD-10-CM

## 2018-04-12 DIAGNOSIS — M6281 Muscle weakness (generalized): Secondary | ICD-10-CM

## 2018-04-12 DIAGNOSIS — M25511 Pain in right shoulder: Principal | ICD-10-CM

## 2018-04-12 DIAGNOSIS — N920 Excessive and frequent menstruation with regular cycle: Secondary | ICD-10-CM | POA: Diagnosis not present

## 2018-04-14 ENCOUNTER — Encounter: Payer: BLUE CROSS/BLUE SHIELD | Admitting: Physical Therapy

## 2018-04-15 ENCOUNTER — Ambulatory Visit: Payer: BLUE CROSS/BLUE SHIELD | Admitting: Physical Therapy

## 2018-04-15 NOTE — Therapy (Addendum)
[facility] [address] [place], [place], [address] Phone: [phone]   Fax:  [phone]  Physical Therapy Treatment  Patient Details  Name: Lisa Walker MRN: [id number] Date of Birth: 03-04-1986 Referring Provider (PT): Dr. Cherre Blanc   Encounter Date: 04/08/2018  PT End of Session - 05/01/18 0841    Visit Number  3    Number of Visits  8    Date for PT Re-Evaluation  04/28/18    PT Start Time  1028    PT Stop Time  1125    PT Time Calculation (min)  57 min    Activity Tolerance  Patient tolerated treatment well;Patient limited by pain    Behavior During Therapy  [facility] for tasks assessed/performed       Past Medical History:  Diagnosis Date  . Anemia   . Medical history non-contributory   . PONV (postoperative nausea and vomiting)     Past Surgical History:  Procedure Laterality Date  . CESAREAN SECTION N/A 2011   performed in [place]  . CESAREAN SECTION N/A 09/03/2014   Procedure: repeat CESAREAN SECTION;  Surgeon: Aletha Halim, MD;  Location: [facility] ORS;  Service: Obstetrics;  Laterality: N/A;    There were no vitals filed for this visit.  Subjective Assessment - 05/01/18 0838    Subjective  Pt. reports minimal shoulder pain but neck is sore/ stiff.  Pt. states L LE has been sore (esp. in thigh).      Pertinent History  pt. known to PT clinic.  See past medical history.      Limitations  House hold activities;Lifting    Patient Stated Goals  Decrease neck/ shoulder pain.      Currently in Pain?  Yes    Pain Score  3     Pain Location  Neck    Pain Orientation  Lower;Right    Pain Descriptors / Indicators  Aching    Pain Type  Chronic pain           Treatment:  There.ex.:  Nautilus: 50# lat. Pull downs/ 30# sh. Adduction with handles/ 20# tricep ext./ 30# sh. Extension/ 40# scap. Retraction/ 30# chest press with wand 15x2 each. Supine 3#: serratus punches/ sh. Flexion/ horizontal abduction with light PT assist  20x. TG knee flexion with added sh. Flexion 30x. Wall push-ups 20x Scifit L6.5 10 min. B UE/LE  Manual tx.:  Supine R shoulder AAROM/ stretches (all planes of movement) STM to R UT/deltoid/ proximal biceps and triceps musculature.      PT Long Term Goals - 04/24/18 1750      PT LONG TERM GOAL #1   Title  Pt. independent with HEP to increase B UE muscle strength 1/2 muscle grade to improve pain-free mobility/ overhead reaching.      Baseline  L shoulder strength grossly 5/5 MMT except flexion 4+/5 MMT. R shoulder strength grossly 4/5 MMT, biceps/ triceps 4+/5 MMT. Grip strength: R 31#/L 52#.     Time  4    Period  Weeks    Status  New    Target Date  04/28/18      PT LONG TERM GOAL #2   Title  Pt. will increase FOTO to 73 to improve pain-free mobility with R UE.      Baseline  FOTO baseline 59/ goal 73.      Time  4    Period  Weeks    Status  New    Target Date  04/28/18      PT LONG TERM GOAL #3   Title  Pt. will report no tenderness with R shoulder/ deltoid/ UT palpation to improve pain-free mobility.      Baseline  (+) R sh. tenderness.     Time  4    Period  Weeks    Status  New    Target Date  04/28/18      PT LONG TERM GOAL #4   Title  Pt. will increase R grip strength to >40# to improve grasping/ UE functional mobility.      Baseline  Grip strength: R 31#/L 52#.     Time  4    Period  Weeks    Status  New    Target Date  04/28/18         Plan - 05/01/18 0842    Clinical Impression Statement  Pt. progressing well with strength training/ Nautilus ex.  Minimal c/o R shoulder pain with ROM but tenderness present during STM.  PT recommends consistent strength training and posture based ex.  No increase c/o pain after tx. session.  Good head position/ posture with supine and standing ther.ex.      Clinical Presentation  Stable    Clinical Decision Making  Low    Rehab Potential  Good    PT Frequency  2x / week    PT Treatment/Interventions  ADLs/Self Care Home  Management;Gait training;Stair training;Functional mobility training;Therapeutic activities;Therapeutic exercise;Balance training;Patient/family education;Neuromuscular re-education;Manual techniques;Passive range of motion;Energy conservation    PT Next Visit Plan  Progress R shoulder strengthening.      PT Home Exercise Plan  See HEP       Patient will benefit from skilled therapeutic intervention in order to improve the following deficits and impairments:  Abnormal gait, Improper body mechanics, Pain, Postural dysfunction, Decreased mobility, Decreased coordination, Decreased activity tolerance, Decreased endurance, Decreased range of motion, Decreased strength, Difficulty walking, Decreased safety awareness, Decreased balance, Impaired UE functional use  Visit Diagnosis: Chronic right shoulder pain  Muscle weakness (generalized)  Pain in neck     Problem List Patient Active Problem List   Diagnosis Date Noted  . Vitamin D deficiency 12/16/2015  . Difficulty walking 12/12/2015  . Weakness of lower extremity 12/12/2015  . Iron deficiency anemia due to chronic blood loss 12/04/2015  . Vitamin B 12 deficiency 12/04/2015  . Vitamin D insufficiency 12/04/2015  . Indication for care in labor or delivery 09/03/2014  . S/P cesarean section 09/03/2014  . Supervision of normal pregnancy 09/02/2014  . H/O cesarean section complicating pregnancy 36/14/4315   Pura Spice, PT, DPT # [phone] 05/01/2018, 8:46 AM  [facility] [address] [place], [place], [address] Phone: [phone]   Fax:  [phone]  Name: Lisa Walker MRN: [id number] Date of Birth: 09-30-1985

## 2018-04-15 NOTE — Therapy (Addendum)
[facility] [address] [place], [place], [address] Phone: [phone]   Fax:  [phone]  Physical Therapy Treatment  Patient Details  Name: Lisa Walker MRN: [id number] Date of Birth: October 22, 1985 Referring Provider (PT): Dr. Cherre Blanc   Encounter Date: 04/12/2018  PT End of Session - 05/01/18 1312    Visit Number  4    Number of Visits  8    Date for PT Re-Evaluation  04/28/18    PT Start Time  1502    PT Stop Time  1701    PT Time Calculation (min)  119 min    Activity Tolerance  Patient tolerated treatment well;Patient limited by pain    Behavior During Therapy  [facility] for tasks assessed/performed       Past Medical History:  Diagnosis Date  . Anemia   . Medical history non-contributory   . PONV (postoperative nausea and vomiting)     Past Surgical History:  Procedure Laterality Date  . CESAREAN SECTION N/A 2011   performed in [place]  . CESAREAN SECTION N/A 09/03/2014   Procedure: repeat CESAREAN SECTION;  Surgeon: Aletha Halim, MD;  Location: [facility] ORS;  Service: Obstetrics;  Laterality: N/A;    There were no vitals filed for this visit.  Subjective Assessment - 05/01/18 1311    Subjective  Pt. reports no new compliants.  Pt. states she has been compliant with HEP.  Pt. wearing soft cervical collar today.      Pertinent History  pt. known to PT clinic.  See past medical history.      Limitations  House hold activities;Lifting    Patient Stated Goals  Decrease neck/ shoulder pain.      Currently in Pain?  Yes    Pain Score  3     Pain Location  Neck    Pain Orientation  Lower    Pain Descriptors / Indicators  Aching    Pain Type  Chronic pain          Treatment:  There.ex.:  Nautilus: 50# lat. Pull downs/ 30# sh. Adduction with handles/ 20# tricep ext./ 30# sh. Extension/ 40# scap. Retraction/ 30# chest press with wand 15x3 each. Supine 3#: serratus punches/ sh. Flexion/ horizontal abduction with light PT  assist 30x. Issued RTB HEP (see handouts) Wall push-ups 20x Scifit L6.5 10 min. B UE/LE  Manual tx.: (no charge)  Supine STM to R UT/deltoid/ proximal biceps and triceps musculature.        PT Long Term Goals - 04/24/18 1750      PT LONG TERM GOAL #1   Title  Pt. independent with HEP to increase B UE muscle strength 1/2 muscle grade to improve pain-free mobility/ overhead reaching.      Baseline  L shoulder strength grossly 5/5 MMT except flexion 4+/5 MMT. R shoulder strength grossly 4/5 MMT, biceps/ triceps 4+/5 MMT. Grip strength: R 31#/L 52#.     Time  4    Period  Weeks    Status  New    Target Date  04/28/18      PT LONG TERM GOAL #2   Title  Pt. will increase FOTO to 73 to improve pain-free mobility with R UE.      Baseline  FOTO baseline 59/ goal 73.      Time  4    Period  Weeks    Status  New    Target Date  04/28/18      PT  LONG TERM GOAL #3   Title  Pt. will report no tenderness with R shoulder/ deltoid/ UT palpation to improve pain-free mobility.      Baseline  (+) R sh. tenderness.     Time  4    Period  Weeks    Status  New    Target Date  04/28/18      PT LONG TERM GOAL #4   Title  Pt. will increase R grip strength to >40# to improve grasping/ UE functional mobility.      Baseline  Grip strength: R 31#/L 52#.     Time  4    Period  Weeks    Status  New    Target Date  04/28/18            Plan - 05/01/18 1313    Clinical Impression Statement  Pt. presents with good cervical/ upper back posture with seated and standing ther.ex. on Nautilus.  Pt. motivated to increase strength and able to progress sets/reps during PT tx. session.  R deltoid/ proximal biceps tenderness remains with manual tx./ STM.      Clinical Presentation  Stable    Clinical Decision Making  Low    Rehab Potential  Good    PT Frequency  2x / week    PT Duration  4 weeks    PT Treatment/Interventions  ADLs/Self Care Home Management;Gait training;Stair training;Functional  mobility training;Therapeutic activities;Therapeutic exercise;Balance training;Patient/family education;Neuromuscular re-education;Manual techniques;Passive range of motion;Energy conservation    PT Next Visit Plan  Progress R shoulder strengthening.      PT Home Exercise Plan  See HEP       Patient will benefit from skilled therapeutic intervention in order to improve the following deficits and impairments:  Abnormal gait, Improper body mechanics, Pain, Postural dysfunction, Decreased mobility, Decreased coordination, Decreased activity tolerance, Decreased endurance, Decreased range of motion, Decreased strength, Difficulty walking, Decreased safety awareness, Decreased balance, Impaired UE functional use  Visit Diagnosis: Chronic right shoulder pain  Muscle weakness (generalized)  Pain in neck     Problem List Patient Active Problem List   Diagnosis Date Noted  . Vitamin D deficiency 12/16/2015  . Difficulty walking 12/12/2015  . Weakness of lower extremity 12/12/2015  . Iron deficiency anemia due to chronic blood loss 12/04/2015  . Vitamin B 12 deficiency 12/04/2015  . Vitamin D insufficiency 12/04/2015  . Indication for care in labor or delivery 09/03/2014  . S/P cesarean section 09/03/2014  . Supervision of normal pregnancy 09/02/2014  . H/O cesarean section complicating pregnancy 02/10/5101   Pura Spice, PT, DPT # [id number] 05/01/2018, 1:16 PM  [facility] [address] [place], [place], [address] Phone: [phone]   Fax:  [phone]  Name: Lisa Walker MRN: [id number] Date of Birth: 1985-10-01

## 2018-04-15 NOTE — Therapy (Addendum)
[place] [facility] [address] [place], [place], [address] Phone: [phone]   Fax:  [phone]  Physical Therapy Treatment  Patient Details  Name: Lisa Walker MRN: [id number] Date of Birth: Jul 05, 1985 Referring Provider (PT): Dr. Cherre Blanc   Encounter Date: 04/05/2018  PT End of Session - 04/24/18 1916    Visit Number  2    Number of Visits  8    Date for PT Re-Evaluation  04/28/18    PT Start Time  1606    PT Stop Time  1701    PT Time Calculation (min)  55 min    Activity Tolerance  Patient tolerated treatment well;Patient limited by pain    Behavior During Therapy  [facility] for tasks assessed/performed       Past Medical History:  Diagnosis Date  . Anemia   . Medical history non-contributory   . PONV (postoperative nausea and vomiting)     Past Surgical History:  Procedure Laterality Date  . CESAREAN SECTION N/A 2011   performed in [place]  . CESAREAN SECTION N/A 09/03/2014   Procedure: repeat CESAREAN SECTION;  Surgeon: Aletha Halim, MD;  Location: [facility] ORS;  Service: Obstetrics;  Laterality: N/A;    There were no vitals filed for this visit.  Subjective Assessment - 04/24/18 1910    Subjective  Pt. states when she got out of bed this morning it took about 15 steps for her to stand up right/ walk normal.  Pt. reports 4/10 neck pain at this time.      Pertinent History  pt. known to PT clinic.  See past medical history.      Limitations  House hold activities;Lifting    Patient Stated Goals  Decrease neck/ shoulder pain.      Currently in Pain?  Yes    Pain Score  4     Pain Location  Neck    Pain Orientation  Right;Lower    Pain Descriptors / Indicators  Aching        There.ex.:  Scifit L6 10 min. B UE/LE (warm-up) Reviewed HEP Supine cervical/ shoulder stretches (all planes) Nautilus: 50# lat. Pull downs/ 30# tricep extension/ 30# sh. Ext./ 40# scap. Retraction/ 30# chest press with bar 20x each.   Good upright  posture.   PT Long Term Goals - 04/24/18 1750      PT LONG TERM GOAL #1   Title  Pt. independent with HEP to increase B UE muscle strength 1/2 muscle grade to improve pain-free mobility/ overhead reaching.      Baseline  L shoulder strength grossly 5/5 MMT except flexion 4+/5 MMT. R shoulder strength grossly 4/5 MMT, biceps/ triceps 4+/5 MMT. Grip strength: R 31#/L 52#.     Time  4    Period  Weeks    Status  New    Target Date  04/28/18      PT LONG TERM GOAL #2   Title  Pt. will increase FOTO to 73 to improve pain-free mobility with R UE.      Baseline  FOTO baseline 59/ goal 73.      Time  4    Period  Weeks    Status  New    Target Date  04/28/18      PT LONG TERM GOAL #3   Title  Pt. will report no tenderness with R shoulder/ deltoid/ UT palpation to improve pain-free mobility.      Baseline  (+) R sh. tenderness.  Time  4    Period  Weeks    Status  New    Target Date  04/28/18      PT LONG TERM GOAL #4   Title  Pt. will increase R grip strength to >40# to improve grasping/ UE functional mobility.      Baseline  Grip strength: R 31#/L 52#.     Time  4    Period  Weeks    Status  New    Target Date  04/28/18            Plan - 04/24/18 1916    Clinical Impression Statement  Pt. guarded with R shoulder stretches today and reports cervical tightness with L lat. flexion/ rotn.  Good posture/ technique with resisted ther.ex. program and Nautilus.  No changes to HEP at this time.  PT encouraged pt. to remain compliant with HEP and use bike/ cardio equipment at gym.      Clinical Presentation  Stable    Clinical Decision Making  Low    Rehab Potential  Good    PT Frequency  2x / week    PT Duration  4 weeks    PT Treatment/Interventions  ADLs/Self Care Home Management;Gait training;Stair training;Functional mobility training;Therapeutic activities;Therapeutic exercise;Balance training;Patient/family education;Neuromuscular re-education;Manual techniques;Passive  range of motion;Energy conservation    PT Next Visit Plan  Progress R shoulder strengthening.      PT Home Exercise Plan  See HEP       Patient will benefit from skilled therapeutic intervention in order to improve the following deficits and impairments:  Abnormal gait, Improper body mechanics, Pain, Postural dysfunction, Decreased mobility, Decreased coordination, Decreased activity tolerance, Decreased endurance, Decreased range of motion, Decreased strength, Difficulty walking, Decreased safety awareness, Decreased balance, Impaired UE functional use  Visit Diagnosis: Chronic right shoulder pain  Muscle weakness (generalized)  Pain in neck     Problem List Patient Active Problem List   Diagnosis Date Noted  . Vitamin D deficiency 12/16/2015  . Difficulty walking 12/12/2015  . Weakness of lower extremity 12/12/2015  . Iron deficiency anemia due to chronic blood loss 12/04/2015  . Vitamin B 12 deficiency 12/04/2015  . Vitamin D insufficiency 12/04/2015  . Indication for care in labor or delivery 09/03/2014  . S/P cesarean section 09/03/2014  . Supervision of normal pregnancy 09/02/2014  . H/O cesarean section complicating pregnancy 62/95/2841   Pura Spice, PT, DPT # [id number] 04/24/2018, 7:21 PM  [facility] [address] [place], [place], [address] Phone: [phone]   Fax:  [phone]  Name: Lisa Walker MRN: [id number] Date of Birth: [birth year]

## 2018-04-21 ENCOUNTER — Ambulatory Visit: Payer: BLUE CROSS/BLUE SHIELD | Admitting: Physical Therapy

## 2018-04-24 NOTE — Addendum Note (Signed)
Addended by: Dorcas Carrow C on: 04/24/2018 06:00 PM   Modules accepted: Orders

## 2018-04-25 ENCOUNTER — Encounter: Payer: Self-pay | Admitting: Emergency Medicine

## 2018-04-25 ENCOUNTER — Ambulatory Visit
Admission: EM | Admit: 2018-04-25 | Discharge: 2018-04-25 | Disposition: A | Discharge status: Home / Self Care | Payer: BLUE CROSS/BLUE SHIELD | Attending: Family Medicine | Admitting: Family Medicine

## 2018-04-25 ENCOUNTER — Other Ambulatory Visit: Payer: Self-pay

## 2018-04-25 DIAGNOSIS — J0140 Acute pansinusitis, unspecified: Secondary | ICD-10-CM | POA: Insufficient documentation

## 2018-04-25 DIAGNOSIS — R05 Cough: Secondary | ICD-10-CM | POA: Diagnosis not present

## 2018-04-25 DIAGNOSIS — R059 Cough, unspecified: Secondary | ICD-10-CM

## 2018-04-25 MED ORDER — BENZONATATE 100 MG PO CAPS: 100.0000 mg | ORAL_CAPSULE | Freq: Three times a day (TID) | ORAL | 0 refills | Status: DC | PRN

## 2018-04-25 MED ORDER — AMOXICILLIN-POT CLAVULANATE 875-125 MG PO TABS: 1.0000 | ORAL_TABLET | Freq: Two times a day (BID) | ORAL | 0 refills | Status: AC

## 2018-04-25 NOTE — ED Provider Notes (Signed)
[facility]    CSN: [id number] Arrival date & time: 04/25/18  1132     History   Chief Complaint Chief Complaint  Patient presents with  . Cough    appt    HPI Lisa Walker is a 32 y.o. female.   32 year old female presents with nasal congestion, headache, sinus pressure and cough for over 2 weeks. Also having some nausea and diarrhea last week but has resolved. Denies any distinct fever or vomiting. Left eye was swollen this morning. Other family members have been sick with similar symptoms but have recovered. Has been taking OTC generic Zyrtec with minimal relief. Only chronic health issue is anemia and takes Iron supplements daily.   The history is provided by the patient.    Past Medical History:  Diagnosis Date  . Anemia   . Medical history non-contributory   . PONV (postoperative nausea and vomiting)     Patient Active Problem List   Diagnosis Date Noted  . Vitamin D deficiency 12/16/2015  . Difficulty walking 12/12/2015  . Weakness of lower extremity 12/12/2015  . Iron deficiency anemia due to chronic blood loss 12/04/2015  . Vitamin B 12 deficiency 12/04/2015  . Vitamin D insufficiency 12/04/2015  . Indication for care in labor or delivery 09/03/2014  . S/P cesarean section 09/03/2014  . Supervision of normal pregnancy 09/02/2014  . H/O cesarean section complicating pregnancy 42/35/3614    Past Surgical History:  Procedure Laterality Date  . CESAREAN SECTION N/A 2011   performed in [place]  . CESAREAN SECTION N/A 09/03/2014   Procedure: repeat CESAREAN SECTION;  Surgeon: Aletha Halim, MD;  Location: [facility] ORS;  Service: Obstetrics;  Laterality: N/A;    OB History    Gravida  3   Para  2   Term  1   Preterm      AB  1   Living  2     SAB  1   TAB      Ectopic      Multiple  0   Live Births  2            Home Medications    Prior to Admission medications   Medication Sig Start Date End Date Taking? Authorizing  Provider  ferrous sulfate 325 (65 FE) MG EC tablet Take 1 tablet (325 mg total) by mouth 3 (three) times daily with meals. 03/31/18  Yes Earlie Server, MD  amoxicillin-clavulanate (AUGMENTIN) 875-125 MG tablet Take 1 tablet by mouth every 12 (twelve) hours for 7 days. 04/25/18 05/02/18  Katy Apo, NP  benzonatate (TESSALON) 100 MG capsule Take 1 capsule (100 mg total) by mouth 3 (three) times daily as needed for cough. 04/25/18   Katy Apo, NP    Family History Family History  Problem Relation Age of Onset  . Diabetes Father   . Diabetes Mother     Social History Social History   Tobacco Use  . Smoking status: Never Smoker  . Smokeless tobacco: Never Used  Substance Use Topics  . Alcohol use: No    Alcohol/week: 0.0 standard drinks  . Drug use: No     Allergies   Contrast media [iodinated diagnostic agents]; Multihance [gadobenate]; and Pineapple   Review of Systems Review of Systems  Constitutional: Positive for fatigue. Negative for activity change, appetite change, chills and fever.  HENT: Positive for congestion, sinus pressure and sinus pain. Negative for ear discharge, ear pain, facial swelling, mouth sores, nosebleeds, postnasal  drip, rhinorrhea, sneezing, sore throat and trouble swallowing.   Eyes: Positive for redness (left eye). Negative for photophobia, pain, discharge, itching and visual disturbance.  Respiratory: Positive for cough. Negative for chest tightness, shortness of breath, wheezing and stridor.   Gastrointestinal: Positive for diarrhea and nausea. Negative for abdominal pain and vomiting.  Musculoskeletal: Negative for arthralgias, myalgias, neck pain and neck stiffness.  Skin: Negative for color change, rash and wound.  Neurological: Positive for headaches. Negative for dizziness, tremors, seizures, syncope, weakness, light-headedness and numbness.  Hematological: Negative for adenopathy. Does not bruise/bleed easily.     Physical Exam Triage  Vital Signs ED Triage Vitals  Enc Vitals Group     BP 04/25/18 1204 99/71     Pulse Rate 04/25/18 1204 73     Resp 04/25/18 1204 16     Temp 04/25/18 1204 98.1 F (36.7 C)     Temp Source 04/25/18 1204 Oral     SpO2 04/25/18 1204 100 %     Weight 04/25/18 1202 146 lb (66.2 kg)     Height 04/25/18 1202 5\' 4"  (1.626 m)     Head Circumference --      Peak Flow --      Pain Score 04/25/18 1201 0     Pain Loc --      Pain Edu? --      Excl. in [place]? --    No data found.  Updated Vital Signs BP 99/71 (BP Location: Left Arm)   Pulse 73   Temp 98.1 F (36.7 C) (Oral)   Resp 16   Ht 5\' 4"  (1.626 m)   Wt 146 lb (66.2 kg)   SpO2 100%   BMI 25.06 kg/m   Visual Acuity Right Eye Distance:   Left Eye Distance:   Bilateral Distance:    Right Eye Near:   Left Eye Near:    Bilateral Near:     Physical Exam Vitals signs and nursing note reviewed.  Constitutional:      General: She is not in acute distress.    Appearance: Normal appearance. She is well-developed, well-groomed and normal weight. She is ill-appearing.     Comments: Patient sitting in exam chair in no acute distress but appears ill.   HENT:     Head: Normocephalic and atraumatic.     Right Ear: Hearing, ear canal and external ear normal. Tympanic membrane is bulging. Tympanic membrane is not injected or erythematous.     Left Ear: Hearing, ear canal and external ear normal. Tympanic membrane is bulging. Tympanic membrane is not injected or erythematous.     Nose: Congestion present. No rhinorrhea.     Right Turbinates: Swollen.     Left Turbinates: Swollen.     Right Sinus: Maxillary sinus tenderness and frontal sinus tenderness present.     Left Sinus: Maxillary sinus tenderness and frontal sinus tenderness present.     Mouth/Throat:     Lips: Pink.     Mouth: Mucous membranes are moist. No oral lesions.     Pharynx: Oropharynx is clear. Uvula midline. No pharyngeal swelling or posterior oropharyngeal erythema.    Eyes:     Extraocular Movements: Extraocular movements intact.     Conjunctiva/sclera: Conjunctivae normal.  Neck:     Musculoskeletal: Normal range of motion and neck supple. No muscular tenderness.  Cardiovascular:     Rate and Rhythm: Normal rate and regular rhythm.     Heart sounds: Normal heart sounds. No murmur.  Pulmonary:  Effort: Pulmonary effort is normal. No accessory muscle usage or respiratory distress.     Breath sounds: Normal air entry. No decreased air movement. Examination of the right-upper field reveals rhonchi. Examination of the left-upper field reveals rhonchi. Rhonchi (with coughing) present. No decreased breath sounds, wheezing or rales.  Lymphadenopathy:     Cervical: No cervical adenopathy.  Skin:    General: Skin is warm and dry.     Capillary Refill: Capillary refill takes less than 2 seconds.     Findings: No rash.  Neurological:     General: No focal deficit present.     Mental Status: She is alert and oriented to person, place, and time.  Psychiatric:        Mood and Affect: Mood normal.        Behavior: Behavior normal. Behavior is cooperative.      UC Treatments / Results  Labs (all labs ordered are listed, but only abnormal results are displayed) Labs Reviewed - No data to display  EKG None  Radiology No results found.  Procedures Procedures (including critical care time)  Medications Ordered in UC Medications - No data to display  Initial Impression / Assessment and Plan / UC Course  I have reviewed the triage vital signs and the nursing notes.  Pertinent labs & imaging results that were available during my care of the patient were reviewed by me and considered in my medical decision making (see chart for details).    Reviewed with patient that she probably has a sinus infection. Will start Augmentin 875mg  twice a day as directed. May take Tessalon cough pills as directed. Continue to increase fluids to help loosen up mucus.  May continue Zyrtec daily as needed. Follow-up in 4 to 5 days if not improving.  Final Clinical Impressions(s) / UC Diagnoses   Final diagnoses:  Acute non-recurrent pansinusitis  Cough     Discharge Instructions     Recommend start Augmentin (antibiotic) 875mg  twice a day as directed. May take Tessalon cough pills every 8 hours as needed. Increase fluids to help loosen up mucus. Follow-up in 4 to 5 days if not improving.     ED Prescriptions    Medication Sig Dispense Auth. Provider   amoxicillin-clavulanate (AUGMENTIN) 875-125 MG tablet Take 1 tablet by mouth every 12 (twelve) hours for 7 days. 14 tablet Briggs Edelen, Nicholes Stairs, NP   benzonatate (TESSALON) 100 MG capsule Take 1 capsule (100 mg total) by mouth 3 (three) times daily as needed for cough. 21 capsule Katy Apo, NP     Controlled Substance Prescriptions [place] Controlled Substance Registry consulted? Not Applicable   Katy Apo, NP 04/25/18 2308

## 2018-04-25 NOTE — Discharge Instructions (Addendum)
Recommend start Augmentin (antibiotic) 875mg  twice a day as directed. May take Tessalon cough pills every 8 hours as needed. Increase fluids to help loosen up mucus. Follow-up in 4 to 5 days if not improving.

## 2018-04-25 NOTE — ED Triage Notes (Signed)
Pt c/o cough, nasal and chest congestion, headache, sinus pain/pressure, left eye swelling. Started about 2 weeks ago.

## 2018-04-26 ENCOUNTER — Encounter: Payer: Self-pay | Admitting: Physical Therapy

## 2018-04-26 ENCOUNTER — Ambulatory Visit: Payer: BLUE CROSS/BLUE SHIELD | Admitting: Physical Therapy

## 2018-04-26 DIAGNOSIS — G8929 Other chronic pain: Secondary | ICD-10-CM

## 2018-04-26 DIAGNOSIS — M542 Cervicalgia: Secondary | ICD-10-CM

## 2018-04-26 DIAGNOSIS — M25511 Pain in right shoulder: Principal | ICD-10-CM

## 2018-04-26 DIAGNOSIS — M6281 Muscle weakness (generalized): Secondary | ICD-10-CM

## 2018-04-26 DIAGNOSIS — N920 Excessive and frequent menstruation with regular cycle: Secondary | ICD-10-CM | POA: Diagnosis not present

## 2018-04-26 NOTE — Therapy (Addendum)
[facility] [address] [place], [place], [address] Phone: [phone]   Fax:  [phone]  Physical Therapy Treatment  Patient Details  Name: Lisa Walker MRN: [id number] Date of Birth: 1985-11-18 Referring Provider (PT): Dr. Cherre Blanc   Encounter Date: 04/26/2018    Treatment: 5 of 8.  Recert date: 2/0/9470 9628 to 1749   Past Medical History:  Diagnosis Date  . Anemia   . Medical history non-contributory   . PONV (postoperative nausea and vomiting)     Past Surgical History:  Procedure Laterality Date  . CESAREAN SECTION N/A 2011   performed in [place]  . CESAREAN SECTION N/A 09/03/2014   Procedure: repeat CESAREAN SECTION;  Surgeon: Aletha Halim, MD;  Location: [facility] ORS;  Service: Obstetrics;  Laterality: N/A;    There were no vitals filed for this visit.      Pt. states she was sick over past week and has sinus infection. Pt. is taking antibiotics. Pt. has no questions about HEP.             There.ex.:  Nautilus: 50# lat. Pull downs/ 30# sh. Adduction with handles/ 20# tricep ext./ 30# sh. Extension/ 40# scap. Retraction/ 30# chest press with wand 15x3 each. Supine 3#: serratus punches/ sh. Flexion/ horizontal abduction with light PT assist 30x. Reviewed RTB HEP/ gym based ex.  Wall push-ups 20x Scifit L6.5 10 min. B UE/LE  Manual tx.:  Supine STM to R UT/deltoid/ proximal biceps and triceps musculature. Supine UT/levator stretches 3x20 sec.      PT Long Term Goals - 05/11/18 1123      PT LONG TERM GOAL #1   Title  Pt. independent with HEP to increase B UE muscle strength 1/2 muscle grade to improve pain-free mobility/ overhead reaching.      Baseline  Goal met    Time  4    Period  Weeks    Status  Achieved    Target Date  04/26/18      PT LONG TERM GOAL #2   Title  Pt. will increase FOTO to 73 to improve pain-free mobility with R UE.      Baseline  FOTO baseline 59/ goal 73.      Time   4    Period  Weeks    Status  Unable to assess    Target Date  04/26/18      PT LONG TERM GOAL #3   Title  Pt. will report no tenderness with R shoulder/ deltoid/ UT palpation to improve pain-free mobility.      Baseline  improvement noted    Time  4    Period  Weeks    Status  Partially Met    Target Date  04/26/18      PT LONG TERM GOAL #4   Title  Pt. will increase R grip strength to >40# to improve grasping/ UE functional mobility.      Baseline  Grip strength: R 41#/L 54#.     Time  4    Period  Weeks    Status  Achieved    Target Date  05/11/18        Pt. is independent with current progressive HEP and has gym based ex. to complete at local gym. Pt. instructed to contact PT if any questions or concerns over next several weeks/ months. Discharge from PT at this time.        Patient will benefit from skilled therapeutic intervention  in order to improve the following deficits and impairments:  Abnormal gait, Improper body mechanics, Pain, Postural dysfunction, Decreased mobility, Decreased coordination, Decreased activity tolerance, Decreased endurance, Decreased range of motion, Decreased strength, Difficulty walking, Decreased safety awareness, Decreased balance, Impaired UE functional use  Visit Diagnosis: Chronic right shoulder pain  Muscle weakness (generalized)  Pain in neck     Problem List Patient Active Problem List   Diagnosis Date Noted  . Vitamin D deficiency 12/16/2015  . Difficulty walking 12/12/2015  . Weakness of lower extremity 12/12/2015  . Iron deficiency anemia due to chronic blood loss 12/04/2015  . Vitamin B 12 deficiency 12/04/2015  . Vitamin D insufficiency 12/04/2015  . Indication for care in labor or delivery 09/03/2014  . S/P cesarean section 09/03/2014  . Supervision of normal pregnancy 09/02/2014  . H/O cesarean section complicating pregnancy 89/38/1017   Pura Spice, PT, DPT # [id number] 05/11/2018, 11:26 AM  [facility] [address] [place], [place], [address] Phone: [phone]   Fax:  [phone]  Name: Lisa Walker MRN: [id number] Date of Birth: Nov 15, 1985

## 2018-04-29 ENCOUNTER — Encounter: Payer: BLUE CROSS/BLUE SHIELD | Admitting: Physical Therapy

## 2018-06-24 ENCOUNTER — Inpatient Hospital Stay: Payer: BLUE CROSS/BLUE SHIELD | Attending: Oncology

## 2018-07-01 ENCOUNTER — Inpatient Hospital Stay: Payer: BLUE CROSS/BLUE SHIELD | Admitting: Oncology

## 2018-07-18 ENCOUNTER — Other Ambulatory Visit: Payer: Self-pay

## 2018-07-18 DIAGNOSIS — D500 Iron deficiency anemia secondary to blood loss (chronic): Secondary | ICD-10-CM

## 2018-07-20 ENCOUNTER — Other Ambulatory Visit: Payer: BLUE CROSS/BLUE SHIELD

## 2018-07-21 ENCOUNTER — Other Ambulatory Visit: Payer: Self-pay

## 2018-07-21 ENCOUNTER — Telehealth: Payer: Self-pay

## 2018-07-21 NOTE — Telephone Encounter (Signed)
Ok to postpone 6 weeks. And also please re schedule her MD appt. Thanks.

## 2018-07-21 NOTE — Telephone Encounter (Signed)
Patient would like to reschedule her lab appointment. Please call patient to reschedule. Than you.

## 2018-07-22 ENCOUNTER — Ambulatory Visit: Payer: BLUE CROSS/BLUE SHIELD | Admitting: Oncology

## 2018-07-22 ENCOUNTER — Inpatient Hospital Stay: Payer: Commercial Managed Care - PPO

## 2018-07-25 ENCOUNTER — Inpatient Hospital Stay: Payer: Commercial Managed Care - PPO | Admitting: Oncology

## 2018-08-16 ENCOUNTER — Other Ambulatory Visit: Payer: Self-pay

## 2018-08-17 ENCOUNTER — Inpatient Hospital Stay: Payer: Commercial Managed Care - PPO | Attending: Oncology

## 2018-08-17 ENCOUNTER — Other Ambulatory Visit: Payer: Self-pay

## 2018-08-17 DIAGNOSIS — D500 Iron deficiency anemia secondary to blood loss (chronic): Secondary | ICD-10-CM | POA: Diagnosis present

## 2018-08-17 LAB — CBC WITH DIFFERENTIAL/PLATELET
Abs Immature Granulocytes: 0.01 10*3/uL (ref 0.00–0.07)
Basophils Absolute: 0 10*3/uL (ref 0.0–0.1)
Basophils Relative: 1 %
Eosinophils Absolute: 0.1 10*3/uL (ref 0.0–0.5)
Eosinophils Relative: 2 %
HCT: 36.4 % (ref 36.0–46.0)
Hemoglobin: 12 g/dL (ref 12.0–15.0)
Immature Granulocytes: 0 %
Lymphocytes Relative: 37 %
Lymphs Abs: 2.1 10*3/uL (ref 0.7–4.0)
MCH: 27.1 pg (ref 26.0–34.0)
MCHC: 33 g/dL (ref 30.0–36.0)
MCV: 82.2 fL (ref 80.0–100.0)
Monocytes Absolute: 0.5 10*3/uL (ref 0.1–1.0)
Monocytes Relative: 10 %
Neutro Abs: 2.8 10*3/uL (ref 1.7–7.7)
Neutrophils Relative %: 50 %
Platelets: 265 10*3/uL (ref 150–400)
RBC: 4.43 MIL/uL (ref 3.87–5.11)
RDW: 12.3 % (ref 11.5–15.5)
WBC: 5.6 10*3/uL (ref 4.0–10.5)
nRBC: 0 % (ref 0.0–0.2)

## 2018-08-17 LAB — IRON AND TIBC
Iron: 113 ug/dL (ref 28–170)
Saturation Ratios: 34 % — ABNORMAL HIGH (ref 10.4–31.8)
TIBC: 328 ug/dL (ref 250–450)
UIBC: 215 ug/dL

## 2018-08-17 LAB — FERRITIN: Ferritin: 28 ng/mL (ref 11–307)

## 2018-08-19 ENCOUNTER — Inpatient Hospital Stay: Payer: Commercial Managed Care - PPO | Admitting: Oncology

## 2018-08-26 ENCOUNTER — Inpatient Hospital Stay: Payer: Commercial Managed Care - PPO | Admitting: Oncology

## 2018-08-26 ENCOUNTER — Other Ambulatory Visit: Payer: Self-pay

## 2018-08-31 DIAGNOSIS — N926 Irregular menstruation, unspecified: Secondary | ICD-10-CM | POA: Diagnosis not present

## 2018-08-31 DIAGNOSIS — D649 Anemia, unspecified: Secondary | ICD-10-CM | POA: Diagnosis not present

## 2018-08-31 DIAGNOSIS — R21 Rash and other nonspecific skin eruption: Secondary | ICD-10-CM | POA: Diagnosis not present

## 2018-09-04 DIAGNOSIS — L308 Other specified dermatitis: Secondary | ICD-10-CM | POA: Diagnosis not present

## 2018-09-07 ENCOUNTER — Other Ambulatory Visit: Payer: Self-pay

## 2018-09-08 ENCOUNTER — Inpatient Hospital Stay: Payer: Commercial Managed Care - PPO | Attending: Oncology | Admitting: Oncology

## 2018-09-08 ENCOUNTER — Encounter: Payer: Self-pay | Admitting: Oncology

## 2018-09-08 DIAGNOSIS — D509 Iron deficiency anemia, unspecified: Secondary | ICD-10-CM

## 2018-09-08 DIAGNOSIS — L239 Allergic contact dermatitis, unspecified cause: Secondary | ICD-10-CM

## 2018-09-08 DIAGNOSIS — D500 Iron deficiency anemia secondary to blood loss (chronic): Secondary | ICD-10-CM

## 2018-09-08 DIAGNOSIS — E559 Vitamin D deficiency, unspecified: Secondary | ICD-10-CM | POA: Diagnosis not present

## 2018-09-08 DIAGNOSIS — R202 Paresthesia of skin: Secondary | ICD-10-CM

## 2018-09-08 DIAGNOSIS — Z79899 Other long term (current) drug therapy: Secondary | ICD-10-CM

## 2018-09-08 DIAGNOSIS — R200 Anesthesia of skin: Secondary | ICD-10-CM | POA: Diagnosis not present

## 2018-09-08 MED ORDER — VITAMIN B-6 50 MG PO TABS: 50.0000 mg | ORAL_TABLET | Freq: Every day | ORAL | 0 refills | Status: DC

## 2018-09-08 MED ORDER — VITAMIN D 25 MCG (1000 UNIT) PO TABS: 1000.0000 [IU] | ORAL_TABLET | Freq: Every day | ORAL | 0 refills | Status: DC

## 2018-09-08 NOTE — Progress Notes (Signed)
HEMATOLOGY-ONCOLOGY TeleHEALTH VISIT PROGRESS NOTE  I connected with Lisa Walker on 09/08/18 at 10:30 AM EDT by video enabled telemedicine visit and verified that I am speaking with the correct person using two identifiers. I discussed the limitations, risks, security and privacy concerns of performing an evaluation and management service by telemedicine and the availability of in-person appointments. I also discussed with the patient that there may be a patient responsible charge related to this service. The patient expressed understanding and agreed to proceed.   Other persons participating in the visit and their role in the encounter:  Janeann Merl, RN, check in patient.   Patient's location: Home  Provider's location: Home office Chief Complaint: Follow-up for management of iron deficiency anemia. Evaluation of rash, and numbness   INTERVAL HISTORY Lisa Walker is a 33 y.o. female who has above history reviewed by me today presents for follow up visit for management of iron deficiency anemia. Problems and complaints are listed below:  Patient reports feeling okay.    # Rash, this is a new problem for her.  She has had developed rash on her arm, hands anterior chest wall which are itchy. She was recently seen by primary care provider Dr. Ginette Pitman and was diagnosed contact dermatitis.  Patient was prescribed with hydrocortisone cream 2.5% apply twice a day on affected area for 10 days.  She feels her rash and itchiness have not significant improved and request some medication for itchiness.  Patient also complains bilateral hand and feet numbness.  Reports this is a new problem for her no exacerbating or alleviating factors.  Energy is good.  Appetite is fair.  Denies any fever, chills, nausea vomiting.  Chronic unsteadiness has improved. Iron deficiency anemia, patient has been taking oral iron supplementation.  Tolerates well.  Review of Systems  Constitutional: Negative for appetite  change, chills, fatigue and fever.  HENT:   Negative for hearing loss and voice change.   Eyes: Negative for eye problems.  Respiratory: Negative for chest tightness and cough.   Cardiovascular: Negative for chest pain.  Gastrointestinal: Negative for abdominal distention, abdominal pain and blood in stool.  Endocrine: Negative for hot flashes.  Genitourinary: Negative for difficulty urinating and frequency.   Musculoskeletal: Negative for arthralgias.  Skin: Positive for itching and rash.  Neurological: Negative for extremity weakness.       Numbness and tingling  Hematological: Negative for adenopathy.  Psychiatric/Behavioral: Negative for confusion.    Past Medical History:  Diagnosis Date  . Anemia   . Medical history non-contributory   . PONV (postoperative nausea and vomiting)    Past Surgical History:  Procedure Laterality Date  . CESAREAN SECTION N/A 2011   performed in [place]  . CESAREAN SECTION N/A 09/03/2014   Procedure: repeat CESAREAN SECTION;  Surgeon: Aletha Halim, MD;  Location: [facility] ORS;  Service: Obstetrics;  Laterality: N/A;    Family History  Problem Relation Age of Onset  . Diabetes Father   . Diabetes Mother     Social History   Socioeconomic History  . Marital status: Married    Spouse name: Not on file  . Number of children: Not on file  . Years of education: Not on file  . Highest education level: Not on file  Occupational History  . Not on file  Social Needs  . Financial resource strain: Not on file  . Food insecurity:    Worry: Not on file    Inability: Not on file  . Transportation needs:  Medical: Not on file    Non-medical: Not on file  Tobacco Use  . Smoking status: Never Smoker  . Smokeless tobacco: Never Used  Substance and Sexual Activity  . Alcohol use: No    Alcohol/week: 0.0 standard drinks  . Drug use: No  . Sexual activity: Yes    Birth control/protection: None  Lifestyle  . Physical activity:    Days per week: Not  on file    Minutes per session: Not on file  . Stress: Not on file  Relationships  . Social connections:    Talks on phone: Not on file    Gets together: Not on file    Attends religious service: Not on file    Active member of club or organization: Not on file    Attends meetings of clubs or organizations: Not on file    Relationship status: Not on file  . Intimate partner violence:    Fear of current or ex partner: Not on file    Emotionally abused: Not on file    Physically abused: Not on file    Forced sexual activity: Not on file  Other Topics Concern  . Not on file  Social History Narrative  . Not on file    Current Outpatient Medications on File Prior to Visit  Medication Sig Dispense Refill  . benzonatate (TESSALON) 100 MG capsule Take 1 capsule (100 mg total) by mouth 3 (three) times daily as needed for cough. (Patient not taking: Reported on 09/08/2018) 21 capsule 0   No current facility-administered medications on file prior to visit.     Allergies  Allergen Reactions  . Contrast Media [Iodinated Diagnostic Agents] Shortness Of Breath  . Multihance [Gadobenate] Shortness Of Breath and Cough  . Pineapple        Observations/Objective: There were no vitals filed for this visit. There is no height or weight on file to calculate BMI.  Physical Exam  Constitutional: She is oriented to person, place, and time. No distress.  HENT:  Head: Normocephalic and atraumatic.  Pulmonary/Chest: Effort normal.  Neurological: She is alert and oriented to person, place, and time.  Skin: Rash noted.  Psychiatric: Affect normal.   I have personally reviewed below laboratory results. CBC    Component Value Date/Time   WBC 5.6 08/17/2018 1122   RBC 4.43 08/17/2018 1122   HGB 12.0 08/17/2018 1122   HCT 36.4 08/17/2018 1122   PLT 265 08/17/2018 1122   MCV 82.2 08/17/2018 1122   MCH 27.1 08/17/2018 1122   MCHC 33.0 08/17/2018 1122   RDW 12.3 08/17/2018 1122   LYMPHSABS 2.1  08/17/2018 1122   MONOABS 0.5 08/17/2018 1122   EOSABS 0.1 08/17/2018 1122   BASOSABS 0.0 08/17/2018 1122    CMP     Component Value Date/Time   NA 138 03/31/2018 0854   K 4.1 03/31/2018 0854   CL 104 03/31/2018 0854   CO2 26 03/31/2018 0854   GLUCOSE 105 (H) 03/31/2018 0854   BUN 15 03/31/2018 0854   CREATININE 0.63 03/31/2018 0854   CALCIUM 8.9 03/31/2018 0854   PROT 7.8 03/31/2018 0854   ALBUMIN 4.2 03/31/2018 0854   AST 19 03/31/2018 0854   ALT 18 03/31/2018 0854   ALKPHOS 67 03/31/2018 0854   BILITOT 0.7 03/31/2018 0854   GFRNONAA >60 03/31/2018 0854   GFRAA >60 03/31/2018 0854    RADIOGRAPHIC STUDIES: I have personally reviewed the radiological images as listed and agreed with the findings in the  report. 07/30/2017 MRI Cervical Spine w contrast 1. Satisfactory postoperative appearance of the cervical spine. Cervical spinal cord and subarachnoid space are within normal limits. 2. Nonspecific decreased marrow signal in the visible spine. Query anemia.  07/23/2016 MR cervical Spine wo contrast IMPRESSION: 1. No visible residual cyst. Mild flattening of the left hemicord at C3, significantly improved. 2. Fluid in the C2-C4 laminectomy defect is capsulated but has no concerning mass-effect  Assessment and Plan: 1. Iron deficiency anemia due to chronic blood loss   2. Vitamin D deficiency   3. Numbness and tingling   4. Allergic contact dermatitis, unspecified trigger     Labs are reviewed and discussed with patient. Hemoglobin 12, iron saturation 34, ferritin 28.  Discussed with patient that her iron panel indicates adequate iron store.  She can stop taking oral iron supplementation for the time being.  Contact dermatitis, questionable allergic , poison ivy ? patient wants me to take a look at her rash.  Reports still having itchiness despite using hydrocortisone cream. Advised patient to try over-the-counter Benadryl cream if still not working, she may take oral  Benadryl 50 mg every 8 hours as needed.  Possible side effects include drowsiness.  Avoid driving after taking Benadryl.  Numbness and tingling, this is a new problem for her etiology unknown.  She is not diabetic.  Currently she denies pain. 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 .  Patient has previously followed up with Dr. Manuella Ghazi and I recommend patient to making a follow-up appointment with him. For the time being, I recommend patient to start taking vitamin B6 supplements.  Prescription sent to pharmacy..  She understands that taking vitamin may not help her numbness tingling.  She needs further work-up for potential neuropathy.  Patient has a history of vitamin D deficiency.  Patient request prescription for vitamin D prescription.  Prescription of vitamin D 1000 unit daily sent to pharmacy.check her vitamin D level.  We will check it at next visit.  Follow Up Instructions: Lab MD 3 months.  Orders Placed This Encounter  Procedures  . CBC with Differential/Platelet    Standing Status:   Future    Standing Expiration Date:   09/08/2019  . Comprehensive metabolic panel    Standing Status:   Future    Standing Expiration Date:   09/08/2019  . Ferritin    Standing Status:   Future    Standing Expiration Date:   09/08/2019  . Iron and TIBC    Standing Status:   Future    Standing Expiration Date:   09/08/2019  . Vitamin D 25 hydroxy    Standing Status:   Future    Standing Expiration Date:   09/08/2019   Cc Dr.Shah Hemang  I discussed the assessment and treatment plan with the patient. The patient was provided an opportunity to ask questions and all were answered. The patient agreed with the plan and demonstrated an understanding of the instructions.  The patient was advised to call back or seek an in-person evaluation if the symptoms worsen or if the condition fails to improve as anticipated.    Earlie Server, MD 09/08/2018 8:22 PM

## 2018-09-08 NOTE — Progress Notes (Signed)
Patient contacted for telehealth visit. No concerns voiced.  

## 2018-10-17 DIAGNOSIS — M2550 Pain in unspecified joint: Secondary | ICD-10-CM | POA: Insufficient documentation

## 2018-10-17 DIAGNOSIS — R768 Other specified abnormal immunological findings in serum: Secondary | ICD-10-CM | POA: Insufficient documentation

## 2018-12-08 ENCOUNTER — Other Ambulatory Visit: Payer: Self-pay | Admitting: Oncology

## 2018-12-09 ENCOUNTER — Inpatient Hospital Stay: Payer: Commercial Managed Care - PPO

## 2018-12-09 ENCOUNTER — Inpatient Hospital Stay: Payer: Commercial Managed Care - PPO | Admitting: Oncology

## 2018-12-19 ENCOUNTER — Encounter: Payer: Self-pay | Admitting: Oncology

## 2018-12-19 ENCOUNTER — Inpatient Hospital Stay (HOSPITAL_BASED_OUTPATIENT_CLINIC_OR_DEPARTMENT_OTHER): Payer: Commercial Managed Care - PPO | Admitting: Oncology

## 2018-12-19 ENCOUNTER — Inpatient Hospital Stay: Payer: Commercial Managed Care - PPO | Attending: Oncology

## 2018-12-19 ENCOUNTER — Other Ambulatory Visit: Payer: Self-pay

## 2018-12-19 VITALS — BP 127/79 | HR 71 | Temp 96.8°F | Wt 151.4 lb

## 2018-12-19 DIAGNOSIS — D500 Iron deficiency anemia secondary to blood loss (chronic): Secondary | ICD-10-CM

## 2018-12-19 DIAGNOSIS — E559 Vitamin D deficiency, unspecified: Secondary | ICD-10-CM | POA: Diagnosis not present

## 2018-12-19 DIAGNOSIS — N920 Excessive and frequent menstruation with regular cycle: Secondary | ICD-10-CM | POA: Diagnosis not present

## 2018-12-19 LAB — CBC WITH DIFFERENTIAL/PLATELET
Abs Immature Granulocytes: 0.02 10*3/uL (ref 0.00–0.07)
Basophils Absolute: 0 10*3/uL (ref 0.0–0.1)
Basophils Relative: 1 %
Eosinophils Absolute: 0.6 10*3/uL — ABNORMAL HIGH (ref 0.0–0.5)
Eosinophils Relative: 8 %
HCT: 34 % — ABNORMAL LOW (ref 36.0–46.0)
Hemoglobin: 11.3 g/dL — ABNORMAL LOW (ref 12.0–15.0)
Immature Granulocytes: 0 %
Lymphocytes Relative: 28 %
Lymphs Abs: 2 10*3/uL (ref 0.7–4.0)
MCH: 27.9 pg (ref 26.0–34.0)
MCHC: 33.2 g/dL (ref 30.0–36.0)
MCV: 84 fL (ref 80.0–100.0)
Monocytes Absolute: 0.4 10*3/uL (ref 0.1–1.0)
Monocytes Relative: 6 %
Neutro Abs: 4.2 10*3/uL (ref 1.7–7.7)
Neutrophils Relative %: 57 %
Platelets: 263 10*3/uL (ref 150–400)
RBC: 4.05 MIL/uL (ref 3.87–5.11)
RDW: 12.5 % (ref 11.5–15.5)
WBC: 7.4 10*3/uL (ref 4.0–10.5)
nRBC: 0 % (ref 0.0–0.2)

## 2018-12-19 LAB — COMPREHENSIVE METABOLIC PANEL
ALT: 14 U/L (ref 0–44)
AST: 13 U/L — ABNORMAL LOW (ref 15–41)
Albumin: 4.1 g/dL (ref 3.5–5.0)
Alkaline Phosphatase: 64 U/L (ref 38–126)
Anion gap: 9 (ref 5–15)
BUN: 11 mg/dL (ref 6–20)
CO2: 22 mmol/L (ref 22–32)
Calcium: 8.7 mg/dL — ABNORMAL LOW (ref 8.9–10.3)
Chloride: 107 mmol/L (ref 98–111)
Creatinine, Ser: 0.73 mg/dL (ref 0.44–1.00)
GFR calc Af Amer: >60 mL/min (ref >60)
GFR calc non Af Amer: >60 mL/min (ref >60)
Glucose, Bld: 97 mg/dL (ref 70–99)
Potassium: 3.8 mmol/L (ref 3.5–5.1)
Sodium: 138 mmol/L (ref 135–145)
Total Bilirubin: 0.4 mg/dL (ref 0.3–1.2)
Total Protein: 7.9 g/dL (ref 6.5–8.1)

## 2018-12-19 LAB — IRON AND TIBC
Iron: 32 ug/dL (ref 28–170)
Saturation Ratios: 10 % — ABNORMAL LOW (ref 10.4–31.8)
TIBC: 315 ug/dL (ref 250–450)
UIBC: 283 ug/dL

## 2018-12-19 LAB — FERRITIN: Ferritin: 20 ng/mL (ref 11–307)

## 2018-12-19 NOTE — Progress Notes (Signed)
[facility] Progress Note Patient Care Team: Minna Antis as PCP - General (Physician Assistant) REASON FOR VISIT Follow up for treatment of iron deficiency anemia  HISTORY OF PRESENTING ILLNESS: Lisa Walker 33 y.o. [place] female with past medical history listed as below is referred by primary care provider to me for evaluation and management of iron deficiency anemia. Patient speaks English fluently and she tells me she is able to understand me well. Her husband came to the room and joined our conversation later. Patient reports having history of heavy menstrual period. She also had 2 termination of pregnancy. She was found to have iron deficiency anemia and has been on iron supplementation orally for the past year. However her iron store does not improve too much. Patient is still feels very fatigue and weakness. 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 She had the surgery for resection of the cyst and currently her neurologic symptoms are better. She is married and has 2 kids, youngest kid is 2 years old. She is not breast feeding. She is not a vegetarian and she consume red meat on  regular basis. Denies any family history of cancer.  Denies any stomach discomfort, chest pain, shortness of breath, abdominal pain, black stool, or blood in the stool. Denies any weight loss, fever or chills,. Reports fatigue, finger bone pain  # patient went to [facility] for evaluation of a painful right upper extremity mass. S/p incision biopsy. 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  .  INTERVAL HISTORY Patient presents for management of history of iron anemia.  Patient reports feeling well at baseline.  She stopped taking oral iron supplementation as advised at last visit .  Continues to feel well. Reports normal menstrual flow. Denies any fatigue, dizziness, lightheaded,  chest pain, shortness of breath or abdominal pain Appetite is good. Review of Systems  Constitutional: Negative for appetite change, chills, fatigue, fever and unexpected weight change.  HENT:   Negative for hearing loss, nosebleeds, sore throat and voice change.   Eyes: Negative for eye problems.  Respiratory: Negative for chest tightness, cough and hemoptysis.   Cardiovascular: Negative for chest pain.  Gastrointestinal: Negative for abdominal distention, abdominal pain, blood in stool and constipation.  Endocrine: Negative for hot flashes.  Genitourinary: Negative for difficulty urinating, dysuria, frequency and hematuria.   Musculoskeletal: Negative for arthralgias, flank pain and myalgias.  Skin: Negative for itching and rash.  Neurological: Negative for dizziness, extremity weakness, headaches, light-headedness and numbness.  Hematological: Negative for adenopathy.  Psychiatric/Behavioral: Negative for confusion and decreased concentration. The patient is not nervous/anxious.     MEDICAL HISTORY: Past Medical History:  Diagnosis Date  . Anemia   . Medical history non-contributory   . PONV (postoperative nausea and vomiting)     SURGICAL HISTORY: Past Surgical History:  Procedure Laterality Date  . CESAREAN SECTION N/A 2011   performed in [place]  . CESAREAN SECTION N/A 09/03/2014   Procedure: repeat CESAREAN SECTION;  Surgeon: Aletha Halim, MD;  Location: [facility] ORS;  Service: Obstetrics;  Laterality: N/A;    SOCIAL HISTORY: Social History   Socioeconomic History  . Marital status: Married    Spouse name: Not on file  . Number of children: Not on file  . Years of education: Not on file  . Highest education level: Not on file  Occupational History  . Not on file  Social Needs  .  Financial resource strain: Not on file  . Food insecurity    Worry: Not on file    Inability: Not on file  . Transportation needs    Medical: Not on file    Non-medical: Not on file   Tobacco Use  . Smoking status: Never Smoker  . Smokeless tobacco: Never Used  Substance and Sexual Activity  . Alcohol use: No    Alcohol/week: 0.0 standard drinks  . Drug use: No  . Sexual activity: Yes    Birth control/protection: None  Lifestyle  . Physical activity    Days per week: Not on file    Minutes per session: Not on file  . Stress: Not on file  Relationships  . Social Herbalist on phone: Not on file    Gets together: Not on file    Attends religious service: Not on file    Active member of club or organization: Not on file    Attends meetings of clubs or organizations: Not on file    Relationship status: Not on file  . Intimate partner violence    Fear of current or ex partner: Not on file    Emotionally abused: Not on file    Physically abused: Not on file    Forced sexual activity: Not on file  Other Topics Concern  . Not on file  Social History Narrative  . Not on file    FAMILY HISTORY Family History  Problem Relation Age of Onset  . Diabetes Father   . Diabetes Mother     ALLERGIES:  is allergic to contrast media [iodinated diagnostic agents]; multihance [gadobenate]; and pineapple.  MEDICATIONS:  No current outpatient medications on file.   No current facility-administered medications for this visit.     PHYSICAL EXAMINATION:  ECOG PERFORMANCE STATUS: 0 - Asymptomatic   Vitals:   12/19/18 1501  BP: 127/79  Pulse: 71  Temp: (!) 96.8 F (36 C)    Filed Weights   12/19/18 1501  Weight: 151 lb 6 oz (68.7 kg)     Physical Exam  Constitutional: She is oriented to person, place, and time. No distress.  HENT:  Head: Normocephalic and atraumatic.  Nose: Nose normal.  Mouth/Throat: Oropharynx is clear and moist. No oropharyngeal exudate.  Eyes: Pupils are equal, round, and reactive to light. EOM are normal. No scleral icterus.  Neck: Normal range of motion. Neck supple.  Cardiovascular: Normal rate and regular rhythm.   No murmur heard. Pulmonary/Chest: Effort normal. No respiratory distress. She has no rales. She exhibits no tenderness.  Abdominal: Soft. She exhibits no distension. There is no abdominal tenderness.  Musculoskeletal: Normal range of motion.        General: No edema.  Neurological: She is alert and oriented to person, place, and time. No cranial nerve deficit. She exhibits normal muscle tone. Coordination normal.  Skin: Skin is warm and dry. She is not diaphoretic. No erythema.  Psychiatric: Affect normal.      LABORATORY DATA: I have personally reviewed the data as listed: Labs from [place] showed 11/13/2016 ferritin 8 iron 39 TIBC 384 transferrin saturation 10. CBC hemoglobin 11.2 WBC 5.6 hematocrit 34 platelets 254 MCV 81.  Appointment on 12/19/2018  Component Date Value Ref Range Status  . Iron 12/19/2018 32  28 - 170 ug/dL Final  . TIBC 12/19/2018 315  250 - 450 ug/dL Final  . Saturation Ratios 12/19/2018 10* 10.4 - 31.8 % Final  . UIBC 12/19/2018  283  ug/dL Final   Performed at [facility], [address] [place], [place] [address]  . Ferritin 12/19/2018 20  11 - 307 ng/mL Final   Performed at [facility], [address] [place], [place] [address]  . Sodium 12/19/2018 138  135 - 145 mmol/L Final  . Potassium 12/19/2018 3.8  3.5 - 5.1 mmol/L Final  . Chloride 12/19/2018 107  98 - 111 mmol/L Final  . CO2 12/19/2018 22  22 - 32 mmol/L Final  . Glucose, Bld 12/19/2018 97  70 - 99 mg/dL Final  . BUN 12/19/2018 11  6 - 20 mg/dL Final  . Creatinine, Ser 12/19/2018 0.73  0.44 - 1.00 mg/dL Final  . Calcium 12/19/2018 8.7* 8.9 - 10.3 mg/dL Final  . Total Protein 12/19/2018 7.9  6.5 - 8.1 g/dL Final  . Albumin 12/19/2018 4.1  3.5 - 5.0 g/dL Final  . AST 12/19/2018 13* 15 - 41 U/L Final  . ALT 12/19/2018 14  0 - 44 U/L Final  . Alkaline Phosphatase 12/19/2018 64  38 - 126 U/L Final  . Total Bilirubin 12/19/2018 0.4  0.3 - 1.2 mg/dL Final  . GFR  calc non Af Amer 12/19/2018 >60  >60 mL/min Final  . GFR calc Af Amer 12/19/2018 >60  >60 mL/min Final  . Anion gap 12/19/2018 9  5 - 15 Final   Performed at [facility], [address], [place], [place] [address]  . WBC 12/19/2018 7.4  4.0 - 10.5 K/uL Final  . RBC 12/19/2018 4.05  3.87 - 5.11 MIL/uL Final  . Hemoglobin 12/19/2018 11.3* 12.0 - 15.0 g/dL Final  . HCT 12/19/2018 34.0* 36.0 - 46.0 % Final  . MCV 12/19/2018 84.0  80.0 - 100.0 fL Final  . MCH 12/19/2018 27.9  26.0 - 34.0 pg Final  . MCHC 12/19/2018 33.2  30.0 - 36.0 g/dL Final  . RDW 12/19/2018 12.5  11.5 - 15.5 % Final  . Platelets 12/19/2018 263  150 - 400 K/uL Final  . nRBC 12/19/2018 0.0  0.0 - 0.2 % Final  . Neutrophils Relative % 12/19/2018 57  % Final  . Neutro Abs 12/19/2018 4.2  1.7 - 7.7 K/uL Final  . Lymphocytes Relative 12/19/2018 28  % Final  . Lymphs Abs 12/19/2018 2.0  0.7 - 4.0 K/uL Final  . Monocytes Relative 12/19/2018 6  % Final  . Monocytes Absolute 12/19/2018 0.4  0.1 - 1.0 K/uL Final  . Eosinophils Relative 12/19/2018 8  % Final  . Eosinophils Absolute 12/19/2018 0.6* 0.0 - 0.5 K/uL Final  . Basophils Relative 12/19/2018 1  % Final  . Basophils Absolute 12/19/2018 0.0  0.0 - 0.1 K/uL Final  . Immature Granulocytes 12/19/2018 0  % Final  . Abs Immature Granulocytes 12/19/2018 0.02  0.00 - 0.07 K/uL Final   Performed at [facility], [address] [place], [place] [address]   Iron/TIBC/Ferritin/ %Sat    Component Value Date/Time   IRON 32 12/19/2018 1437   TIBC 315 12/19/2018 1437   FERRITIN 20 12/19/2018 1437   IRONPCTSAT 10 (L) 12/19/2018 1437   ASSESSMENT/PLAN 1. Iron deficiency anemia due to chronic blood loss   2. Vitamin D deficiency    # Iron deficiency Anemia:  Labs reviewed and discussed with patient. Hemoglobin 11.3. Iron panel shows ferritin of 20, with iron saturation of 10. Advised patient to resume taking oral iron supplementation 325 mg twice daily.  Vitamin  D deficiency,Levels are pending today.  Continue supplementation  All questions were answered. The patient knows to call the clinic with any problems, questions or concerns. Follow up in 3 months.   Earlie Server, MD, PhD Hematology Oncology [facility] at [facility] Pager- [phone] 12/19/2018

## 2018-12-19 NOTE — Progress Notes (Signed)
Patient here today for follow up.  Patient states no SOB or fatigue.  

## 2018-12-20 ENCOUNTER — Encounter: Payer: Self-pay | Admitting: *Deleted

## 2018-12-20 LAB — VITAMIN D 25 HYDROXY (VIT D DEFICIENCY, FRACTURES): Vit D, 25-Hydroxy: 23.9 ng/mL — ABNORMAL LOW (ref 30.0–100.0)

## 2019-02-17 ENCOUNTER — Encounter
Admission: RE | Admit: 2019-02-17 | Discharge: 2019-02-17 | Disposition: A | Discharge status: Home / Self Care | Payer: Commercial Managed Care - PPO | Source: Ambulatory Visit | Attending: Obstetrics & Gynecology | Admitting: Obstetrics & Gynecology

## 2019-02-17 ENCOUNTER — Other Ambulatory Visit: Payer: Self-pay

## 2019-02-17 HISTORY — DX: Anxiety disorder, unspecified: F41.9

## 2019-02-17 HISTORY — DX: Gastro-esophageal reflux disease without esophagitis: K21.9

## 2019-02-17 NOTE — Patient Instructions (Signed)
Your procedure is scheduled on: 02-24-19 FRIDAY Report to Same Day Surgery 2nd floor medical mall [facility] Entrance-take elevator on left to 2nd floor.  Check in with surgery information desk.) To find out your arrival time please call [phone] between 1PM - 3PM on 02-23-19 THURSDAY  Remember: Instructions that are not followed completely may result in serious medical risk, up to and including death, or upon the discretion of your surgeon and anesthesiologist your surgery may need to be rescheduled.    _x___ 1. Do not eat food after midnight the night before your procedure. NO GUM OR CANDY AFTER MIDNIGHT. You may drink clear liquids up to 2 hours before you are scheduled to arrive at the hospital for your procedure.  Do not drink clear liquids within 2 hours of your scheduled arrival to the hospital.  Clear liquids include  --Water or Apple juice without pulp  --Gatorade  --Black Coffee or Clear Tea (No milk, no creamers, do not add anything to the coffee or Tea   ____Ensure clear carbohydrate drink on the way to the hospital for bariatric patients  _X___Ensure clear carbohydrate drink 3 hours before surgery.    __x__ 2. No Alcohol for 24 hours before or after surgery.   __x__3. No Smoking or e-cigarettes for 24 prior to surgery.  Do not use any chewable tobacco products for at least 6 hour prior to surgery   ____  4. Bring all medications with you on the day of surgery if instructed.    __x__ 5. Notify your doctor if there is any change in your medical condition     (cold, fever, infections).    x___6. On the morning of surgery brush your teeth with toothpaste and water.  You may rinse your mouth with mouth wash if you wish.  Do not swallow any toothpaste or mouthwash.   Do not wear jewelry, make-up, hairpins, clips or nail polish.  Do not wear lotions, powders, or perfumes  Do not shave 48 hours prior to surgery. Men may shave face and neck.  Do not bring valuables to  the hospital.    [facility] is not responsible for any belongings or valuables.               Contacts, dentures or bridgework may not be worn into surgery.  Leave your suitcase in the car. After surgery it may be brought to your room.  For patients admitted to the hospital, discharge time is determined by your  treatment team.  _  Patients discharged the day of surgery will not be allowed to drive home.  You will need someone to drive you home and stay with you the night of your procedure.    Please read over the following fact sheets that you were given:   [facility] Preparing for Surgery and or MRSA Information   ____ Take anti-hypertensive listed below, cardiac, seizure, asthma,anti-reflux and psychiatric medicines. These include:  1. NONE  2.  3.  4.  5.  6.  ____Fleets enema or Magnesium Citrate as directed.   ____ Use CHG Soap or sage wipes as directed on instruction sheet   ____ Use inhalers on the day of surgery and bring to hospital day of surgery  ____ Stop Metformin and Janumet 2 days prior to surgery.    ____ Take 1/2 of usual insulin dose the night before surgery and none on the morning surgery.   ____ Follow recommendations from Cardiologist, Pulmonologist or PCP regarding stopping  Aspirin, Coumadin, Plavix ,Eliquis, Effient, or Pradaxa, and Pletal.  X____Stop Anti-inflammatories such as Advil, Aleve, Ibuprofen, Motrin, Naproxen, Naprosyn, Goodies powders or aspirin products NOW-OK to take Tylenol    ____ Stop supplements until after surgery.     ____ Bring C-Pap to the hospital.

## 2019-02-21 ENCOUNTER — Other Ambulatory Visit
Admission: RE | Admit: 2019-02-21 | Discharge: 2019-02-21 | Disposition: A | Discharge status: Home / Self Care | Payer: Commercial Managed Care - PPO | Source: Ambulatory Visit | Attending: Obstetrics & Gynecology | Admitting: Obstetrics & Gynecology

## 2019-02-21 ENCOUNTER — Other Ambulatory Visit: Payer: Self-pay

## 2019-02-21 DIAGNOSIS — Z01812 Encounter for preprocedural laboratory examination: Secondary | ICD-10-CM | POA: Diagnosis not present

## 2019-02-21 DIAGNOSIS — Z20828 Contact with and (suspected) exposure to other viral communicable diseases: Secondary | ICD-10-CM | POA: Diagnosis not present

## 2019-02-21 LAB — TYPE AND SCREEN
ABO/RH(D): B POS
Antibody Screen: NEGATIVE

## 2019-02-21 LAB — HEMOGLOBIN: Hemoglobin: 11.6 g/dL — ABNORMAL LOW (ref 12.0–15.0)

## 2019-02-21 LAB — SARS CORONAVIRUS 2 (TAT 6-24 HRS): SARS Coronavirus 2: NEGATIVE

## 2019-02-24 ENCOUNTER — Encounter
Admission: RE | Disposition: A | Discharge status: Home / Self Care | Payer: Self-pay | Source: Home / Self Care | Attending: Obstetrics & Gynecology

## 2019-02-24 ENCOUNTER — Encounter: Payer: Self-pay | Admitting: *Deleted

## 2019-02-24 ENCOUNTER — Other Ambulatory Visit: Payer: Self-pay

## 2019-02-24 ENCOUNTER — Ambulatory Visit: Payer: Commercial Managed Care - PPO | Admitting: Certified Registered"

## 2019-02-24 ENCOUNTER — Ambulatory Visit
Admission: RE | Admit: 2019-02-24 | Discharge: 2019-02-24 | Disposition: A | Discharge status: Home / Self Care | Payer: Commercial Managed Care - PPO | Attending: Obstetrics & Gynecology | Admitting: Obstetrics & Gynecology

## 2019-02-24 DIAGNOSIS — N938 Other specified abnormal uterine and vaginal bleeding: Secondary | ICD-10-CM | POA: Diagnosis not present

## 2019-02-24 DIAGNOSIS — R9389 Abnormal findings on diagnostic imaging of other specified body structures: Secondary | ICD-10-CM | POA: Insufficient documentation

## 2019-02-24 DIAGNOSIS — N939 Abnormal uterine and vaginal bleeding, unspecified: Secondary | ICD-10-CM | POA: Diagnosis present

## 2019-02-24 HISTORY — PX: HYSTEROSCOPY WITH D & C: SHX1775

## 2019-02-24 HISTORY — DX: Other specified postprocedural states: Z98.890

## 2019-02-24 LAB — POCT PREGNANCY, URINE: Preg Test, Ur: NEGATIVE

## 2019-02-24 SURGERY — DILATATION AND CURETTAGE /HYSTEROSCOPY
Anesthesia: General

## 2019-02-24 MED ORDER — MIDAZOLAM HCL 2 MG/2ML IJ SOLN
INTRAMUSCULAR | Status: DC | PRN
  Administered 2019-02-24: 2 mg via INTRAVENOUS

## 2019-02-24 MED ORDER — MIDAZOLAM HCL 2 MG/2ML IJ SOLN
INTRAMUSCULAR | Status: AC
  Filled 2019-02-24: qty 2

## 2019-02-24 MED ORDER — LIDOCAINE HCL (PF) 2 % IJ SOLN
INTRAMUSCULAR | Status: AC
  Filled 2019-02-24: qty 10

## 2019-02-24 MED ORDER — LACTATED RINGERS IV SOLN
INTRAVENOUS | Status: DC
  Administered 2019-02-24: 07:00:00 via INTRAVENOUS

## 2019-02-24 MED ORDER — FENTANYL CITRATE (PF) 100 MCG/2ML IJ SOLN: 25.0000 ug | INTRAMUSCULAR | Status: DC | PRN

## 2019-02-24 MED ORDER — FENTANYL CITRATE (PF) 100 MCG/2ML IJ SOLN
25.0000 ug | INTRAMUSCULAR | Status: DC | PRN
  Administered 2019-02-24 (×4): 25 ug via INTRAVENOUS

## 2019-02-24 MED ORDER — ONDANSETRON HCL 4 MG/2ML IJ SOLN
INTRAMUSCULAR | Status: DC | PRN
  Administered 2019-02-24: 4 mg via INTRAVENOUS

## 2019-02-24 MED ORDER — FENTANYL CITRATE (PF) 100 MCG/2ML IJ SOLN
INTRAMUSCULAR | Status: DC | PRN
  Administered 2019-02-24 (×2): 25 ug via INTRAVENOUS

## 2019-02-24 MED ORDER — PROPOFOL 500 MG/50ML IV EMUL
INTRAVENOUS | Status: DC | PRN
  Administered 2019-02-24: 160 ug/kg/min via INTRAVENOUS

## 2019-02-24 MED ORDER — ACETAMINOPHEN 650 MG RE SUPP
650.0000 mg | RECTAL | Status: DC | PRN
  Filled 2019-02-24: qty 1

## 2019-02-24 MED ORDER — PROPOFOL 10 MG/ML IV BOLUS
INTRAVENOUS | Status: AC
  Filled 2019-02-24: qty 20

## 2019-02-24 MED ORDER — PROMETHAZINE HCL 25 MG/ML IJ SOLN: 6.2500 mg | INTRAMUSCULAR | Status: DC | PRN

## 2019-02-24 MED ORDER — KETOROLAC TROMETHAMINE 30 MG/ML IJ SOLN
30.0000 mg | Freq: Four times a day (QID) | INTRAMUSCULAR | Status: DC
  Administered 2019-02-24: 30 mg via INTRAVENOUS
  Filled 2019-02-24: qty 1

## 2019-02-24 MED ORDER — DEXAMETHASONE SODIUM PHOSPHATE 10 MG/ML IJ SOLN
INTRAMUSCULAR | Status: AC
  Filled 2019-02-24: qty 1

## 2019-02-24 MED ORDER — SCOPOLAMINE 1 MG/3DAYS TD PT72
1.0000 | MEDICATED_PATCH | Freq: Once | TRANSDERMAL | Status: AC
  Administered 2019-02-24: 07:00:00 1.5 mg via TRANSDERMAL

## 2019-02-24 MED ORDER — LACTATED RINGERS IV SOLN: INTRAVENOUS | Status: DC

## 2019-02-24 MED ORDER — FENTANYL CITRATE (PF) 100 MCG/2ML IJ SOLN
INTRAMUSCULAR | Status: AC
  Filled 2019-02-24: qty 2

## 2019-02-24 MED ORDER — KETOROLAC TROMETHAMINE 30 MG/ML IJ SOLN
INTRAMUSCULAR | Status: AC
  Filled 2019-02-24: qty 1

## 2019-02-24 MED ORDER — ONDANSETRON HCL 4 MG/2ML IJ SOLN
INTRAMUSCULAR | Status: AC
  Filled 2019-02-24: qty 2

## 2019-02-24 MED ORDER — LIDOCAINE HCL (CARDIAC) PF 100 MG/5ML IV SOSY
PREFILLED_SYRINGE | INTRAVENOUS | Status: DC | PRN
  Administered 2019-02-24: 80 mg via INTRAVENOUS

## 2019-02-24 MED ORDER — SCOPOLAMINE 1 MG/3DAYS TD PT72
MEDICATED_PATCH | TRANSDERMAL | Status: AC
  Administered 2019-02-24: 1.5 mg via TRANSDERMAL
  Filled 2019-02-24: qty 1

## 2019-02-24 MED ORDER — SILVER NITRATE-POT NITRATE 75-25 % EX MISC
CUTANEOUS | Status: AC
  Filled 2019-02-24: qty 1

## 2019-02-24 MED ORDER — SILVER NITRATE-POT NITRATE 75-25 % EX MISC
CUTANEOUS | Status: DC | PRN
  Administered 2019-02-24: 2 via TOPICAL

## 2019-02-24 MED ORDER — FAMOTIDINE 20 MG PO TABS
ORAL_TABLET | ORAL | Status: AC
  Filled 2019-02-24: qty 1

## 2019-02-24 MED ORDER — ACETAMINOPHEN 325 MG PO TABS
650.0000 mg | ORAL_TABLET | ORAL | Status: DC | PRN
  Administered 2019-02-24: 650 mg via ORAL

## 2019-02-24 MED ORDER — FENTANYL CITRATE (PF) 100 MCG/2ML IJ SOLN
INTRAMUSCULAR | Status: AC
  Administered 2019-02-24: 25 ug via INTRAVENOUS
  Filled 2019-02-24: qty 2

## 2019-02-24 MED ORDER — ACETAMINOPHEN 325 MG PO TABS
ORAL_TABLET | ORAL | Status: AC
  Filled 2019-02-24: qty 2

## 2019-02-24 MED ORDER — FAMOTIDINE 20 MG PO TABS
20.0000 mg | ORAL_TABLET | Freq: Once | ORAL | Status: AC
  Administered 2019-02-24: 07:00:00 20 mg via ORAL

## 2019-02-24 MED ORDER — DEXAMETHASONE SODIUM PHOSPHATE 10 MG/ML IJ SOLN
INTRAMUSCULAR | Status: DC | PRN
  Administered 2019-02-24: 10 mg via INTRAVENOUS

## 2019-02-24 SURGICAL SUPPLY — 22 items
CATH ROBINSON RED A/P 16FR (CATHETERS) ×3 IMPLANT
COVER WAND RF STERILE (DRAPES) ×3 IMPLANT
DEVICE MYOSURE LITE (MISCELLANEOUS) IMPLANT
DEVICE MYOSURE REACH (MISCELLANEOUS) IMPLANT
ELECT REM PT RETURN 9FT ADLT (ELECTROSURGICAL) ×3
ELECTRODE REM PT RTRN 9FT ADLT (ELECTROSURGICAL) ×1 IMPLANT
GLOVE PI ORTHOPRO 6.5 (GLOVE) ×6
GLOVE PI ORTHOPRO STRL 6.5 (GLOVE) ×1 IMPLANT
GLOVE SURG SYN 6.5 ES PF (GLOVE) ×9 IMPLANT
GLOVE SURG SYN 6.5 PF PI (GLOVE) ×2 IMPLANT
GOWN STRL REUS W/ TWL LRG LVL3 (GOWN DISPOSABLE) ×2 IMPLANT
GOWN STRL REUS W/TWL LRG LVL3 (GOWN DISPOSABLE) ×4
KIT PROCEDURE FLUENT (KITS) IMPLANT
KIT TURNOVER CYSTO (KITS) ×3 IMPLANT
PACK DNC HYST (MISCELLANEOUS) ×3 IMPLANT
PAD OB MATERNITY 4.3X12.25 (PERSONAL CARE ITEMS) ×3 IMPLANT
PAD PREP 24X41 OB/GYN DISP (PERSONAL CARE ITEMS) ×3 IMPLANT
SEAL ROD LENS SCOPE MYOSURE (ABLATOR) ×3 IMPLANT
SOL .9 NS 3000ML IRR  AL (IV SOLUTION) ×2
SOL .9 NS 3000ML IRR UROMATIC (IV SOLUTION) ×1 IMPLANT
TUBING CONNECTING 10 (TUBING) ×2 IMPLANT
TUBING CONNECTING 10' (TUBING) ×1

## 2019-02-24 NOTE — Anesthesia Procedure Notes (Signed)
Procedure Name: LMA Insertion Date/Time: 02/24/2019 7:54 AM Performed by: Rona Ravens, CRNA Pre-anesthesia Checklist: Patient identified, Emergency Drugs available, Suction available, Patient being monitored and Timeout performed Patient Re-evaluated:Patient Re-evaluated prior to induction Oxygen Delivery Method: Circle system utilized Preoxygenation: Pre-oxygenation with 100% oxygen Induction Type: IV induction LMA: LMA inserted LMA Size: 3.5 Number of attempts: 1 Placement Confirmation: positive ETCO2,  CO2 detector and breath sounds checked- equal and bilateral Tube secured with: Tape Dental Injury: Teeth and Oropharynx as per pre-operative assessment

## 2019-02-24 NOTE — Discharge Instructions (Signed)
You should expect to have some cramping and vaginal bleeding for about a week. This should taper off and subside, much like a period. If heavy bleeding continues or gets worse, you should contact the office for an earlier appointment.   Please call the office or physician on call for fever >101, severe pain, and heavy bleeding.   [phone]  NOTHING IN THE VAGINA FOR 2 WEEKS!!  Dr. Ward will discuss pathology results with you at your postop visit.    AMBULATORY SURGERY  DISCHARGE INSTRUCTIONS   1) The drugs that you were given will stay in your system until tomorrow so for the next 24 hours you should not:  A) Drive an automobile B) Make any legal decisions C) Drink any alcoholic beverage   2) You may resume regular meals tomorrow.  Today it is better to start with liquids and gradually work up to solid foods.  You may eat anything you prefer, but it is better to start with liquids, then soup and crackers, and gradually work up to solid foods.   3) Please notify your doctor immediately if you have any unusual bleeding, trouble breathing, redness and pain at the surgery site, drainage, fever, or pain not relieved by medication.    4) Additional Instructions:        Please contact your physician with any problems or Same Day Surgery at [phone], Monday through Friday 6 am to 4 pm, or [facility] [place] Main number at [phone].  

## 2019-02-24 NOTE — Anesthesia Preprocedure Evaluation (Signed)
Anesthesia Evaluation  Patient identified by MRN, date of birth, ID band Patient awake    Reviewed: Allergy & Precautions, NPO status , Patient's Chart, lab work & pertinent test results  History of Anesthesia Complications (+) PONV and history of anesthetic complications  Airway Mallampati: III  TM Distance: >3 FB Neck ROM: Full    Dental  (+) Dental Advidsory Given, Teeth Intact, Caps   Pulmonary neg pulmonary ROS,    Pulmonary exam normal        Cardiovascular Exercise Tolerance: Good negative cardio ROS Normal cardiovascular exam     Neuro/Psych PSYCHIATRIC DISORDERS Anxiety negative neurological ROS     GI/Hepatic negative GI ROS, Neg liver ROS,   Endo/Other  negative endocrine ROS  Renal/GU negative Renal ROS  negative genitourinary   Musculoskeletal negative musculoskeletal ROS (+)   Abdominal   Peds negative pediatric ROS (+)  Hematology negative hematology ROS (+)   Anesthesia Other Findings Past Medical History: No date: Anemia No date: Anxiety No date: GERD (gastroesophageal reflux disease)     Comment:  H/O No date: H/O cervical spine surgery     Comment:  pt reports there is metal in neck No date: PONV (postoperative nausea and vomiting)   Reproductive/Obstetrics negative OB ROS                             Anesthesia Physical  Anesthesia Plan  ASA: II  Anesthesia Plan: General   Post-op Pain Management:    Induction: Intravenous  PONV Risk Score and Plan: 3 and Scopolamine patch - Pre-op, Midazolam, Ondansetron, Dexamethasone, Promethazine and Treatment may vary due to age or medical condition  Airway Management Planned: LMA  Additional Equipment:   Intra-op Plan:   Post-operative Plan: Extubation in OR  Informed Consent: I have reviewed the patients History and Physical, chart, labs and discussed the procedure including the risks, benefits and  alternatives for the proposed anesthesia with the patient or authorized representative who has indicated his/her understanding and acceptance.       Plan Discussed with: CRNA and Surgeon  Anesthesia Plan Comments:         Anesthesia Quick Evaluation

## 2019-02-24 NOTE — Anesthesia Post-op Follow-up Note (Signed)
Anesthesia QCDR form completed.        

## 2019-02-24 NOTE — H&P (Signed)
Preoperative History and Physical  Lisa Walker is a 33 y.o. [id number] here for surgical management of abnormal uterine bleeding and suspected endometrial polyp.   No significant preoperative concerns.  Proposed surgery: dilation and curettage, hysteroscopy  Past Medical History:  Diagnosis Date  . Anemia   . Anxiety   . GERD (gastroesophageal reflux disease)    H/O  . H/O cervical spine surgery    pt reports there is metal in neck  . PONV (postoperative nausea and vomiting)    Past Surgical History:  Procedure Laterality Date  . CESAREAN SECTION N/A 2011   performed in [place]  . CESAREAN SECTION N/A 09/03/2014   Procedure: repeat CESAREAN SECTION;  Surgeon: Aletha Halim, MD;  Location: [facility] ORS;  Service: Obstetrics;  Laterality: N/A;  . SKIN BIOPSY Right    OB History  Gravida Para Term Preterm AB Living  3 2 1   1 2   SAB TAB Ectopic Multiple Live Births  1     0 2    # Outcome Date GA Lbr Len/2nd Weight Sex Delivery Anes PTL Lv  3 Term 09/03/14 [redacted]w[redacted]d  3189 g M CS-LTranv Spinal  LIV  2 Para 01/03/10 [redacted]w[redacted]d  3500 g F CS-Unspec Gen N LIV     Complications: Breech delivery  1 SAB 11/25/08 [redacted]w[redacted]d   U SAB None N   Patient denies any other pertinent gynecologic issues.   No current facility-administered medications on file prior to encounter.    No current outpatient medications on file prior to encounter.   Allergies  Allergen Reactions  . Contrast Media [Iodinated Diagnostic Agents] Shortness Of Breath  . Multihance [Gadobenate] Shortness Of Breath and Cough  . Pineapple Other (See Comments)    Upsets stomach     Social History:   reports that she has never smoked. She has never used smokeless tobacco. She reports that she does not drink alcohol or use drugs.  Family History  Problem Relation Age of Onset  . Diabetes Father   . Diabetes Mother     Review of Systems: Noncontributory  PHYSICAL EXAM: Blood pressure 112/65, pulse 71, temperature (!) 97.2 F (36.2 C),  temperature source Tympanic, resp. rate 16, height 5\' 4"  (1.626 m), weight 69 kg, last menstrual period 02/10/2019, SpO2 100 %. General appearance - alert, well appearing, and in no distress Chest - clear to auscultation, no wheezes, rales or rhonchi, symmetric air entry Heart - normal rate and regular rhythm Abdomen - soft, nontender, nondistended, no masses or organomegaly Pelvic - examination not indicated Extremities - peripheral pulses normal, no pedal edema, no clubbing or cyanosis  Labs: Results for orders placed or performed during the hospital encounter of 02/24/19 (from the past 336 hour(s))  Pregnancy, urine POC   Collection Time: 02/24/19  6:41 AM  Result Value Ref Range   Preg Test, Ur NEGATIVE NEGATIVE  Results for orders placed or performed during the hospital encounter of 02/21/19 (from the past 336 hour(s))  SARS CORONAVIRUS 2 (TAT 6-24 HRS) Nasopharyngeal Nasopharyngeal Swab   Collection Time: 02/21/19  8:34 AM   Specimen: Nasopharyngeal Swab  Result Value Ref Range   SARS Coronavirus 2 NEGATIVE NEGATIVE  Hemoglobin   Collection Time: 02/21/19  8:34 AM  Result Value Ref Range   Hemoglobin 11.6 (L) 12.0 - 15.0 g/dL  Type and screen [place]   Collection Time: 02/21/19  8:34 AM  Result Value Ref Range   ABO/RH(D) B POS    Antibody Screen  NEG    Sample Expiration 03/07/2019,2359    Extend sample reason      NO TRANSFUSIONS OR PREGNANCY IN THE PAST 3 MONTHS Performed at [facility], [address] [place], [place] [address]     Assessment: Abnormal uterine bleeding  Plan: Patient will undergo surgical management with dilation and curettage, hysteroscopy.   The risks of surgery were discussed in detail with the patient including but not limited to: bleeding which may require transfusion or reoperation; infection which may require antibiotics; injury to surrounding organs which may involve bowel, bladder, ureters ; need for  additional procedures including laparoscopy or laparotomy; thromboembolic phenomenon, surgical site problems and other postoperative/anesthesia complications. Likelihood of success in alleviating the patient's condition was discussed. Routine postoperative instructions will be reviewed with the patient and her family in detail after surgery.  The patient concurred with the proposed plan, giving informed written consent for the surgery.  Patient has been NPO since last night she will remain NPO for procedure.  Anesthesia and OR aware.  SCDs ordered on call to the OR.  To OR when ready.  ----- Larey Days, MD, [place] Attending Obstetrician and Gynecologist [facility], Department of [facility]

## 2019-02-24 NOTE — Op Note (Signed)
Operative Report Hysteroscopy, Dilation and Curettage 02/24/2019  Patient:  Lisa Walker  33 y.o. female Preoperative diagnosis:  thickened endometrium, dysfunctional uterine bleeding Postoperative diagnosis:  thickened endometrium, dysfunctional uterine bleeding  PROCEDURE:  Procedure(s): DILATATION AND CURETTAGE /HYSTEROSCOPY (N/A) Surgeon:  Surgeon(s) and Role:    * Ward, Honor Loh, MD - Primary Anesthesia:  LMA I/O: Total I/O In: 650 [I.V.:650] Out: 5 [Blood:5] Specimens:  Endometrial curettings Complications: None Apparent Disposition:  VS stable to PACU  Findings: Uterus, mobile, normal size, sounding to 9 cm; normal cervix, vagina, perineum.  Posterior fluffy endometrium consistent with sessile polyp  Indication for procedure/Consents: 33 y.o. [id number]  here for scheduled surgery for the aforementioned diagnoses.  Risks of surgery were discussed with the patient including but not limited to: bleeding which may require transfusion; infection which may require antibiotics; injury to uterus or surrounding organs; intrauterine scarring which may impair future fertility; need for additional procedures including laparotomy or laparoscopy; and other postoperative/anesthesia complications. Written informed consent was obtained.    Procedure Details:   The patient was then taken to the operating room where anesthesia was administered and was found to be adequate.  After a formal timeout was performed, she was placed in the dorsal lithotomy position and examined with the above findings. She was then prepped and draped in the sterile manner.  A speculum was then placed in the patient's vagina and a single tooth tenaculum was applied to the anterior lip of the cervix.     The uterus was sounded to 9cm. Her cervix was serially dilated to accommodate the myoscope, with findings as above. A sharp curettage was then performed until there was a gritty texture in all four quadrants. The specimen was  handed off to nursing.  The camera was reinserted and confirmed the uterus had been evacuated. The tenaculum was removed from the anterior lip of the cervix and the vaginal speculum was removed after silver nitrate application and noting good hemostasis. The patient tolerated the procedure well and was taken to the recovery area awake, extubated and in stable condition.  The patient will be discharged to home as per PACU criteria.  Routine postoperative instructions given. She will follow up in the clinic in two to four weeks for postoperative evaluation.  Larey Days, MD [facility] - Inpatient OBGYN Attending Gynecologist

## 2019-02-24 NOTE — Anesthesia Postprocedure Evaluation (Signed)
Anesthesia Post Note  Patient: Lisa Walker  Procedure(s) Performed: DILATATION AND CURETTAGE /HYSTEROSCOPY (N/A )  Patient location during evaluation: PACU Anesthesia Type: General Level of consciousness: awake and alert Pain management: pain level controlled Vital Signs Assessment: post-procedure vital signs reviewed and stable Respiratory status: spontaneous breathing, nonlabored ventilation, respiratory function stable and patient connected to nasal cannula oxygen Cardiovascular status: blood pressure returned to baseline and stable Postop Assessment: no apparent nausea or vomiting Anesthetic complications: no     Last Vitals:  Vitals:   02/24/19 0956 02/24/19 1037  BP: 115/77 102/60  Pulse: 64 (!) 57  Resp: 14 14  Temp: (!) 36.3 C   SpO2: 100% 100%    Last Pain:  Vitals:   02/24/19 1037  TempSrc:   PainSc: 7                  Martha Clan

## 2019-02-24 NOTE — Addendum Note (Signed)
Addendum  created 02/24/19 1227 by Rona Ravens, CRNA   Charge Capture section accepted

## 2019-02-24 NOTE — Transfer of Care (Signed)
Immediate Anesthesia Transfer of Care Note  Patient: Lisa Walker  Procedure(s) Performed: DILATATION AND CURETTAGE /HYSTEROSCOPY (N/A )  Patient Location: PACU  Anesthesia Type:General  Level of Consciousness: awake and responds to stimulation  Airway & Oxygen Therapy: Patient Spontanous Breathing and Patient connected to face mask oxygen  Post-op Assessment: Report given to RN and Post -op Vital signs reviewed and stable  Post vital signs: Reviewed and stable  Last Vitals:  Vitals Value Taken Time  BP 112/71 02/24/19 0834  Temp 36.4 C 02/24/19 0834  Pulse 87 02/24/19 0837  Resp 18 02/24/19 0837  SpO2 100 % 02/24/19 0837  Vitals shown include unvalidated device data.  Last Pain:  Vitals:   02/24/19 0834  TempSrc:   PainSc: Asleep         Complications: No apparent anesthesia complications

## 2019-02-25 ENCOUNTER — Encounter: Payer: Self-pay | Admitting: Obstetrics & Gynecology

## 2019-02-27 LAB — SURGICAL PATHOLOGY

## 2019-04-06 ENCOUNTER — Other Ambulatory Visit: Payer: Self-pay

## 2019-04-06 ENCOUNTER — Inpatient Hospital Stay: Payer: Commercial Managed Care - PPO | Attending: Oncology

## 2019-04-06 DIAGNOSIS — D509 Iron deficiency anemia, unspecified: Secondary | ICD-10-CM | POA: Diagnosis not present

## 2019-04-06 DIAGNOSIS — Z833 Family history of diabetes mellitus: Secondary | ICD-10-CM | POA: Insufficient documentation

## 2019-04-06 DIAGNOSIS — Z98891 History of uterine scar from previous surgery: Secondary | ICD-10-CM | POA: Insufficient documentation

## 2019-04-06 DIAGNOSIS — R202 Paresthesia of skin: Secondary | ICD-10-CM | POA: Diagnosis not present

## 2019-04-06 DIAGNOSIS — Z79899 Other long term (current) drug therapy: Secondary | ICD-10-CM | POA: Insufficient documentation

## 2019-04-06 DIAGNOSIS — R200 Anesthesia of skin: Secondary | ICD-10-CM | POA: Insufficient documentation

## 2019-04-06 DIAGNOSIS — D500 Iron deficiency anemia secondary to blood loss (chronic): Secondary | ICD-10-CM

## 2019-04-06 LAB — CBC WITH DIFFERENTIAL/PLATELET
Abs Immature Granulocytes: 0.02 10*3/uL (ref 0.00–0.07)
Basophils Absolute: 0 10*3/uL (ref 0.0–0.1)
Basophils Relative: 0 %
Eosinophils Absolute: 0.6 10*3/uL — ABNORMAL HIGH (ref 0.0–0.5)
Eosinophils Relative: 6 %
HCT: 37.1 % (ref 36.0–46.0)
Hemoglobin: 11.6 g/dL — ABNORMAL LOW (ref 12.0–15.0)
Immature Granulocytes: 0 %
Lymphocytes Relative: 29 %
Lymphs Abs: 2.8 10*3/uL (ref 0.7–4.0)
MCH: 27.2 pg (ref 26.0–34.0)
MCHC: 31.3 g/dL (ref 30.0–36.0)
MCV: 86.9 fL (ref 80.0–100.0)
Monocytes Absolute: 0.6 10*3/uL (ref 0.1–1.0)
Monocytes Relative: 7 %
Neutro Abs: 5.6 10*3/uL (ref 1.7–7.7)
Neutrophils Relative %: 58 %
Platelets: 263 10*3/uL (ref 150–400)
RBC: 4.27 MIL/uL (ref 3.87–5.11)
RDW: 12.7 % (ref 11.5–15.5)
WBC: 9.7 10*3/uL (ref 4.0–10.5)
nRBC: 0 % (ref 0.0–0.2)

## 2019-04-06 LAB — IRON AND TIBC
Iron: 54 ug/dL (ref 28–170)
Saturation Ratios: 15 % (ref 10.4–31.8)
TIBC: 353 ug/dL (ref 250–450)
UIBC: 299 ug/dL

## 2019-04-06 LAB — FERRITIN: Ferritin: 27 ng/mL (ref 11–307)

## 2019-04-07 ENCOUNTER — Inpatient Hospital Stay: Payer: Commercial Managed Care - PPO

## 2019-04-10 ENCOUNTER — Inpatient Hospital Stay (HOSPITAL_BASED_OUTPATIENT_CLINIC_OR_DEPARTMENT_OTHER): Payer: Commercial Managed Care - PPO | Admitting: Oncology

## 2019-04-10 ENCOUNTER — Other Ambulatory Visit: Payer: Commercial Managed Care - PPO

## 2019-04-10 ENCOUNTER — Encounter: Payer: Self-pay | Admitting: Oncology

## 2019-04-10 DIAGNOSIS — R200 Anesthesia of skin: Secondary | ICD-10-CM | POA: Diagnosis not present

## 2019-04-10 DIAGNOSIS — D500 Iron deficiency anemia secondary to blood loss (chronic): Secondary | ICD-10-CM

## 2019-04-10 DIAGNOSIS — R202 Paresthesia of skin: Secondary | ICD-10-CM | POA: Diagnosis not present

## 2019-04-10 DIAGNOSIS — D509 Iron deficiency anemia, unspecified: Secondary | ICD-10-CM | POA: Diagnosis not present

## 2019-04-10 NOTE — Progress Notes (Signed)
HEMATOLOGY-ONCOLOGY TeleHEALTH VISIT PROGRESS NOTE  I connected with Lisa Walker on 04/10/19 at  9:30 AM EST by video enabled telemedicine visit and verified that I am speaking with the correct person using two identifiers. I discussed the limitations, risks, security and privacy concerns of performing an evaluation and management service by telemedicine and the availability of in-person appointments. I also discussed with the patient that there may be a patient responsible charge related to this service. The patient expressed understanding and agreed to proceed.   Other persons participating in the visit and their role in the encounter:  None  Patient's location: Home  Provider's location: office Chief Complaint: Iron deficiency anemia   INTERVAL HISTORY Lisa Walker is a 33 y.o. female who has above history reviewed by me today presents for follow up visit for management of iron deficiency anemia Problems and complaints are listed below:  Patient reports feeling well.  She continues to have numbness and tingling of her fingertips.  Otherwise no new complaints.     Review of Systems  Constitutional: Negative for appetite change, chills, fatigue and fever.  HENT:   Negative for hearing loss and voice change.   Eyes: Negative for eye problems.  Respiratory: Negative for chest tightness and cough.   Cardiovascular: Negative for chest pain.  Gastrointestinal: Negative for abdominal distention, abdominal pain and blood in stool.  Endocrine: Negative for hot flashes.  Genitourinary: Negative for difficulty urinating and frequency.   Musculoskeletal: Negative for arthralgias.  Skin: Negative for itching and rash.  Neurological: Negative for extremity weakness.  Hematological: Negative for adenopathy.  Psychiatric/Behavioral: Negative for confusion.    Past Medical History:  Diagnosis Date  . Anemia   . Anxiety   . GERD (gastroesophageal reflux disease)    H/O  . H/O cervical spine surgery     pt reports there is metal in neck  . PONV (postoperative nausea and vomiting)    Past Surgical History:  Procedure Laterality Date  . CESAREAN SECTION N/A 2011   performed in [place]  . CESAREAN SECTION N/A 09/03/2014   Procedure: repeat CESAREAN SECTION;  Surgeon: Aletha Halim, MD;  Location: [facility] ORS;  Service: Obstetrics;  Laterality: N/A;  . HYSTEROSCOPY W/D&C N/A 02/24/2019   Procedure: DILATATION AND CURETTAGE /HYSTEROSCOPY;  Surgeon: Ward, Honor Loh, MD;  Location: [facility] ORS;  Service: Gynecology;  Laterality: N/A;  . SKIN BIOPSY Right     Family History  Problem Relation Age of Onset  . Diabetes Father   . Diabetes Mother     Social History   Socioeconomic History  . Marital status: Married    Spouse name: Not on file  . Number of children: Not on file  . Years of education: Not on file  . Highest education level: Not on file  Occupational History  . Not on file  Tobacco Use  . Smoking status: Never Smoker  . Smokeless tobacco: Never Used  Substance and Sexual Activity  . Alcohol use: No    Alcohol/week: 0.0 standard drinks  . Drug use: No  . Sexual activity: Yes    Birth control/protection: None  Other Topics Concern  . Not on file  Social History Narrative  . Not on file   Social Determinants of Health   Financial Resource Strain:   . Difficulty of Paying Living Expenses: Not on file  Food Insecurity:   . Worried About Charity fundraiser in the Last Year: Not on file  . Ran Out of Food in  the Last Year: Not on file  Transportation Needs:   . Lack of Transportation (Medical): Not on file  . Lack of Transportation (Non-Medical): Not on file  Physical Activity:   . Days of Exercise per Week: Not on file  . Minutes of Exercise per Session: Not on file  Stress:   . Feeling of Stress : Not on file  Social Connections:   . Frequency of Communication with Friends and Family: Not on file  . Frequency of Social Gatherings with Friends and Family: Not on  file  . Attends Religious Services: Not on file  . Active Member of Clubs or Organizations: Not on file  . Attends Archivist Meetings: Not on file  . Marital Status: Not on file  Intimate Partner Violence:   . Fear of Current or Ex-Partner: Not on file  . Emotionally Abused: Not on file  . Physically Abused: Not on file  . Sexually Abused: Not on file    No current outpatient medications on file prior to visit.   No current facility-administered medications on file prior to visit.    Allergies  Allergen Reactions  . Contrast Media [Iodinated Diagnostic Agents] Shortness Of Breath  . Multihance [Gadobenate] Shortness Of Breath and Cough  . Pineapple Other (See Comments)    Upsets stomach        Observations/Objective: Today's Vitals   04/10/19 [id number]  PainSc: 0-No pain   There is no height or weight on file to calculate BMI.  Physical Exam  Constitutional: She is oriented to person, place, and time. No distress.  Neurological: She is alert and oriented to person, place, and time.  Psychiatric: Mood normal.    CBC    Component Value Date/Time   WBC 9.7 04/06/2019 1458   RBC 4.27 04/06/2019 1458   HGB 11.6 (L) 04/06/2019 1458   HCT 37.1 04/06/2019 1458   PLT 263 04/06/2019 1458   MCV 86.9 04/06/2019 1458   MCH 27.2 04/06/2019 1458   MCHC 31.3 04/06/2019 1458   RDW 12.7 04/06/2019 1458   LYMPHSABS 2.8 04/06/2019 1458   MONOABS 0.6 04/06/2019 1458   EOSABS 0.6 (H) 04/06/2019 1458   BASOSABS 0.0 04/06/2019 1458    CMP     Component Value Date/Time   NA 138 12/19/2018 1437   K 3.8 12/19/2018 1437   CL 107 12/19/2018 1437   CO2 22 12/19/2018 1437   GLUCOSE 97 12/19/2018 1437   BUN 11 12/19/2018 1437   CREATININE 0.73 12/19/2018 1437   CALCIUM 8.7 (L) 12/19/2018 1437   PROT 7.9 12/19/2018 1437   ALBUMIN 4.1 12/19/2018 1437   AST 13 (L) 12/19/2018 1437   ALT 14 12/19/2018 1437   ALKPHOS 64 12/19/2018 1437   BILITOT 0.4 12/19/2018 1437   GFRNONAA  >60 12/19/2018 1437   GFRAA >60 12/19/2018 1437     Assessment and Plan: 1. Iron deficiency anemia due to chronic blood loss   2. Numbness and tingling     Labs are reviewed and discussed with patient.  Stable hemoglobin at 11.6.  Iron panel showed improved iron saturation. Recommend patient to continue take oral ferrous sulfate 325 mg daily.  Patient gets medication over-the-counter. I also recommend patient to take vitamin C 500 mg daily to increase absorption deficiency.  Patient prefers to buy over-the-counter. No need for IV iron at this point. 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  Recommend patient to continue follow-up  with her neurologist. Recommend patient to continue oral vitamin B12 supplementation.  She gets it over-the-counter.  Follow Up Instructions: 6 months   I discussed the assessment and treatment plan with the patient. The patient was provided an opportunity to ask questions and all were answered. The patient agreed with the plan and demonstrated an understanding of the instructions.  The patient was advised to call back or seek an in-person evaluation if the symptoms worsen or if the condition fails to improve as anticipated.    Earlie Server, MD 04/10/2019 10:09 AM

## 2019-04-10 NOTE — Progress Notes (Signed)
Patient verified using two identifiers for virtual visit via telephone today.  Patient does not offer any problems today.  

## 2019-07-14 ENCOUNTER — Ambulatory Visit: Payer: Commercial Managed Care - PPO | Attending: Internal Medicine

## 2019-07-14 ENCOUNTER — Other Ambulatory Visit: Payer: Self-pay

## 2019-07-14 DIAGNOSIS — Z23 Encounter for immunization: Secondary | ICD-10-CM

## 2019-07-14 NOTE — Progress Notes (Signed)
   Covid-19 Vaccination Clinic  Name:  Lisa Walker    MRN: [id number] DOB: 1985-06-21  07/14/2019  [name] was observed post Covid-19 immunization for 15 minutes without incident. She was provided with Vaccine Information Sheet and instruction to access the V-Safe system.   [name] was instructed to call 911 with any severe reactions post vaccine: Marland Kitchen Difficulty breathing  . Swelling of face and throat  . A fast heartbeat  . A bad rash all over body  . Dizziness and weakness   Immunizations Administered    Name Date Dose VIS Date Route   Pfizer COVID-19 Vaccine 07/14/2019  2:53 PM 0.3 mL 04/07/2019 Intramuscular   Manufacturer: [place]   Lot: [id number]   [place]: [id number]

## 2019-07-31 DIAGNOSIS — F411 Generalized anxiety disorder: Secondary | ICD-10-CM | POA: Insufficient documentation

## 2019-08-08 ENCOUNTER — Ambulatory Visit: Payer: Commercial Managed Care - PPO | Attending: Internal Medicine

## 2019-08-08 DIAGNOSIS — Z23 Encounter for immunization: Secondary | ICD-10-CM

## 2019-08-08 NOTE — Progress Notes (Signed)
   Covid-19 Vaccination Clinic  Name:  Lisa Walker    MRN: [id number] DOB: Oct 25, 1985  08/08/2019  [name] was observed post Covid-19 immunization for 15 minutes without incident. She was provided with Vaccine Information Sheet and instruction to access the V-Safe system.   [name] was instructed to call 911 with any severe reactions post vaccine: Marland Kitchen Difficulty breathing  . Swelling of face and throat  . A fast heartbeat  . A bad rash all over body  . Dizziness and weakness   Immunizations Administered    Name Date Dose VIS Date Route   Pfizer COVID-19 Vaccine 08/08/2019  3:36 PM 0.3 mL 04/07/2019 Intramuscular   Manufacturer: [place]   Lot: [id number]   [place]: [id number]

## 2019-10-06 ENCOUNTER — Inpatient Hospital Stay: Payer: Commercial Managed Care - PPO | Attending: Oncology

## 2019-10-06 ENCOUNTER — Other Ambulatory Visit: Payer: Self-pay

## 2019-10-06 DIAGNOSIS — E559 Vitamin D deficiency, unspecified: Secondary | ICD-10-CM | POA: Insufficient documentation

## 2019-10-06 DIAGNOSIS — D500 Iron deficiency anemia secondary to blood loss (chronic): Secondary | ICD-10-CM | POA: Insufficient documentation

## 2019-10-06 DIAGNOSIS — R202 Paresthesia of skin: Secondary | ICD-10-CM

## 2019-10-06 LAB — CBC WITH DIFFERENTIAL/PLATELET
Abs Immature Granulocytes: 0.02 10*3/uL (ref 0.00–0.07)
Basophils Absolute: 0 10*3/uL (ref 0.0–0.1)
Basophils Relative: 0 %
Eosinophils Absolute: 0.2 10*3/uL (ref 0.0–0.5)
Eosinophils Relative: 3 %
HCT: 34.3 % — ABNORMAL LOW (ref 36.0–46.0)
Hemoglobin: 11.7 g/dL — ABNORMAL LOW (ref 12.0–15.0)
Immature Granulocytes: 0 %
Lymphocytes Relative: 30 %
Lymphs Abs: 2.2 10*3/uL (ref 0.7–4.0)
MCH: 28.4 pg (ref 26.0–34.0)
MCHC: 34.1 g/dL (ref 30.0–36.0)
MCV: 83.3 fL (ref 80.0–100.0)
Monocytes Absolute: 0.4 10*3/uL (ref 0.1–1.0)
Monocytes Relative: 6 %
Neutro Abs: 4.5 10*3/uL (ref 1.7–7.7)
Neutrophils Relative %: 61 %
Platelets: 254 10*3/uL (ref 150–400)
RBC: 4.12 MIL/uL (ref 3.87–5.11)
RDW: 12.5 % (ref 11.5–15.5)
WBC: 7.4 10*3/uL (ref 4.0–10.5)
nRBC: 0 % (ref 0.0–0.2)

## 2019-10-06 LAB — IRON AND TIBC
Iron: 68 ug/dL (ref 28–170)
Saturation Ratios: 21 % (ref 10.4–31.8)
TIBC: 326 ug/dL (ref 250–450)
UIBC: 258 ug/dL

## 2019-10-06 LAB — VITAMIN D 25 HYDROXY (VIT D DEFICIENCY, FRACTURES): Vit D, 25-Hydroxy: 26.58 ng/mL — ABNORMAL LOW (ref 30–100)

## 2019-10-06 LAB — VITAMIN B12: Vitamin B-12: 411 pg/mL (ref 180–914)

## 2019-10-06 LAB — FERRITIN: Ferritin: 24 ng/mL (ref 11–307)

## 2019-10-09 ENCOUNTER — Inpatient Hospital Stay: Payer: Commercial Managed Care - PPO | Admitting: Oncology

## 2019-10-10 ENCOUNTER — Inpatient Hospital Stay (HOSPITAL_BASED_OUTPATIENT_CLINIC_OR_DEPARTMENT_OTHER): Payer: Commercial Managed Care - PPO | Admitting: Oncology

## 2019-10-10 ENCOUNTER — Encounter: Payer: Self-pay | Admitting: Oncology

## 2019-10-10 DIAGNOSIS — E559 Vitamin D deficiency, unspecified: Secondary | ICD-10-CM

## 2019-10-10 DIAGNOSIS — D500 Iron deficiency anemia secondary to blood loss (chronic): Secondary | ICD-10-CM | POA: Diagnosis not present

## 2019-10-10 NOTE — Progress Notes (Signed)
Called contact number to update patient's chart.  Husband answered the phone and states he is not with the patient at this time and he is aware of virtual visit time.  States he will go online to update the patient's chart before the virtual visit.

## 2019-10-10 NOTE — Progress Notes (Signed)
HEMATOLOGY-ONCOLOGY TeleHEALTH VISIT PROGRESS NOTE  I connected with Lisa Walker on 10/10/19 at  2:45 PM EDT by video enabled telemedicine visit and verified that I am speaking with the correct person using two identifiers. I discussed the limitations, risks, security and privacy concerns of performing an evaluation and management service by telemedicine and the availability of in-person appointments. I also discussed with the patient that there may be a patient responsible charge related to this service. The patient expressed understanding and agreed to proceed.   Other persons participating in the visit and their role in the encounter:  Husband who participates the discussion.   Patient's location: Home  Provider's location: office Chief Complaint: Iron deficiency anemia   INTERVAL HISTORY Lisa Walker is a 34 y.o. female who has above history reviewed by me today presents for follow up visit for management of iron deficiency anemia Problems and complaints are listed below:  She reports feeling well.  Energy level is good.   Review of Systems  Constitutional: Negative for appetite change, chills, fatigue and fever.  HENT:   Negative for hearing loss and voice change.   Eyes: Negative for eye problems.  Respiratory: Negative for chest tightness and cough.   Cardiovascular: Negative for chest pain.  Gastrointestinal: Negative for abdominal distention, abdominal pain and blood in stool.  Endocrine: Negative for hot flashes.  Genitourinary: Negative for difficulty urinating and frequency.   Musculoskeletal: Negative for arthralgias.  Skin: Negative for itching and rash.  Neurological: Negative for extremity weakness.  Hematological: Negative for adenopathy.  Psychiatric/Behavioral: Negative for confusion.    Past Medical History:  Diagnosis Date  . Anemia   . Anxiety   . GERD (gastroesophageal reflux disease)    H/O  . H/O cervical spine surgery    pt reports there is metal in neck   . PONV (postoperative nausea and vomiting)    Past Surgical History:  Procedure Laterality Date  . CESAREAN SECTION N/A 2011   performed in [place]  . CESAREAN SECTION N/A 09/03/2014   Procedure: repeat CESAREAN SECTION;  Surgeon: Lisa Halim, MD;  Location: [facility];  Service: Obstetrics;  Laterality: N/A;  . HYSTEROSCOPY WITH D & C N/A 02/24/2019   Procedure: DILATATION AND CURETTAGE /HYSTEROSCOPY;  Surgeon: Ward, Honor Loh, MD;  Location: [facility];  Service: Gynecology;  Laterality: N/A;  . SKIN BIOPSY Right     Family History  Problem Relation Age of Onset  . Diabetes Father   . Diabetes Mother     Social History   Socioeconomic History  . Marital status: Married    Spouse name: Not on file  . Number of children: Not on file  . Years of education: Not on file  . Highest education level: Not on file  Occupational History  . Not on file  Tobacco Use  . Smoking status: Never Smoker  . Smokeless tobacco: Never Used  Vaping Use  . Vaping Use: Never used  Substance and Sexual Activity  . Alcohol use: No    Alcohol/week: 0.0 standard drinks  . Drug use: No  . Sexual activity: Yes    Birth control/protection: None  Other Topics Concern  . Not on file  Social History Narrative  . Not on file   Social Determinants of Health   Financial Resource Strain:   . Difficulty of Paying Living Expenses:   Food Insecurity:   . Worried About Charity fundraiser in the Last Year:   . Arboriculturist in  the Last Year:   Transportation Needs:   . Film/video editor (Medical):   Marland Kitchen Lack of Transportation (Non-Medical):   Physical Activity:   . Days of Exercise per Week:   . Minutes of Exercise per Session:   Stress:   . Feeling of Stress :   Social Connections:   . Frequency of Communication with Friends and Family:   . Frequency of Social Gatherings with Friends and Family:   . Attends Religious Services:   . Active Member of Clubs or Organizations:   . Attends Theatre manager Meetings:   Marland Kitchen Marital Status:   Intimate Partner Violence:   . Fear of Current or Ex-Partner:   . Emotionally Abused:   Marland Kitchen Physically Abused:   . Sexually Abused:     No current outpatient medications on file prior to visit.   No current facility-administered medications on file prior to visit.    Allergies  Allergen Reactions  . Contrast Media [Iodinated Diagnostic Agents] Shortness Of Breath  . Multihance [Gadobenate] Shortness Of Breath and Cough  . Pineapple Other (See Comments)    Upsets stomach        Observations/Objective: There were no vitals filed for this visit. There is no height or weight on file to calculate BMI.  Physical Exam Constitutional:      General: She is not in acute distress. Neurological:     Mental Status: She is alert.     CBC    Component Value Date/Time   WBC 7.4 10/06/2019 1328   RBC 4.12 10/06/2019 1328   HGB 11.7 (L) 10/06/2019 1328   HCT 34.3 (L) 10/06/2019 1328   PLT 254 10/06/2019 1328   MCV 83.3 10/06/2019 1328   MCH 28.4 10/06/2019 1328   MCHC 34.1 10/06/2019 1328   RDW 12.5 10/06/2019 1328   LYMPHSABS 2.2 10/06/2019 1328   MONOABS 0.4 10/06/2019 1328   EOSABS 0.2 10/06/2019 1328   BASOSABS 0.0 10/06/2019 1328    CMP     Component Value Date/Time   NA 138 12/19/2018 1437   K 3.8 12/19/2018 1437   CL 107 12/19/2018 1437   CO2 22 12/19/2018 1437   GLUCOSE 97 12/19/2018 1437   BUN 11 12/19/2018 1437   CREATININE 0.73 12/19/2018 1437   CALCIUM 8.7 (L) 12/19/2018 1437   PROT 7.9 12/19/2018 1437   ALBUMIN 4.1 12/19/2018 1437   AST 13 (L) 12/19/2018 1437   ALT 14 12/19/2018 1437   ALKPHOS 64 12/19/2018 1437   BILITOT 0.4 12/19/2018 1437   GFRNONAA >60 12/19/2018 1437   GFRAA >60 12/19/2018 1437     Assessment and Plan: 1. Iron deficiency anemia due to chronic blood loss   2. Vitamin D deficiency     #History of iron deficiency anemia Labs are reviewed and discussed with patient. Hemoglobin is stable  at 11.7, ferritin 24, iron saturation 21. Recommend patient to continue take oral iron supplementation.  Patient advised iron tablets over-the-counter.  #Vitamin D deficiency, Vitamin D level has improved to 26.58.  Patient follows up with primary care provider and has been started on vitamin D supplementation.  I will defer primary care provider for further monitoring her vitamin D level.  Vitamin B12 level is adequate.  Continue oral vitamin B12 supplementation.  She gets over-the-counter supplies.   Follow Up Instructions: 6 months-patient prefers to be seen via virtual visit in 6 months.  She and her husband understand the limitation of virtual visits.   I discussed  the assessment and treatment plan with the patient. The patient was provided an opportunity to ask questions and all were answered. The patient agreed with the plan and demonstrated an understanding of the instructions.  The patient was advised to call back or seek an in-person evaluation if the symptoms worsen or if the condition fails to improve as anticipated.    Earlie Server, MD 10/10/2019 9:12 PM

## 2020-04-08 ENCOUNTER — Inpatient Hospital Stay: Payer: Commercial Managed Care - PPO | Attending: Oncology

## 2020-04-08 ENCOUNTER — Other Ambulatory Visit: Payer: Self-pay

## 2020-04-08 DIAGNOSIS — D500 Iron deficiency anemia secondary to blood loss (chronic): Secondary | ICD-10-CM | POA: Insufficient documentation

## 2020-04-08 LAB — CBC WITH DIFFERENTIAL/PLATELET
Abs Immature Granulocytes: 0.03 10*3/uL (ref 0.00–0.07)
Basophils Absolute: 0.1 10*3/uL (ref 0.0–0.1)
Basophils Relative: 1 %
Eosinophils Absolute: 0.1 10*3/uL (ref 0.0–0.5)
Eosinophils Relative: 2 %
HCT: 36 % (ref 36.0–46.0)
Hemoglobin: 11.6 g/dL — ABNORMAL LOW (ref 12.0–15.0)
Immature Granulocytes: 0 %
Lymphocytes Relative: 34 %
Lymphs Abs: 2.5 10*3/uL (ref 0.7–4.0)
MCH: 27.3 pg (ref 26.0–34.0)
MCHC: 32.2 g/dL (ref 30.0–36.0)
MCV: 84.7 fL (ref 80.0–100.0)
Monocytes Absolute: 0.6 10*3/uL (ref 0.1–1.0)
Monocytes Relative: 8 %
Neutro Abs: 4 10*3/uL (ref 1.7–7.7)
Neutrophils Relative %: 55 %
Platelets: 259 10*3/uL (ref 150–400)
RBC: 4.25 MIL/uL (ref 3.87–5.11)
RDW: 12.7 % (ref 11.5–15.5)
WBC: 7.3 10*3/uL (ref 4.0–10.5)
nRBC: 0 % (ref 0.0–0.2)

## 2020-04-08 LAB — COMPREHENSIVE METABOLIC PANEL
ALT: 18 U/L (ref 0–44)
AST: 15 U/L (ref 15–41)
Albumin: 4.3 g/dL (ref 3.5–5.0)
Alkaline Phosphatase: 63 U/L (ref 38–126)
Anion gap: 9 (ref 5–15)
BUN: 10 mg/dL (ref 6–20)
CO2: 25 mmol/L (ref 22–32)
Calcium: 8.8 mg/dL — ABNORMAL LOW (ref 8.9–10.3)
Chloride: 102 mmol/L (ref 98–111)
Creatinine, Ser: 0.81 mg/dL (ref 0.44–1.00)
GFR, Estimated: >60 mL/min (ref >60)
Glucose, Bld: 110 mg/dL — ABNORMAL HIGH (ref 70–99)
Potassium: 3.9 mmol/L (ref 3.5–5.1)
Sodium: 136 mmol/L (ref 135–145)
Total Bilirubin: 0.3 mg/dL (ref 0.3–1.2)
Total Protein: 7.7 g/dL (ref 6.5–8.1)

## 2020-04-08 LAB — IRON AND TIBC
Iron: 44 ug/dL (ref 28–170)
Saturation Ratios: 12 % (ref 10.4–31.8)
TIBC: 356 ug/dL (ref 250–450)
UIBC: 312 ug/dL

## 2020-04-08 LAB — FERRITIN: Ferritin: 19 ng/mL (ref 11–307)

## 2020-04-08 LAB — VITAMIN B12: Vitamin B-12: 309 pg/mL (ref 180–914)

## 2020-04-10 ENCOUNTER — Inpatient Hospital Stay (HOSPITAL_BASED_OUTPATIENT_CLINIC_OR_DEPARTMENT_OTHER): Payer: Commercial Managed Care - PPO | Admitting: Oncology

## 2020-04-10 ENCOUNTER — Encounter: Payer: Self-pay | Admitting: Oncology

## 2020-04-10 DIAGNOSIS — D500 Iron deficiency anemia secondary to blood loss (chronic): Secondary | ICD-10-CM | POA: Diagnosis not present

## 2020-04-10 NOTE — Progress Notes (Signed)
Patient denies new problems/concerns today.   °

## 2020-04-10 NOTE — Progress Notes (Signed)
HEMATOLOGY-ONCOLOGY TeleHEALTH VISIT PROGRESS NOTE  I connected with Lisa Walker on 04/10/20 at  2:00 PM EST by video enabled telemedicine visit and verified that I am speaking with the correct person using two identifiers. I discussed the limitations, risks, security and privacy concerns of performing an evaluation and management service by telemedicine and the availability of in-person appointments. I also discussed with the patient that there may be a patient responsible charge related to this service. The patient expressed understanding and agreed to proceed.   Other persons participating in the visit and their role in the encounter:  Husband who participates the discussion.   Patient's location: Home  Provider's location: office Chief Complaint: Iron deficiency anemia   INTERVAL HISTORY Lisa Walker is a 34 y.o. female who has above history reviewed by me today presents for follow up visit for management of iron deficiency anemia Problems and complaints are listed below:  I attempted to connect the patient for visual enabled telehealth visit.  Due to the technical difficulties with video,  Patient was transitioned to audio only visit.  She reports feeling well. Norma regular menstrual period with normal flow. She takes oral iron supplementation intermittently. Denies any black or bloody stool.    Review of Systems  Constitutional: Negative for appetite change, chills, fatigue and fever.  HENT:   Negative for hearing loss and voice change.   Eyes: Negative for eye problems.  Respiratory: Negative for chest tightness and cough.   Cardiovascular: Negative for chest pain.  Gastrointestinal: Negative for abdominal distention, abdominal pain and blood in stool.  Endocrine: Negative for hot flashes.  Genitourinary: Negative for difficulty urinating and frequency.   Musculoskeletal: Negative for arthralgias.  Skin: Negative for itching and rash.  Neurological: Negative for extremity  weakness.  Hematological: Negative for adenopathy.  Psychiatric/Behavioral: Negative for confusion.    Past Medical History:  Diagnosis Date  . Anemia   . Anxiety   . GERD (gastroesophageal reflux disease)    H/O  . H/O cervical spine surgery    pt reports there is metal in neck  . PONV (postoperative nausea and vomiting)    Past Surgical History:  Procedure Laterality Date  . CESAREAN SECTION N/A 2011   performed in [place]  . CESAREAN SECTION N/A 09/03/2014   Procedure: repeat CESAREAN SECTION;  Surgeon: Aletha Halim, MD;  Location: [facility];  Service: Obstetrics;  Laterality: N/A;  . HYSTEROSCOPY WITH D & C N/A 02/24/2019   Procedure: DILATATION AND CURETTAGE /HYSTEROSCOPY;  Surgeon: Ward, Honor Loh, MD;  Location: [facility];  Service: Gynecology;  Laterality: N/A;  . SKIN BIOPSY Right     Family History  Problem Relation Age of Onset  . Diabetes Father   . Diabetes Mother     Social History   Socioeconomic History  . Marital status: Married    Spouse name: Not on file  . Number of children: Not on file  . Years of education: Not on file  . Highest education level: Not on file  Occupational History  . Not on file  Tobacco Use  . Smoking status: Never Smoker  . Smokeless tobacco: Never Used  Vaping Use  . Vaping Use: Never used  Substance and Sexual Activity  . Alcohol use: No    Alcohol/week: 0.0 standard drinks  . Drug use: No  . Sexual activity: Yes    Birth control/protection: None  Other Topics Concern  . Not on file  Social History Narrative  . Not on file  Social Determinants of Health   Financial Resource Strain: Not on file  Food Insecurity: Not on file  Transportation Needs: Not on file  Physical Activity: Not on file  Stress: Not on file  Social Connections: Not on file  Intimate Partner Violence: Not on file    No current outpatient medications on file prior to visit.   No current facility-administered medications on file prior to visit.     Allergies  Allergen Reactions  . Contrast Media [Iodinated Diagnostic Agents] Shortness Of Breath  . Multihance [Gadobenate] Shortness Of Breath and Cough  . Pineapple Other (See Comments)    Upsets stomach        Observations/Objective: Today's Vitals   04/10/20 1331  PainSc: 0-No pain   There is no height or weight on file to calculate BMI.  Physical Exam Constitutional:      General: She is not in acute distress. Neurological:     Mental Status: She is alert.     CBC    Component Value Date/Time   WBC 7.3 04/08/2020 1142   RBC 4.25 04/08/2020 1142   HGB 11.6 (L) 04/08/2020 1142   HCT 36.0 04/08/2020 1142   PLT 259 04/08/2020 1142   MCV 84.7 04/08/2020 1142   MCH 27.3 04/08/2020 1142   MCHC 32.2 04/08/2020 1142   RDW 12.7 04/08/2020 1142   LYMPHSABS 2.5 04/08/2020 1142   MONOABS 0.6 04/08/2020 1142   EOSABS 0.1 04/08/2020 1142   BASOSABS 0.1 04/08/2020 1142    CMP     Component Value Date/Time   NA 136 04/08/2020 1142   K 3.9 04/08/2020 1142   CL 102 04/08/2020 1142   CO2 25 04/08/2020 1142   GLUCOSE 110 (H) 04/08/2020 1142   BUN 10 04/08/2020 1142   CREATININE 0.81 04/08/2020 1142   CALCIUM 8.8 (L) 04/08/2020 1142   PROT 7.7 04/08/2020 1142   ALBUMIN 4.3 04/08/2020 1142   AST 15 04/08/2020 1142   ALT 18 04/08/2020 1142   ALKPHOS 63 04/08/2020 1142   BILITOT 0.3 04/08/2020 1142   GFRNONAA >60 04/08/2020 1142   GFRAA >60 12/19/2018 1437     Assessment and Plan: 1. Iron deficiency anemia due to chronic blood loss     # iron deficiency anemia Labs are reviewed and discussed with patient. Hemoglobin stable at 11.6, iron panel showed ferritin 19, iron saturation 12, within normal limits but decreased from 6 months ago.  Options of proceeding with IV venofer 200mg  x 1 vs wait and repeat blood work in 3 months.  Patient prefers to wait 3 months and repeat blood work Continue oral iron supplementation.   Etiology of iron deficiency anemia,  questionable blood loss from menses versus other sources of blood loss.  Discussed about need of further discussed with her primary care provider to see gastroenterology if she remains anemia despite having light menses.   Vitamin B12 level is normal.  continue oral vitamin B12 supplementation.  She gets over-the-counter supplies.   Follow Up Instructions: 6 months-patient prefers to be seen via virtual visit in 6 months.  She and her husband understand the limitation of virtual visits.   I discussed the assessment and treatment plan with the patient. The patient was provided an opportunity to ask questions and all were answered. The patient agreed with the plan and demonstrated an understanding of the instructions.  The patient was advised to call back or seek an in-person evaluation if the symptoms worsen or if the condition fails to  improve as anticipated.    Earlie Server, MD 04/10/2020 7:36 PM

## 2020-07-08 ENCOUNTER — Inpatient Hospital Stay: Payer: Commercial Managed Care - PPO | Attending: Oncology

## 2020-07-08 DIAGNOSIS — D500 Iron deficiency anemia secondary to blood loss (chronic): Secondary | ICD-10-CM | POA: Insufficient documentation

## 2020-07-08 DIAGNOSIS — B9681 Helicobacter pylori [H. pylori] as the cause of diseases classified elsewhere: Secondary | ICD-10-CM | POA: Insufficient documentation

## 2020-07-08 DIAGNOSIS — N920 Excessive and frequent menstruation with regular cycle: Secondary | ICD-10-CM | POA: Insufficient documentation

## 2020-07-08 DIAGNOSIS — E559 Vitamin D deficiency, unspecified: Secondary | ICD-10-CM | POA: Insufficient documentation

## 2020-07-08 DIAGNOSIS — K219 Gastro-esophageal reflux disease without esophagitis: Secondary | ICD-10-CM | POA: Insufficient documentation

## 2020-07-15 ENCOUNTER — Inpatient Hospital Stay: Payer: Commercial Managed Care - PPO | Admitting: Oncology

## 2020-07-17 ENCOUNTER — Encounter: Payer: Self-pay | Admitting: Oncology

## 2020-07-17 ENCOUNTER — Other Ambulatory Visit: Payer: Self-pay

## 2020-07-17 ENCOUNTER — Inpatient Hospital Stay (HOSPITAL_BASED_OUTPATIENT_CLINIC_OR_DEPARTMENT_OTHER): Payer: Commercial Managed Care - PPO | Admitting: Oncology

## 2020-07-17 VITALS — BP 100/70 | HR 80 | Temp 98.9°F | Resp 16 | Wt 157.0 lb

## 2020-07-17 DIAGNOSIS — K219 Gastro-esophageal reflux disease without esophagitis: Secondary | ICD-10-CM | POA: Diagnosis not present

## 2020-07-17 DIAGNOSIS — E559 Vitamin D deficiency, unspecified: Secondary | ICD-10-CM | POA: Diagnosis not present

## 2020-07-17 DIAGNOSIS — N920 Excessive and frequent menstruation with regular cycle: Secondary | ICD-10-CM | POA: Diagnosis not present

## 2020-07-17 DIAGNOSIS — B9681 Helicobacter pylori [H. pylori] as the cause of diseases classified elsewhere: Secondary | ICD-10-CM | POA: Diagnosis not present

## 2020-07-17 DIAGNOSIS — D500 Iron deficiency anemia secondary to blood loss (chronic): Secondary | ICD-10-CM | POA: Diagnosis present

## 2020-07-17 NOTE — Progress Notes (Signed)
[facility] Progress Note Patient Care Team: Vallery Sa as PCP - General (Physician Assistant) REASON FOR VISIT Follow up for treatment of iron deficiency anemia  HISTORY OF PRESENTING ILLNESS: Lisa Walker 35 y.o. [place] female with past medical history listed as below is referred by primary care provider to me for evaluation and management of iron deficiency anemia. Patient speaks English fluently and she tells me she is able to understand me well. Her husband came to the room and joined our conversation later. Patient reports having history of heavy menstrual period. She also had 2 termination of pregnancy. She was found to have iron deficiency anemia and has been on iron supplementation orally for the past year. However her iron store does not improve too much. Patient is still feels very fatigue and weakness. 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 She had the surgery for resection of the cyst and currently her neurologic symptoms are better. She is married and has 2 kids, youngest kid is 40 years old. She is not breast feeding. She is not a vegetarian and she consume red meat on  regular basis. Denies any family history of cancer. # patient went to [facility] for evaluation of a painful right upper extremity mass. S/p incision biopsy. 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  .  INTERVAL HISTORY Patient presents for management of history of iron deficiency anemia. Patient was seen by me 3 months ago.  She started oral iron supplementation since last visit and recently had blood work done at the primary care doctor's office.  She also has experienced epigastric discomfort 06/28/2020, ferritin 23, CBC showed hemoglobin 12.1. H. pylori IgG 1.06, urine culture positive for beta-hemolytic Streptococcus group B Patient was started on H. pylori treatments with  amoxicillin/clarithromycin/Flagyl/omeprazole. Patient reports that since she is taking her medication, she stopped oral iron supplementation at that point. Her menses are very light. She noticed that her stools were black.   Review of Systems  Constitutional: Negative for appetite change, chills, fatigue, fever and unexpected weight change.  HENT:   Negative for hearing loss, nosebleeds, sore throat and voice change.   Eyes: Negative for eye problems.  Respiratory: Negative for chest tightness, cough and hemoptysis.   Cardiovascular: Negative for chest pain.  Gastrointestinal: Negative for abdominal distention, abdominal pain, blood in stool and constipation.  Endocrine: Negative for hot flashes.  Genitourinary: Negative for difficulty urinating, dysuria, frequency and hematuria.   Musculoskeletal: Negative for arthralgias, flank pain and myalgias.  Skin: Negative for itching and rash.  Neurological: Negative for dizziness, extremity weakness, headaches, light-headedness and numbness.  Hematological: Negative for adenopathy.  Psychiatric/Behavioral: Negative for confusion and decreased concentration. The patient is not nervous/anxious.     MEDICAL HISTORY: Past Medical History:  Diagnosis Date  . Anemia   . Anxiety   . GERD (gastroesophageal reflux disease)    H/O  . H/O cervical spine surgery    pt reports there is metal in neck  . PONV (postoperative nausea and vomiting)     SURGICAL HISTORY: Past Surgical History:  Procedure Laterality Date  . CESAREAN SECTION N/A 2011   performed in [place]  . CESAREAN SECTION N/A 09/03/2014   Procedure: repeat CESAREAN SECTION;  Surgeon: Aletha Halim, MD;  Location: [facility] ORS;  Service: Obstetrics;  Laterality: N/A;  . HYSTEROSCOPY WITH D & C N/A 02/24/2019   Procedure: DILATATION AND CURETTAGE /  HYSTEROSCOPY;  Surgeon: Ward, Honor Loh, MD;  Location: [facility] ORS;  Service: Gynecology;  Laterality: N/A;  . SKIN BIOPSY Right     SOCIAL  HISTORY: Social History   Socioeconomic History  . Marital status: Married    Spouse name: Not on file  . Number of children: Not on file  . Years of education: Not on file  . Highest education level: Not on file  Occupational History  . Not on file  Tobacco Use  . Smoking status: Never Smoker  . Smokeless tobacco: Never Used  Vaping Use  . Vaping Use: Never used  Substance and Sexual Activity  . Alcohol use: No    Alcohol/week: 0.0 standard drinks  . Drug use: No  . Sexual activity: Yes    Birth control/protection: None  Other Topics Concern  . Not on file  Social History Narrative  . Not on file   Social Determinants of Health   Financial Resource Strain: Not on file  Food Insecurity: Not on file  Transportation Needs: Not on file  Physical Activity: Not on file  Stress: Not on file  Social Connections: Not on file  Intimate Partner Violence: Not on file    FAMILY HISTORY Family History  Problem Relation Age of Onset  . Diabetes Father   . Diabetes Mother     ALLERGIES:  is allergic to contrast media [iodinated diagnostic agents], multihance [gadobenate], and pineapple.  MEDICATIONS:  Current Outpatient Medications  Medication Sig Dispense Refill  . amoxicillin (AMOXIL) 500 MG tablet Take 1,000 mg by mouth 2 (two) times daily.    Marland Kitchen dicyclomine (BENTYL) 10 MG capsule Take by mouth.    Marland Kitchen omeprazole (PRILOSEC) 20 MG capsule Take by mouth.     No current facility-administered medications for this visit.    PHYSICAL EXAMINATION:  ECOG PERFORMANCE STATUS: 0 - Asymptomatic   Vitals:   07/17/20 1434  BP: 100/70  Pulse: 80  Resp: 16  Temp: 98.9 F (37.2 C)    Filed Weights   07/17/20 1434  Weight: 157 lb (71.2 kg)     Physical Exam Constitutional:      General: She is not in acute distress.    Appearance: She is not diaphoretic.  HENT:     Head: Normocephalic and atraumatic.     Nose: Nose normal.     Mouth/Throat:     Pharynx: No  oropharyngeal exudate.  Eyes:     General: No scleral icterus.    Pupils: Pupils are equal, round, and reactive to light.  Cardiovascular:     Rate and Rhythm: Normal rate and regular rhythm.     Heart sounds: No murmur heard.   Pulmonary:     Effort: Pulmonary effort is normal. No respiratory distress.     Breath sounds: No rales.  Chest:     Chest wall: No tenderness.  Abdominal:     General: There is no distension.     Palpations: Abdomen is soft.     Tenderness: There is no abdominal tenderness.  Musculoskeletal:        General: Normal range of motion.     Cervical back: Normal range of motion and neck supple.  Skin:    General: Skin is warm and dry.     Findings: No erythema.  Neurological:     Mental Status: She is alert and oriented to person, place, and time.     Cranial Nerves: No cranial nerve deficit.     Motor: No  abnormal muscle tone.     Coordination: Coordination normal.  Psychiatric:        Mood and Affect: Affect normal.     No visits with results within 1 Month(s) from this visit.  Latest known visit with results is:  Appointment on 04/08/2020  Component Date Value Ref Range Status  . Sodium 04/08/2020 136  135 - 145 mmol/L Final  . Potassium 04/08/2020 3.9  3.5 - 5.1 mmol/L Final  . Chloride 04/08/2020 102  98 - 111 mmol/L Final  . CO2 04/08/2020 25  22 - 32 mmol/L Final  . Glucose, Bld 04/08/2020 110* 70 - 99 mg/dL Final   Glucose reference range applies only to samples taken after fasting for at least 8 hours.  . BUN 04/08/2020 10  6 - 20 mg/dL Final  . Creatinine, Ser 04/08/2020 0.81  0.44 - 1.00 mg/dL Final  . Calcium 04/08/2020 8.8* 8.9 - 10.3 mg/dL Final  . Total Protein 04/08/2020 7.7  6.5 - 8.1 g/dL Final  . Albumin 04/08/2020 4.3  3.5 - 5.0 g/dL Final  . AST 04/08/2020 15  15 - 41 U/L Final  . ALT 04/08/2020 18  0 - 44 U/L Final  . Alkaline Phosphatase 04/08/2020 63  38 - 126 U/L Final  . Total Bilirubin 04/08/2020 0.3  0.3 - 1.2 mg/dL  Final  . GFR, Estimated 04/08/2020 >60  >60 mL/min Final   Comment: (NOTE) Calculated using the CKD-EPI Creatinine Equation (2021)   . Anion gap 04/08/2020 9  5 - 15 Final   Performed at [facility], [address] [place], [place] [address]  . Ferritin 04/08/2020 19  11 - 307 ng/mL Final   Performed at [facility], [address] [place], [place] [address]  . Vitamin B-12 04/08/2020 309  180 - 914 pg/mL Final   Comment: (NOTE) This assay is not validated for testing neonatal or myeloproliferative syndrome specimens for Vitamin B12 levels. Performed at [facility], [address] [place], [place] [address]   . Iron 04/08/2020 44  28 - 170 ug/dL Final  . TIBC 04/08/2020 356  250 - 450 ug/dL Final  . Saturation Ratios 04/08/2020 12  10.4 - 31.8 % Final  . UIBC 04/08/2020 312  ug/dL Final   Performed at [facility], [address], [place], [place] [address]  . WBC 04/08/2020 7.3  4.0 - 10.5 K/uL Final  . RBC 04/08/2020 4.25  3.87 - 5.11 MIL/uL Final  . Hemoglobin 04/08/2020 11.6* 12.0 - 15.0 g/dL Final  . HCT 04/08/2020 36.0  36.0 - 46.0 % Final  . MCV 04/08/2020 84.7  80.0 - 100.0 fL Final  . MCH 04/08/2020 27.3  26.0 - 34.0 pg Final  . MCHC 04/08/2020 32.2  30.0 - 36.0 g/dL Final  . RDW 04/08/2020 12.7  11.5 - 15.5 % Final  . Platelets 04/08/2020 259  150 - 400 K/uL Final  . nRBC 04/08/2020 0.0  0.0 - 0.2 % Final  . Neutrophils Relative % 04/08/2020 55  % Final  . Neutro Abs 04/08/2020 4.0  1.7 - 7.7 K/uL Final  . Lymphocytes Relative 04/08/2020 34  % Final  . Lymphs Abs 04/08/2020 2.5  0.7 - 4.0 K/uL Final  . Monocytes Relative 04/08/2020 8  % Final  . Monocytes Absolute 04/08/2020 0.6  0.1 - 1.0 K/uL Final  . Eosinophils Relative 04/08/2020 2  % Final  . Eosinophils Absolute 04/08/2020 0.1  0.0 - 0.5 K/uL Final  .  Basophils Relative 04/08/2020 1  % Final  . Basophils Absolute 04/08/2020 0.1  0.0 - 0.1 K/uL Final  . Immature  Granulocytes 04/08/2020 0  % Final  . Abs Immature Granulocytes 04/08/2020 0.03  0.00 - 0.07 K/uL Final   Performed at [facility], [address] [place], [place] [address]   Iron/TIBC/Ferritin/ %Sat    Component Value Date/Time   IRON 44 04/08/2020 1142   TIBC 356 04/08/2020 1142   FERRITIN 19 04/08/2020 1142   IRONPCTSAT 12 04/08/2020 1142   ASSESSMENT/PLAN 1. Iron deficiency anemia due to chronic blood loss   2. Vitamin D deficiency    # Iron deficiency Anemia:  I reviewed her blood work done at the primary care provider's office. Ferritin has slightly improved, hemoglobin has normalized. Hold off IV Venofer treatments at this point. Patient is currently off oral iron supplementation due to being on H. pylori treatments.  #H. pylori infection, patient is on antibiotic treatments per primary care provider.  Continue follow-up with primary care provider #Vitamin D deficiency, I will check vitamin D level at the next visit.  Continue supplementation  All questions were answered. The patient knows to call the clinic with any problems, questions or concerns. Repeat labs in 3 months, follow-up lab MD 6 months.Earlie Server, MD, PhD Hematology Oncology [facility] at [facility] Pager- [phone] 07/17/2020

## 2020-10-17 ENCOUNTER — Inpatient Hospital Stay: Payer: Commercial Managed Care - PPO | Attending: Oncology

## 2021-01-13 ENCOUNTER — Inpatient Hospital Stay: Payer: Commercial Managed Care - PPO | Attending: Oncology

## 2021-01-15 ENCOUNTER — Inpatient Hospital Stay: Payer: Commercial Managed Care - PPO | Admitting: Oncology

## 2021-01-27 ENCOUNTER — Encounter: Payer: Self-pay | Admitting: Oncology

## 2021-04-04 ENCOUNTER — Telehealth: Payer: Self-pay

## 2021-04-04 NOTE — Telephone Encounter (Signed)
-----   Message from Reeves [place] sent at 04/03/2021  3:19 PM EST ----- Regarding: RE: Reschedule Have left messages for pt to call to reschedule appts husband always answers wont let me talk to pt and tells me he will get her to call me back. 3 attempts have been made. ----- Message ----- From: Hewitt Blade Sent: 01/13/2021   1:42 PM EST To: Osie Cheeks, # Subject: Reschedule                                     Patient requesting call back to reschedule appts from today and Wednesday.

## 2021-05-29 ENCOUNTER — Encounter: Payer: Self-pay | Admitting: Internal Medicine

## 2021-05-29 NOTE — Telephone Encounter (Signed)
Spoke to pt husband. I have made appts to see NP. Agreeable to appts.

## 2021-06-03 ENCOUNTER — Encounter: Payer: Self-pay | Admitting: Nurse Practitioner

## 2021-06-03 ENCOUNTER — Other Ambulatory Visit: Payer: Self-pay

## 2021-06-03 ENCOUNTER — Inpatient Hospital Stay: Payer: Commercial Managed Care - PPO | Attending: Nurse Practitioner

## 2021-06-03 ENCOUNTER — Inpatient Hospital Stay (HOSPITAL_BASED_OUTPATIENT_CLINIC_OR_DEPARTMENT_OTHER): Payer: Commercial Managed Care - PPO | Admitting: Nurse Practitioner

## 2021-06-03 VITALS — BP 107/69 | HR 73 | Temp 98.0°F | Resp 18 | Wt 150.0 lb

## 2021-06-03 DIAGNOSIS — E559 Vitamin D deficiency, unspecified: Secondary | ICD-10-CM | POA: Diagnosis not present

## 2021-06-03 DIAGNOSIS — R5383 Other fatigue: Secondary | ICD-10-CM | POA: Diagnosis not present

## 2021-06-03 DIAGNOSIS — R531 Weakness: Secondary | ICD-10-CM | POA: Diagnosis not present

## 2021-06-03 DIAGNOSIS — N938 Other specified abnormal uterine and vaginal bleeding: Secondary | ICD-10-CM | POA: Insufficient documentation

## 2021-06-03 DIAGNOSIS — D500 Iron deficiency anemia secondary to blood loss (chronic): Secondary | ICD-10-CM | POA: Diagnosis not present

## 2021-06-03 DIAGNOSIS — F419 Anxiety disorder, unspecified: Secondary | ICD-10-CM | POA: Diagnosis not present

## 2021-06-03 DIAGNOSIS — R252 Cramp and spasm: Secondary | ICD-10-CM | POA: Diagnosis not present

## 2021-06-03 DIAGNOSIS — D509 Iron deficiency anemia, unspecified: Secondary | ICD-10-CM | POA: Insufficient documentation

## 2021-06-03 DIAGNOSIS — K219 Gastro-esophageal reflux disease without esophagitis: Secondary | ICD-10-CM | POA: Diagnosis not present

## 2021-06-03 LAB — CBC WITH DIFFERENTIAL/PLATELET
Abs Immature Granulocytes: 0.02 10*3/uL (ref 0.00–0.07)
Basophils Absolute: 0.1 10*3/uL (ref 0.0–0.1)
Basophils Relative: 1 %
Eosinophils Absolute: 0.1 10*3/uL (ref 0.0–0.5)
Eosinophils Relative: 2 %
HCT: 37.5 % (ref 36.0–46.0)
Hemoglobin: 12.3 g/dL (ref 12.0–15.0)
Immature Granulocytes: 0 %
Lymphocytes Relative: 33 %
Lymphs Abs: 2.4 10*3/uL (ref 0.7–4.0)
MCH: 27.4 pg (ref 26.0–34.0)
MCHC: 32.8 g/dL (ref 30.0–36.0)
MCV: 83.5 fL (ref 80.0–100.0)
Monocytes Absolute: 0.3 10*3/uL (ref 0.1–1.0)
Monocytes Relative: 5 %
Neutro Abs: 4.3 10*3/uL (ref 1.7–7.7)
Neutrophils Relative %: 59 %
Platelets: 298 10*3/uL (ref 150–400)
RBC: 4.49 MIL/uL (ref 3.87–5.11)
RDW: 12.7 % (ref 11.5–15.5)
WBC: 7.2 10*3/uL (ref 4.0–10.5)
nRBC: 0 % (ref 0.0–0.2)

## 2021-06-03 LAB — IRON AND TIBC
Iron: 60 ug/dL (ref 28–170)
Saturation Ratios: 16 % (ref 10.4–31.8)
TIBC: 379 ug/dL (ref 250–450)
UIBC: 319 ug/dL

## 2021-06-03 LAB — FERRITIN: Ferritin: 18 ng/mL (ref 11–307)

## 2021-06-03 LAB — VITAMIN D 25 HYDROXY (VIT D DEFICIENCY, FRACTURES): Vit D, 25-Hydroxy: 31.87 ng/mL (ref 30–100)

## 2021-06-03 NOTE — Progress Notes (Signed)
[facility] Progress Note  Patient Care Team: Pcp, No as PCP - General  REASON FOR VISIT Follow up for treatment of iron deficiency anemia  HISTORY OF PRESENTING ILLNESS: Lisa Walker 36 y.o. [place] female with past medical history listed as below is referred by primary care provider to me for evaluation and management of iron deficiency anemia. Patient speaks English fluently and she tells me she is able to understand me well. Her husband came to the room and joined our conversation later. Patient reports having history of heavy menstrual period. She also had 2 termination of pregnancy. She was found to have iron deficiency anemia and has been on iron supplementation orally for the past year. However her iron store does not improve too much. Patient is still feels very fatigue and weakness. 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 She had the surgery for resection of the cyst and currently her neurologic symptoms are better. She is married and has 2 kids, youngest kid is 19 years old. She is not breast feeding. She is not a vegetarian and she consume red meat on  regular basis. Denies any family history of cancer. # patient went to [facility] for evaluation of a painful right upper extremity mass. S/p incision biopsy. 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  .  INTERVAL HISTORY: Patient returns to clinic for follow up for iron deficiency anemia and vitamin d deficiency. She has completed h pylori treatment. No longer on oral iron. Periods are intermittently more moderate but generally not heavy. Endorses leg cramps, interfering with sleep. Fatigue. Denies black or bloody stools.     Review of Systems  Constitutional:  Negative for appetite change, chills, fatigue, fever and unexpected weight change.  HENT:   Negative for hearing loss, nosebleeds, sore throat and voice change.    Eyes:  Negative for eye problems.  Respiratory:  Negative for chest tightness, cough and hemoptysis.   Cardiovascular:  Negative for chest pain.  Gastrointestinal:  Negative for abdominal distention, abdominal pain, blood in stool and constipation.  Endocrine: Negative for hot flashes.  Genitourinary:  Negative for difficulty urinating, dysuria, frequency and hematuria.   Musculoskeletal:  Negative for arthralgias, flank pain and myalgias.  Skin:  Negative for itching and rash.  Neurological:  Negative for dizziness, extremity weakness, headaches, light-headedness and numbness.  Hematological:  Negative for adenopathy.  Psychiatric/Behavioral:  Negative for confusion and decreased concentration. The patient is not nervous/anxious.    MEDICAL HISTORY: Past Medical History:  Diagnosis Date   Anemia    Anxiety    GERD (gastroesophageal reflux disease)    H/O   H/O cervical spine surgery    pt reports there is metal in neck   PONV (postoperative nausea and vomiting)     SURGICAL HISTORY: Past Surgical History:  Procedure Laterality Date   CESAREAN SECTION N/A 2011   performed in [place]   CESAREAN SECTION N/A 09/03/2014   Procedure: repeat CESAREAN SECTION;  Surgeon: Aletha Halim, MD;  Location: [facility] ORS;  Service: Obstetrics;  Laterality: N/A;   HYSTEROSCOPY WITH D & C N/A 02/24/2019   Procedure: DILATATION AND CURETTAGE /HYSTEROSCOPY;  Surgeon: Ward, Honor Loh, MD;  Location: [facility] ORS;  Service: Gynecology;  Laterality: N/A;   SKIN BIOPSY Right     SOCIAL HISTORY: Social History   Socioeconomic History   Marital status: Married    Spouse name: Not on  file   Number of children: Not on file   Years of education: Not on file   Highest education level: Not on file  Occupational History   Not on file  Tobacco Use   Smoking status: Never   Smokeless tobacco: Never  Vaping Use   Vaping Use: Never used  Substance and Sexual Activity   Alcohol use: No    Alcohol/week: 0.0  standard drinks   Drug use: No   Sexual activity: Yes    Birth control/protection: None  Other Topics Concern   Not on file  Social History Narrative   Not on file   Social Determinants of Health   Financial Resource Strain: Not on file  Food Insecurity: Not on file  Transportation Needs: Not on file  Physical Activity: Not on file  Stress: Not on file  Social Connections: Not on file  Intimate Partner Violence: Not on file    FAMILY HISTORY Family History  Problem Relation Age of Onset   Diabetes Father    Diabetes Mother     ALLERGIES:  is allergic to contrast media [iodinated contrast media], multihance [gadobenate], and pineapple.  MEDICATIONS:  No current outpatient medications on file.   No current facility-administered medications for this visit.    PHYSICAL EXAMINATION:  ECOG PERFORMANCE STATUS: 0 - Asymptomatic Vitals:   06/03/21 1538  BP: 107/69  Pulse: 73  Resp: 18  Temp: 98 F (36.7 C)  SpO2: 100%    Filed Weights   06/03/21 1538  Weight: 150 lb (68 kg)     Physical Exam Constitutional:      General: She is not in acute distress.    Appearance: She is not diaphoretic.  HENT:     Mouth/Throat:     Pharynx: No oropharyngeal exudate.  Cardiovascular:     Rate and Rhythm: Normal rate and regular rhythm.  Pulmonary:     Effort: Pulmonary effort is normal. No respiratory distress.     Breath sounds: No rales.  Chest:     Chest wall: No tenderness.  Abdominal:     Palpations: Abdomen is soft.     Tenderness: There is no abdominal tenderness.  Skin:    Coloration: Skin is not pale.  Neurological:     Mental Status: She is alert and oriented to person, place, and time.     Cranial Nerves: No cranial nerve deficit.     Motor: No abnormal muscle tone.     Coordination: Coordination normal.  Psychiatric:        Mood and Affect: Mood and affect normal.        Behavior: Behavior normal.    Appointment on 06/03/2021  Component Date  Value Ref Range Status   WBC 06/03/2021 7.2  4.0 - 10.5 K/uL Final   RBC 06/03/2021 4.49  3.87 - 5.11 MIL/uL Final   Hemoglobin 06/03/2021 12.3  12.0 - 15.0 g/dL Final   HCT 06/03/2021 37.5  36.0 - 46.0 % Final   MCV 06/03/2021 83.5  80.0 - 100.0 fL Final   MCH 06/03/2021 27.4  26.0 - 34.0 pg Final   MCHC 06/03/2021 32.8  30.0 - 36.0 g/dL Final   RDW 06/03/2021 12.7  11.5 - 15.5 % Final   Platelets 06/03/2021 298  150 - 400 K/uL Final   nRBC 06/03/2021 0.0  0.0 - 0.2 % Final   Neutrophils Relative % 06/03/2021 59  % Final   Neutro Abs 06/03/2021 4.3  1.7 - 7.7 K/uL Final  Lymphocytes Relative 06/03/2021 33  % Final   Lymphs Abs 06/03/2021 2.4  0.7 - 4.0 K/uL Final   Monocytes Relative 06/03/2021 5  % Final   Monocytes Absolute 06/03/2021 0.3  0.1 - 1.0 K/uL Final   Eosinophils Relative 06/03/2021 2  % Final   Eosinophils Absolute 06/03/2021 0.1  0.0 - 0.5 K/uL Final   Basophils Relative 06/03/2021 1  % Final   Basophils Absolute 06/03/2021 0.1  0.0 - 0.1 K/uL Final   Immature Granulocytes 06/03/2021 0  % Final   Abs Immature Granulocytes 06/03/2021 0.02  0.00 - 0.07 K/uL Final   Performed at [facility], [place], [place] [address]   Iron/TIBC/Ferritin/ %Sat    Component Value Date/Time   IRON 44 04/08/2020 1142   TIBC 356 04/08/2020 1142   FERRITIN 19 04/08/2020 1142   IRONPCTSAT 12 04/08/2020 1142   ASSESSMENT/PLAN No diagnosis found.  # Iron deficiency Anemia: secondary to h pylori. Hemoglobin has normalized. Ferritin and iron studies are pending. Hold IV iron at this time.   #H. pylori infection- s/p antibiotic treatment. Symptomatically resolved.   #Vitamin D deficiency- labs pending. Continue supplementation  No iron today 3 mo- lab (cbc, ferritin, iron studies) Day to week later see Dr. Tasia Catchings +/- venofer. Visit can be virtual if patient prefers. - la  All questions were answered. The patient knows to call the clinic with any problems,  questions or concerns.  Beckey Rutter, DNP, AGNP-C [place] at [facility] [phone] (clinic) 06/03/2021

## 2021-09-01 ENCOUNTER — Ambulatory Visit: Payer: Commercial Managed Care - PPO

## 2021-09-01 ENCOUNTER — Other Ambulatory Visit: Payer: Self-pay

## 2021-09-01 ENCOUNTER — Inpatient Hospital Stay: Payer: Commercial Managed Care - PPO | Attending: Oncology

## 2021-09-01 ENCOUNTER — Ambulatory Visit: Payer: Commercial Managed Care - PPO | Admitting: Oncology

## 2021-09-01 ENCOUNTER — Other Ambulatory Visit: Payer: Self-pay | Admitting: Oncology

## 2021-09-01 DIAGNOSIS — D509 Iron deficiency anemia, unspecified: Secondary | ICD-10-CM | POA: Insufficient documentation

## 2021-09-01 DIAGNOSIS — R531 Weakness: Secondary | ICD-10-CM | POA: Diagnosis not present

## 2021-09-01 DIAGNOSIS — D500 Iron deficiency anemia secondary to blood loss (chronic): Secondary | ICD-10-CM

## 2021-09-01 DIAGNOSIS — R5383 Other fatigue: Secondary | ICD-10-CM | POA: Diagnosis not present

## 2021-09-01 LAB — CBC WITH DIFFERENTIAL/PLATELET
Abs Immature Granulocytes: 0.03 10*3/uL (ref 0.00–0.07)
Basophils Absolute: 0 10*3/uL (ref 0.0–0.1)
Basophils Relative: 0 %
Eosinophils Absolute: 0.2 10*3/uL (ref 0.0–0.5)
Eosinophils Relative: 3 %
HCT: 35.4 % — ABNORMAL LOW (ref 36.0–46.0)
Hemoglobin: 11.5 g/dL — ABNORMAL LOW (ref 12.0–15.0)
Immature Granulocytes: 0 %
Lymphocytes Relative: 30 %
Lymphs Abs: 2.1 10*3/uL (ref 0.7–4.0)
MCH: 27.3 pg (ref 26.0–34.0)
MCHC: 32.5 g/dL (ref 30.0–36.0)
MCV: 84.1 fL (ref 80.0–100.0)
Monocytes Absolute: 0.5 10*3/uL (ref 0.1–1.0)
Monocytes Relative: 7 %
Neutro Abs: 4.1 10*3/uL (ref 1.7–7.7)
Neutrophils Relative %: 60 %
Platelets: 261 10*3/uL (ref 150–400)
RBC: 4.21 MIL/uL (ref 3.87–5.11)
RDW: 13 % (ref 11.5–15.5)
WBC: 7 10*3/uL (ref 4.0–10.5)
nRBC: 0 % (ref 0.0–0.2)

## 2021-09-01 LAB — IRON AND TIBC
Iron: 47 ug/dL (ref 28–170)
Saturation Ratios: 12 % (ref 10.4–31.8)
TIBC: 395 ug/dL (ref 250–450)
UIBC: 348 ug/dL

## 2021-09-01 LAB — FERRITIN: Ferritin: 15 ng/mL (ref 11–307)

## 2021-09-08 ENCOUNTER — Encounter: Payer: Self-pay | Admitting: Oncology

## 2021-09-08 ENCOUNTER — Inpatient Hospital Stay: Payer: Commercial Managed Care - PPO

## 2021-09-08 ENCOUNTER — Inpatient Hospital Stay (HOSPITAL_BASED_OUTPATIENT_CLINIC_OR_DEPARTMENT_OTHER): Payer: Commercial Managed Care - PPO | Admitting: Oncology

## 2021-09-08 VITALS — BP 112/71 | HR 64 | Temp 97.6°F | Resp 18 | Wt 150.5 lb

## 2021-09-08 DIAGNOSIS — D500 Iron deficiency anemia secondary to blood loss (chronic): Secondary | ICD-10-CM

## 2021-09-08 DIAGNOSIS — D509 Iron deficiency anemia, unspecified: Secondary | ICD-10-CM | POA: Diagnosis not present

## 2021-09-08 LAB — PREGNANCY, URINE: Preg Test, Ur: NEGATIVE

## 2021-09-08 MED ORDER — SODIUM CHLORIDE 0.9 % IV SOLN: 200.0000 mg | Freq: Once | INTRAVENOUS | Status: DC

## 2021-09-08 MED ORDER — SODIUM CHLORIDE 0.9 % IV SOLN
Freq: Once | INTRAVENOUS | Status: AC
  Filled 2021-09-08: qty 250

## 2021-09-08 MED ORDER — IRON SUCROSE 20 MG/ML IV SOLN
200.0000 mg | Freq: Once | INTRAVENOUS | Status: AC
  Administered 2021-09-08: 200 mg via INTRAVENOUS
  Filled 2021-09-08: qty 10

## 2021-09-08 NOTE — Progress Notes (Signed)
Patient here for follow up. Pt reports that she has decreased appetite and constipation.  ?

## 2021-09-08 NOTE — Progress Notes (Signed)
[facility] Progress Note ?Patient Care Team: ?Pcp, No as PCP - General ?REASON FOR VISIT ?Follow up for treatment of iron deficiency anemia ? ?HISTORY OF PRESENTING ILLNESS: ?Lisa Walker 36 y.o. [place] female with past medical history listed as below is referred by primary care provider to me for evaluation and management of iron deficiency anemia. ?Patient speaks English fluently and she tells me she is able to understand me well. Her husband came to the room and joined our conversation later. Patient reports having history of heavy menstrual period. She also had 2 termination of pregnancy. She was found to have iron deficiency anemia and has been on iron supplementation orally for the past year. However her iron store does not improve too much. Patient is still feels very fatigue and weakness. 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 She had the surgery for resection of the cyst and currently her neurologic symptoms are better. ?She is married and has 2 kids, youngest kid is 23 years old. She is not breast feeding. She is not a vegetarian and she consume red meat on  regular basis. Denies any family history of cancer. ?# patient went to [facility] for evaluation of a painful right upper extremity mass. S/p incision biopsy. 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  . ? ?H. pylori IgG 1.06, urine culture positive for beta-hemolytic Streptococcus group B ?Patient was started on H. pylori treatments with amoxicillin/clarithromycin/Flagyl/omeprazole. ? ?INTERVAL HISTORY ?Patient presents for management of history of iron deficiency anemia. ?Patient reports that menstrual period is regular, usually last 7 to 9 days.  Spotting at the end   He feels tired. ?+ Neck pain, cerumen seen specialist for further evaluation. ? ? Review of Systems  ?Constitutional:  Positive for fatigue. Negative for appetite  change, chills, fever and unexpected weight change.  ?HENT:   Negative for hearing loss, nosebleeds, sore throat and voice change.   ?Eyes:  Negative for eye problems.  ?Respiratory:  Negative for chest tightness, cough and hemoptysis.   ?Cardiovascular:  Negative for chest pain.  ?Gastrointestinal:  Negative for abdominal distention, abdominal pain, blood in stool and constipation.  ?Endocrine: Negative for hot flashes.  ?Genitourinary:  Negative for difficulty urinating, dysuria, frequency and hematuria.   ?Musculoskeletal:  Negative for arthralgias, flank pain and myalgias.  ?Skin:  Negative for itching and rash.  ?Neurological:  Negative for dizziness, extremity weakness, headaches, light-headedness and numbness.  ?Hematological:  Negative for adenopathy.  ?Psychiatric/Behavioral:  Negative for confusion and decreased concentration. The patient is not nervous/anxious.   ? ?MEDICAL HISTORY: ?Past Medical History:  ?Diagnosis Date  ? Anemia   ? Anxiety   ? GERD (gastroesophageal reflux disease)   ? H/O  ? H/O cervical spine surgery   ? pt reports there is metal in neck  ? PONV (postoperative nausea and vomiting)   ? ? ?SURGICAL HISTORY: ?Past Surgical History:  ?Procedure Laterality Date  ? CESAREAN SECTION N/A 2011  ? performed in [place]  ? CESAREAN SECTION N/A 09/03/2014  ? Procedure: repeat CESAREAN SECTION;  Surgeon: Aletha Halim, MD;  Location: [facility] ORS;  Service: Obstetrics;  Laterality: N/A;  ? HYSTEROSCOPY WITH D & C N/A 02/24/2019  ? Procedure: DILATATION AND CURETTAGE /HYSTEROSCOPY;  Surgeon: Ward, Honor Loh, MD;  Location: [facility] ORS;  Service: Gynecology;  Laterality: N/A;  ? SKIN BIOPSY Right   ? ? ?SOCIAL HISTORY: ?Social History  ? ?  Socioeconomic History  ? Marital status: Married  ?  Spouse name: Not on file  ? Number of children: Not on file  ? Years of education: Not on file  ? Highest education level: Not on file  ?Occupational History  ? Not on file  ?Tobacco Use  ? Smoking status: Never  ?  Smokeless tobacco: Never  ?Vaping Use  ? Vaping Use: Never used  ?Substance and Sexual Activity  ? Alcohol use: No  ?  Alcohol/week: 0.0 standard drinks  ? Drug use: No  ? Sexual activity: Yes  ?  Birth control/protection: None  ?Other Topics Concern  ? Not on file  ?Social History Narrative  ? Not on file  ? ?Social Determinants of Health  ? ?Financial Resource Strain: Not on file  ?Food Insecurity: Not on file  ?Transportation Needs: Not on file  ?Physical Activity: Not on file  ?Stress: Not on file  ?Social Connections: Not on file  ?Intimate Partner Violence: Not on file  ? ? ?FAMILY HISTORY ?Family History  ?Problem Relation Age of Onset  ? Diabetes Father   ? Diabetes Mother   ? ? ?ALLERGIES:  is allergic to contrast media [iodinated contrast media], multihance [gadobenate], and pineapple. ? ?MEDICATIONS:  ?No current outpatient medications on file.  ? ?No current facility-administered medications for this visit.  ? ? ?PHYSICAL EXAMINATION: ? ?ECOG PERFORMANCE STATUS: 0 - Asymptomatic ? ? ?Vitals:  ? 09/08/21 1424  ?BP: 112/71  ?Pulse: 64  ?Resp: 18  ?Temp: 97.6 ?F (36.4 ?C)  ? ? ?Filed Weights  ? 09/08/21 1424  ?Weight: 150 lb 8 oz (68.3 kg)  ? ? ? ?Physical Exam ?Constitutional:   ?   General: She is not in acute distress. ?   Appearance: She is not diaphoretic.  ?HENT:  ?   Head: Normocephalic and atraumatic.  ?   Nose: Nose normal.  ?   Mouth/Throat:  ?   Pharynx: No oropharyngeal exudate.  ?Eyes:  ?   General: No scleral icterus. ?   Pupils: Pupils are equal, round, and reactive to light.  ?Cardiovascular:  ?   Rate and Rhythm: Normal rate and regular rhythm.  ?   Heart sounds: No murmur heard. ?Pulmonary:  ?   Effort: Pulmonary effort is normal. No respiratory distress.  ?   Breath sounds: No rales.  ?Chest:  ?   Chest wall: No tenderness.  ?Abdominal:  ?   General: There is no distension.  ?   Palpations: Abdomen is soft.  ?   Tenderness: There is no abdominal tenderness.  ?Musculoskeletal:     ?    General: Normal range of motion.  ?   Cervical back: Normal range of motion and neck supple.  ?Skin: ?   General: Skin is warm and dry.  ?   Findings: No erythema.  ?Neurological:  ?   Mental Status: She is alert and oriented to person, place, and time.  ?   Cranial Nerves: No cranial nerve deficit.  ?   Motor: No abnormal muscle tone.  ?   Coordination: Coordination normal.  ?Psychiatric:     ?   Mood and Affect: Affect normal.  ? ? ?Infusion on 09/08/2021  ?Component Date Value Ref Range Status  ? Preg Test, Ur 09/08/2021 NEGATIVE  NEGATIVE Final  ? Comment:        ?THE SENSITIVITY OF THIS ?METHODOLOGY IS >20 mIU/mL. ?Performed at [facility], [address], [place], [place] [address] ?  ?  Orders Only on 09/01/2021  ?Component Date Value Ref Range Status  ? Ferritin 09/01/2021 15  11 - 307 ng/mL Final  ? Performed at [facility], [address] [place], [place] [address]  ? Iron 09/01/2021 47  28 - 170 ug/dL Final  ? HEMOLYSIS AT THIS LEVEL MAY AFFECT RESULT  ? TIBC 09/01/2021 395  250 - 450 ug/dL Final  ? Saturation Ratios 09/01/2021 12  10.4 - 31.8 % Final  ? UIBC 09/01/2021 348  ug/dL Final  ? Performed at [facility], [address] [place], [place] [address]  ? WBC 09/01/2021 7.0  4.0 - 10.5 K/uL Final  ? RBC 09/01/2021 4.21  3.87 - 5.11 MIL/uL Final  ? Hemoglobin 09/01/2021 11.5 (L)  12.0 - 15.0 g/dL Final  ? HCT 09/01/2021 35.4 (L)  36.0 - 46.0 % Final  ? MCV 09/01/2021 84.1  80.0 - 100.0 fL Final  ? MCH 09/01/2021 27.3  26.0 - 34.0 pg Final  ? MCHC 09/01/2021 32.5  30.0 - 36.0 g/dL Final  ? RDW 09/01/2021 13.0  11.5 - 15.5 % Final  ? Platelets 09/01/2021 261  150 - 400 K/uL Final  ? nRBC 09/01/2021 0.0  0.0 - 0.2 % Final  ? Neutrophils Relative % 09/01/2021 60  % Final  ? Neutro Abs 09/01/2021 4.1  1.7 - 7.7 K/uL Final  ? Lymphocytes Relative 09/01/2021 30  % Final  ? Lymphs Abs 09/01/2021 2.1  0.7 - 4.0 K/uL Final  ? Monocytes Relative 09/01/2021 7  % Final  ? Monocytes  Absolute 09/01/2021 0.5  0.1 - 1.0 K/uL Final  ? Eosinophils Relative 09/01/2021 3  % Final  ? Eosinophils Absolute 09/01/2021 0.2  0.0 - 0.5 K/uL Final  ? Basophils Relative 09/01/2021 0  % Final  ? Basophils A

## 2022-03-10 ENCOUNTER — Inpatient Hospital Stay: Payer: Commercial Managed Care - PPO | Attending: Oncology

## 2022-03-10 DIAGNOSIS — D500 Iron deficiency anemia secondary to blood loss (chronic): Secondary | ICD-10-CM

## 2022-03-10 DIAGNOSIS — R202 Paresthesia of skin: Secondary | ICD-10-CM | POA: Diagnosis not present

## 2022-03-10 DIAGNOSIS — D509 Iron deficiency anemia, unspecified: Secondary | ICD-10-CM | POA: Insufficient documentation

## 2022-03-10 LAB — CBC WITH DIFFERENTIAL/PLATELET
Abs Immature Granulocytes: 0.02 10*3/uL (ref 0.00–0.07)
Basophils Absolute: 0 10*3/uL (ref 0.0–0.1)
Basophils Relative: 1 %
Eosinophils Absolute: 0.2 10*3/uL (ref 0.0–0.5)
Eosinophils Relative: 2 %
HCT: 36.6 % (ref 36.0–46.0)
Hemoglobin: 12 g/dL (ref 12.0–15.0)
Immature Granulocytes: 0 %
Lymphocytes Relative: 30 %
Lymphs Abs: 2.4 10*3/uL (ref 0.7–4.0)
MCH: 28 pg (ref 26.0–34.0)
MCHC: 32.8 g/dL (ref 30.0–36.0)
MCV: 85.5 fL (ref 80.0–100.0)
Monocytes Absolute: 0.6 10*3/uL (ref 0.1–1.0)
Monocytes Relative: 7 %
Neutro Abs: 4.8 10*3/uL (ref 1.7–7.7)
Neutrophils Relative %: 60 %
Platelets: 273 10*3/uL (ref 150–400)
RBC: 4.28 MIL/uL (ref 3.87–5.11)
RDW: 12.5 % (ref 11.5–15.5)
WBC: 8 10*3/uL (ref 4.0–10.5)
nRBC: 0 % (ref 0.0–0.2)

## 2022-03-10 LAB — IRON AND TIBC
Iron: 66 ug/dL (ref 28–170)
Saturation Ratios: 18 % (ref 10.4–31.8)
TIBC: 365 ug/dL (ref 250–450)
UIBC: 299 ug/dL

## 2022-03-10 LAB — FERRITIN: Ferritin: 24 ng/mL (ref 11–307)

## 2022-03-10 LAB — PREGNANCY, URINE: Preg Test, Ur: NEGATIVE

## 2022-03-11 MED FILL — Iron Sucrose Inj 20 MG/ML (Fe Equiv): INTRAVENOUS | Qty: 10 | Status: AC

## 2022-03-12 ENCOUNTER — Inpatient Hospital Stay: Payer: Commercial Managed Care - PPO

## 2022-03-12 ENCOUNTER — Inpatient Hospital Stay (HOSPITAL_BASED_OUTPATIENT_CLINIC_OR_DEPARTMENT_OTHER): Payer: Commercial Managed Care - PPO | Admitting: Oncology

## 2022-03-12 ENCOUNTER — Encounter: Payer: Self-pay | Admitting: Oncology

## 2022-03-12 VITALS — BP 107/70 | HR 70 | Temp 98.2°F | Wt 156.1 lb

## 2022-03-12 DIAGNOSIS — D500 Iron deficiency anemia secondary to blood loss (chronic): Secondary | ICD-10-CM

## 2022-03-12 DIAGNOSIS — R202 Paresthesia of skin: Secondary | ICD-10-CM

## 2022-03-12 DIAGNOSIS — D509 Iron deficiency anemia, unspecified: Secondary | ICD-10-CM | POA: Diagnosis not present

## 2022-03-12 NOTE — Assessment & Plan Note (Signed)
Labs are reviewed and discussed with patient. Both hemoglobin and iron panel are normal.  No need for IV Venofer.  `

## 2022-03-12 NOTE — Progress Notes (Signed)
[facility] Progress Note Patient Care Team: Pcp, No as PCP - General    ASSESSMENT & PLAN:   IDA (iron deficiency anemia) Labs are reviewed and discussed with patient. Both hemoglobin and iron panel are normal.  No need for IV Venofer.  `  Tingling in extremities She has history of cervical spine surgery. I recommend patient to further discuss with PCP for further evaluation to rule out radiculopathy  Orders Placed This Encounter  Procedures   CBC with Differential/Platelet    Standing Status:   Future    Standing Expiration Date:   03/13/2023   Iron and TIBC    Standing Status:   Future    Standing Expiration Date:   03/13/2023   Ferritin    Standing Status:   Future    Standing Expiration Date:   03/13/2023   Follow up  1 year.  All questions were answered. The patient knows to call the clinic with any problems, questions or concerns.  Earlie Server, MD, PhD [facility]ematology Oncology 03/12/2022    REASON FOR VISIT Follow up for treatment of iron deficiency anemia  HISTORY OF PRESENTING ILLNESS: Lisa Walker 36 y.o. [place] female with past medical history listed as below is referred by primary care provider to me for evaluation and management of iron deficiency anemia. Patient speaks English fluently and she tells me she is able to understand me well. Her husband came to the room and joined our conversation later. Patient reports having history of heavy menstrual period. She also had 2 termination of pregnancy. She was found to have iron deficiency anemia and has been on iron supplementation orally for the past year. However her iron store does not improve too much. Patient is still feels very fatigue and weakness. 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 She had the surgery for resection of the cyst and currently her neurologic symptoms are better. She is married  and has 2 kids, youngest kid is 40 years old. She is not breast feeding. She is not a vegetarian and she consume red meat on  regular basis. Denies any family history of cancer. # patient went to [facility] for evaluation of a painful right upper extremity mass. S/p incision biopsy. 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  .  H. pylori IgG 1.06, urine culture positive for beta-hemolytic Streptococcus group B Patient was started on H. pylori treatments with amoxicillin/clarithromycin/Flagyl/omeprazole.  INTERVAL HISTORY Patient presents for management of history of iron deficiency anemia. Patient reports that menstrual period is regular, very light.  + worse of bilateral finger tip numbness tingling. Decreased appetite and constipation. No weight loss   Review of Systems  Constitutional:  Negative for appetite change, chills, fatigue, fever and unexpected weight change.  HENT:   Negative for hearing loss, nosebleeds, sore throat and voice change.   Eyes:  Negative for eye problems.  Respiratory:  Negative for chest tightness, cough and hemoptysis.   Cardiovascular:  Negative for chest pain.  Gastrointestinal:  Positive for constipation. Negative for abdominal distention, abdominal pain and blood in stool.  Endocrine: Negative for hot flashes.  Genitourinary:  Negative for difficulty urinating, dysuria, frequency and hematuria.   Musculoskeletal:  Negative for arthralgias, flank pain and myalgias.  Skin:  Negative for itching and rash.  Neurological:  Positive for numbness. Negative for dizziness, extremity weakness, headaches and light-headedness.  Hematological:  Negative for  adenopathy.  Psychiatric/Behavioral:  Negative for confusion and decreased concentration. The patient is not nervous/anxious.     MEDICAL HISTORY: Past Medical History:  Diagnosis Date   Anemia    Anxiety    GERD (gastroesophageal reflux disease)    H/O   H/O cervical spine surgery    pt reports  there is metal in neck   PONV (postoperative nausea and vomiting)     SURGICAL HISTORY: Past Surgical History:  Procedure Laterality Date   CESAREAN SECTION N/A 2011   performed in [place]   CESAREAN SECTION N/A 09/03/2014   Procedure: repeat CESAREAN SECTION;  Surgeon: Aletha Halim, MD;  Location: [facility] ORS;  Service: Obstetrics;  Laterality: N/A;   HYSTEROSCOPY WITH D & C N/A 02/24/2019   Procedure: DILATATION AND CURETTAGE /HYSTEROSCOPY;  Surgeon: Ward, Honor Loh, MD;  Location: [facility] ORS;  Service: Gynecology;  Laterality: N/A;   SKIN BIOPSY Right     SOCIAL HISTORY: Social History   Socioeconomic History   Marital status: Married    Spouse name: Not on file   Number of children: Not on file   Years of education: Not on file   Highest education level: Not on file  Occupational History   Not on file  Tobacco Use   Smoking status: Never   Smokeless tobacco: Never  Vaping Use   Vaping Use: Never used  Substance and Sexual Activity   Alcohol use: No    Alcohol/week: 0.0 standard drinks of alcohol   Drug use: No   Sexual activity: Yes    Birth control/protection: None  Other Topics Concern   Not on file  Social History Narrative   Not on file   Social Determinants of Health   Financial Resource Strain: Not on file  Food Insecurity: Not on file  Transportation Needs: Not on file  Physical Activity: Not on file  Stress: Not on file  Social Connections: Not on file  Intimate Partner Violence: Not on file    FAMILY HISTORY Family History  Problem Relation Age of Onset   Diabetes Father    Diabetes Mother     ALLERGIES:  is allergic to contrast media [iodinated contrast media], multihance [gadobenate], and pineapple.  MEDICATIONS:  No current outpatient medications on file.   No current facility-administered medications for this visit.    PHYSICAL EXAMINATION:  ECOG PERFORMANCE STATUS: 0 - Asymptomatic   Vitals:   03/12/22 1407  BP: 107/70  Pulse:  70  Temp: 98.2 F (36.8 C)  SpO2: 100%    Filed Weights   03/12/22 1407  Weight: 156 lb 1.6 oz (70.8 kg)     Physical Exam Constitutional:      General: She is not in acute distress.    Appearance: She is not diaphoretic.  HENT:     Head: Normocephalic and atraumatic.     Nose: Nose normal.     Mouth/Throat:     Pharynx: No oropharyngeal exudate.  Eyes:     General: No scleral icterus.    Pupils: Pupils are equal, round, and reactive to light.  Cardiovascular:     Rate and Rhythm: Normal rate and regular rhythm.     Heart sounds: No murmur heard. Pulmonary:     Effort: Pulmonary effort is normal. No respiratory distress.     Breath sounds: No rales.  Chest:     Chest wall: No tenderness.  Abdominal:     General: There is no distension.     Palpations: Abdomen is  soft.     Tenderness: There is no abdominal tenderness.  Musculoskeletal:        General: Normal range of motion.     Cervical back: Normal range of motion and neck supple.  Skin:    General: Skin is warm and dry.     Findings: No erythema.  Neurological:     Mental Status: She is alert and oriented to person, place, and time. Mental status is at baseline.     Cranial Nerves: No cranial nerve deficit.     Motor: No abnormal muscle tone.     Coordination: Coordination normal.  Psychiatric:        Mood and Affect: Affect normal.     Labs    Latest Ref Rng & Units 03/10/2022    2:31 PM 09/01/2021    1:44 PM 06/03/2021    3:19 PM  CBC  WBC 4.0 - 10.5 K/uL 8.0  7.0  7.2   Hemoglobin 12.0 - 15.0 g/dL 12.0  11.5  12.3   Hematocrit 36.0 - 46.0 % 36.6  35.4  37.5   Platelets 150 - 400 K/uL 273  261  298       Latest Ref Rng & Units 04/08/2020   11:42 AM 12/19/2018    2:37 PM 03/31/2018    8:54 AM  CMP  Glucose 70 - 99 mg/dL 110  97  105   BUN 6 - 20 mg/dL '10  11  15   '$ Creatinine 0.44 - 1.00 mg/dL 0.81  0.73  0.63   Sodium 135 - 145 mmol/L 136  138  138   Potassium 3.5 - 5.1 mmol/L 3.9  3.8  4.1    Chloride 98 - 111 mmol/L 102  107  104   CO2 22 - 32 mmol/L '25  22  26   '$ Calcium 8.9 - 10.3 mg/dL 8.8  8.7  8.9   Total Protein 6.5 - 8.1 g/dL 7.7  7.9  7.8   Total Bilirubin 0.3 - 1.2 mg/dL 0.3  0.4  0.7   Alkaline Phos 38 - 126 U/L 63  64  67   AST 15 - 41 U/L '15  13  19   '$ ALT 0 - 44 U/L '18  14  18     '$ Iron/TIBC/Ferritin/ %Sat    Component Value Date/Time   IRON 66 03/10/2022 1431   TIBC 365 03/10/2022 1431   FERRITIN 24 03/10/2022 1431   IRONPCTSAT 18 03/10/2022 1431

## 2022-03-12 NOTE — Assessment & Plan Note (Signed)
She has history of cervical spine surgery. I recommend patient to further discuss with PCP for further evaluation to rule out radiculopathy

## 2022-08-07 ENCOUNTER — Other Ambulatory Visit: Payer: Self-pay | Admitting: Nurse Practitioner

## 2022-08-07 DIAGNOSIS — R1084 Generalized abdominal pain: Secondary | ICD-10-CM

## 2022-08-07 DIAGNOSIS — K5909 Other constipation: Secondary | ICD-10-CM

## 2022-08-20 ENCOUNTER — Other Ambulatory Visit: Payer: Self-pay | Admitting: Neurology

## 2022-08-20 DIAGNOSIS — M542 Cervicalgia: Secondary | ICD-10-CM

## 2022-08-20 DIAGNOSIS — M5481 Occipital neuralgia: Secondary | ICD-10-CM

## 2022-08-24 ENCOUNTER — Ambulatory Visit
Admission: RE | Admit: 2022-08-24 | Discharge: 2022-08-24 | Disposition: A | Discharge status: Home / Self Care | Payer: Commercial Managed Care - PPO | Source: Ambulatory Visit | Attending: Neurology | Admitting: Neurology

## 2022-08-24 DIAGNOSIS — M5481 Occipital neuralgia: Secondary | ICD-10-CM | POA: Diagnosis present

## 2022-08-24 DIAGNOSIS — M542 Cervicalgia: Secondary | ICD-10-CM | POA: Diagnosis present

## 2022-11-12 ENCOUNTER — Ambulatory Visit
Admission: RE | Admit: 2022-11-12 | Discharge: 2022-11-12 | Disposition: A | Discharge status: Home / Self Care | Payer: Commercial Managed Care - PPO | Source: Ambulatory Visit | Attending: Nurse Practitioner | Admitting: Nurse Practitioner

## 2022-11-12 DIAGNOSIS — R1084 Generalized abdominal pain: Secondary | ICD-10-CM

## 2022-11-12 DIAGNOSIS — K5909 Other constipation: Secondary | ICD-10-CM

## 2022-11-19 ENCOUNTER — Encounter: Payer: Self-pay | Admitting: Oncology

## 2022-11-19 ENCOUNTER — Ambulatory Visit: Payer: Commercial Managed Care - PPO

## 2022-11-19 DIAGNOSIS — K2950 Unspecified chronic gastritis without bleeding: Secondary | ICD-10-CM | POA: Diagnosis not present

## 2022-11-19 DIAGNOSIS — D122 Benign neoplasm of ascending colon: Secondary | ICD-10-CM | POA: Diagnosis not present

## 2022-11-19 DIAGNOSIS — K31A19 Gastric intestinal metaplasia without dysplasia, unspecified site: Secondary | ICD-10-CM

## 2022-11-19 DIAGNOSIS — K640 First degree hemorrhoids: Secondary | ICD-10-CM | POA: Diagnosis not present

## 2023-03-08 ENCOUNTER — Inpatient Hospital Stay: Payer: Commercial Managed Care - PPO | Attending: Oncology

## 2023-03-11 ENCOUNTER — Inpatient Hospital Stay: Payer: Commercial Managed Care - PPO | Admitting: Oncology

## 2023-03-11 ENCOUNTER — Encounter: Payer: Self-pay | Admitting: Oncology

## 2023-03-11 ENCOUNTER — Inpatient Hospital Stay: Payer: Commercial Managed Care - PPO

## 2023-05-06 ENCOUNTER — Other Ambulatory Visit: Payer: Self-pay

## 2023-05-06 DIAGNOSIS — D500 Iron deficiency anemia secondary to blood loss (chronic): Secondary | ICD-10-CM

## 2023-05-07 ENCOUNTER — Inpatient Hospital Stay: Payer: Commercial Managed Care - PPO | Attending: Oncology

## 2023-05-07 DIAGNOSIS — D509 Iron deficiency anemia, unspecified: Secondary | ICD-10-CM | POA: Insufficient documentation

## 2023-05-07 DIAGNOSIS — E538 Deficiency of other specified B group vitamins: Secondary | ICD-10-CM | POA: Insufficient documentation

## 2023-05-07 DIAGNOSIS — D500 Iron deficiency anemia secondary to blood loss (chronic): Secondary | ICD-10-CM

## 2023-05-07 LAB — CBC WITH DIFFERENTIAL (CANCER CENTER ONLY)
Abs Immature Granulocytes: 0.03 10*3/uL (ref 0.00–0.07)
Basophils Absolute: 0 10*3/uL (ref 0.0–0.1)
Basophils Relative: 1 %
Eosinophils Absolute: 0.1 10*3/uL (ref 0.0–0.5)
Eosinophils Relative: 2 %
HCT: 38.3 % (ref 36.0–46.0)
Hemoglobin: 12.4 g/dL (ref 12.0–15.0)
Immature Granulocytes: 1 %
Lymphocytes Relative: 36 %
Lymphs Abs: 2.1 10*3/uL (ref 0.7–4.0)
MCH: 27.4 pg (ref 26.0–34.0)
MCHC: 32.4 g/dL (ref 30.0–36.0)
MCV: 84.7 fL (ref 80.0–100.0)
Monocytes Absolute: 0.5 10*3/uL (ref 0.1–1.0)
Monocytes Relative: 8 %
Neutro Abs: 3 10*3/uL (ref 1.7–7.7)
Neutrophils Relative %: 52 %
Platelet Count: 255 10*3/uL (ref 150–400)
RBC: 4.52 MIL/uL (ref 3.87–5.11)
RDW: 12.4 % (ref 11.5–15.5)
WBC Count: 5.8 10*3/uL (ref 4.0–10.5)
nRBC: 0 % (ref 0.0–0.2)

## 2023-05-07 LAB — FERRITIN: Ferritin: 21 ng/mL (ref 11–307)

## 2023-05-07 LAB — IRON AND TIBC
Iron: 65 ug/dL (ref 28–170)
Saturation Ratios: 17 % (ref 10.4–31.8)
TIBC: 375 ug/dL (ref 250–450)
UIBC: 310 ug/dL

## 2023-05-11 ENCOUNTER — Inpatient Hospital Stay (HOSPITAL_BASED_OUTPATIENT_CLINIC_OR_DEPARTMENT_OTHER): Payer: Commercial Managed Care - PPO | Admitting: Oncology

## 2023-05-11 ENCOUNTER — Inpatient Hospital Stay: Payer: Commercial Managed Care - PPO

## 2023-05-11 ENCOUNTER — Encounter: Payer: Self-pay | Admitting: Oncology

## 2023-05-11 VITALS — BP 103/66 | HR 70 | Temp 97.8°F | Resp 18 | Wt 156.3 lb

## 2023-05-11 DIAGNOSIS — D500 Iron deficiency anemia secondary to blood loss (chronic): Secondary | ICD-10-CM

## 2023-05-11 DIAGNOSIS — E538 Deficiency of other specified B group vitamins: Secondary | ICD-10-CM

## 2023-05-11 MED ORDER — CYANOCOBALAMIN 1000 MCG/ML IJ SOLN
1000.0000 ug | Freq: Once | INTRAMUSCULAR | Status: AC
  Administered 2023-05-11: 1000 ug via INTRAMUSCULAR
  Filled 2023-05-11: qty 1

## 2023-05-11 NOTE — Progress Notes (Signed)
 [facility] Cancer Progress Note Patient Care Team: Sadie Manna, MD as PCP - General (Internal Medicine) Babara Call, MD as Consulting Physician (Oncology)    ASSESSMENT & PLAN:   IDA (iron  deficiency anemia) Labs are reviewed and discussed with patient.  Lab Results  Component Value Date   HGB 12.4 05/07/2023   TIBC 375 05/07/2023   IRONPCTSAT 17 05/07/2023   FERRITIN 21 05/07/2023    Both hemoglobin and iron  panel are normal.  No need for IV Venofer .  `  Vitamin B 12 deficiency B12 level is 258. Recommend B12 supplementation. She prefer B12 injection at our facility.  Recommend weekly B12 1000mcg IM x 4 followed by monthly B12 injections.   Orders Placed This Encounter  Procedures   CBC with Differential (Cancer Center Only)    Standing Status:   Future    Expected Date:   11/08/2023    Expiration Date:   05/10/2024   Iron  and TIBC    Standing Status:   Future    Expected Date:   11/08/2023    Expiration Date:   05/10/2024   Ferritin    Standing Status:   Future    Expected Date:   11/08/2023    Expiration Date:   05/10/2024   Vitamin B12    Standing Status:   Future    Expected Date:   11/08/2023    Expiration Date:   05/10/2024   Follow up 6 months All questions were answered. The patient knows to call the clinic with any problems, questions or concerns.  Call Babara, MD, PhD [facility] Hematology Oncology 40/14/2025    REASON FOR VISIT Follow up for treatment of iron  deficiency anemia  HISTORY OF PRESENTING ILLNESS: Kathlee Horst 38 y.o. [place] female with past medical history listed as below is referred by primary care provider to me for evaluation and management of iron  deficiency anemia. Patient speaks English fluently and she tells me she is able to understand me well. Her husband came to the room and joined our conversation later. Patient reports having history of heavy menstrual period. She also had 2 termination of pregnancy. She was found to  have iron  deficiency anemia and has been on iron  supplementation orally for the past year. However her iron  store does not improve too much. Patient is still feels very fatigue and weakness. 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 She had the surgery for resection of the cyst and currently her neurologic symptoms are better. She is married and has 2 kids, youngest kid is 50 years old. She is not breast feeding. She is not a vegetarian and she consume red meat on  regular basis. Denies any family history of cancer. # patient went to [facility] for evaluation of a painful right upper extremity mass. S/p incision biopsy. 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  .  H. pylori IgG 1.06, urine culture positive for beta-hemolytic Streptococcus group B Patient was started on H. pylori treatments with amoxicillin /clarithromycin/Flagyl/omeprazole.  INTERVAL HISTORY Patient presents for management of history of iron  deficiency anemia. Patient reports that menstrual period is regular, very light.  + worse of bilateral finger tip numbness tingling. Decreased appetite and constipation. No weight loss   Review of Systems  Constitutional:  Negative for appetite change, chills, fatigue, fever and unexpected weight change.  HENT:   Negative for hearing loss, nosebleeds, sore throat and voice change.  Eyes:  Negative for eye problems.  Respiratory:  Negative for chest tightness, cough and hemoptysis.   Cardiovascular:  Negative for chest pain.  Gastrointestinal:  Negative for abdominal distention, abdominal pain, blood in stool and constipation.  Endocrine: Negative for hot flashes.  Genitourinary:  Negative for difficulty urinating, dysuria, frequency and hematuria.   Musculoskeletal:  Negative for arthralgias, flank pain and myalgias.  Skin:  Negative for itching and rash.  Neurological:  Positive for  numbness. Negative for dizziness, extremity weakness, headaches and light-headedness.  Hematological:  Negative for adenopathy.  Psychiatric/Behavioral:  Negative for confusion and decreased concentration. The patient is not nervous/anxious.     MEDICAL HISTORY: Past Medical History:  Diagnosis Date   Anemia    Anxiety    GERD (gastroesophageal reflux disease)    H/O   H/O cervical spine surgery    pt reports there is metal in neck   PONV (postoperative nausea and vomiting)     SURGICAL HISTORY: Past Surgical History:  Procedure Laterality Date   CESAREAN SECTION N/A 2011   performed in [place]   CESAREAN SECTION N/A 09/03/2014   Procedure: repeat CESAREAN SECTION;  Surgeon: Bebe Furry, MD;  Location: [facility] ORS;  Service: Obstetrics;  Laterality: N/A;   HYSTEROSCOPY WITH D & C N/A 02/24/2019   Procedure: DILATATION AND CURETTAGE /HYSTEROSCOPY;  Surgeon: Ward, Mitzie BROCKS, MD;  Location: [facility] ORS;  Service: Gynecology;  Laterality: N/A;   SKIN BIOPSY Right     SOCIAL HISTORY: Social History   Socioeconomic History   Marital status: Married    Spouse name: Not on file   Number of children: Not on file   Years of education: Not on file   Highest education level: Not on file  Occupational History   Not on file  Tobacco Use   Smoking status: Never   Smokeless tobacco: Never  Vaping Use   Vaping status: Never Used  Substance and Sexual Activity   Alcohol use: No    Alcohol/week: 0.0 standard drinks of alcohol   Drug use: No   Sexual activity: Yes    Birth control/protection: None  Other Topics Concern   Not on file  Social History Narrative   Not on file   Social Drivers of Health   Financial Resource Strain: Not on file  Food Insecurity: Not on file  Transportation Needs: Not on file  Physical Activity: Not on file  Stress: Not on file  Social Connections: Not on file  Intimate Partner Violence: Not on file    FAMILY HISTORY Family History  Problem  Relation Age of Onset   Diabetes Father    Diabetes Mother     ALLERGIES:  is allergic to contrast media [iodinated contrast media], multihance  [gadobenate], and pineapple.  MEDICATIONS:  Current Outpatient Medications  Medication Sig Dispense Refill   Vitamin D , Ergocalciferol , (DRISDOL) 1.25 MG (50000 UNIT) CAPS capsule Take 50,000 Units by mouth every 7 (seven) days.     No current facility-administered medications for this visit.    PHYSICAL EXAMINATION:  ECOG PERFORMANCE STATUS: 0 - Asymptomatic   Vitals:   05/11/23 1419  BP: 103/66  Pulse: 70  Resp: 18  Temp: 97.8 F (36.6 C)    Filed Weights   05/11/23 1419  Weight: 156 lb 4.8 oz (70.9 kg)     Physical Exam Constitutional:      General: She is not in acute distress.    Appearance: She is not diaphoretic.  HENT:  Head: Normocephalic and atraumatic.  Eyes:     General: No scleral icterus. Cardiovascular:     Rate and Rhythm: Normal rate and regular rhythm.     Heart sounds: No murmur heard. Pulmonary:     Effort: Pulmonary effort is normal. No respiratory distress.  Chest:     Chest wall: No tenderness.  Abdominal:     General: Bowel sounds are normal. There is no distension.     Palpations: Abdomen is soft.  Musculoskeletal:        General: Normal range of motion.     Cervical back: Normal range of motion and neck supple.  Skin:    General: Skin is dry.     Findings: No erythema.  Neurological:     Mental Status: She is alert and oriented to person, place, and time. Mental status is at baseline.     Motor: No abnormal muscle tone.  Psychiatric:        Mood and Affect: Affect normal.     Labs    Latest Ref Rng & Units 05/07/2023    9:05 AM 03/10/2022    2:31 PM 09/01/2021    1:44 PM  CBC  WBC 4.0 - 10.5 K/uL 5.8  8.0  7.0   Hemoglobin 12.0 - 15.0 g/dL 87.5  87.9  88.4   Hematocrit 36.0 - 46.0 % 38.3  36.6  35.4   Platelets 150 - 400 K/uL 255  273  261       Latest Ref Rng & Units  04/08/2020   11:42 AM 12/19/2018    2:37 PM 03/31/2018    8:54 AM  CMP  Glucose 70 - 99 mg/dL 889  97  894   BUN 6 - 20 mg/dL 10  11  15    Creatinine 0.44 - 1.00 mg/dL 9.18  9.26  9.36   Sodium 135 - 145 mmol/L 136  138  138   Potassium 3.5 - 5.1 mmol/L 3.9  3.8  4.1   Chloride 98 - 111 mmol/L 102  107  104   CO2 22 - 32 mmol/L 25  22  26    Calcium 8.9 - 10.3 mg/dL 8.8  8.7  8.9   Total Protein 6.5 - 8.1 g/dL 7.7  7.9  7.8   Total Bilirubin 0.3 - 1.2 mg/dL 0.3  0.4  0.7   Alkaline Phos 38 - 126 U/L 63  64  67   AST 15 - 41 U/L 15  13  19    ALT 0 - 44 U/L 18  14  18      Iron /TIBC/Ferritin/ %Sat    Component Value Date/Time   IRON  65 05/07/2023 0905   TIBC 375 05/07/2023 0905   FERRITIN 21 05/07/2023 0905   IRONPCTSAT 17 05/07/2023 0905

## 2023-05-11 NOTE — Assessment & Plan Note (Addendum)
 B12 level is 258. Recommend B12 supplementation. She prefer B12 injection at our facility.  Recommend weekly B12 IM x 4 followed by monthly B12 injections.

## 2023-05-11 NOTE — Assessment & Plan Note (Signed)
 Labs are reviewed and discussed with patient.  Lab Results  Component Value Date   HGB 12.4 05/07/2023   TIBC 375 05/07/2023   IRONPCTSAT 17 05/07/2023   FERRITIN 21 05/07/2023    Both hemoglobin and iron panel are normal.  No need for IV Venofer

## 2023-05-11 NOTE — Progress Notes (Signed)
 Patient here for follow up. Pt reports feeling numbness and tingling to hands and feet.

## 2023-05-18 ENCOUNTER — Inpatient Hospital Stay: Payer: Commercial Managed Care - PPO

## 2023-05-18 DIAGNOSIS — E538 Deficiency of other specified B group vitamins: Secondary | ICD-10-CM | POA: Diagnosis not present

## 2023-05-18 DIAGNOSIS — D500 Iron deficiency anemia secondary to blood loss (chronic): Secondary | ICD-10-CM

## 2023-05-18 MED ORDER — CYANOCOBALAMIN 1000 MCG/ML IJ SOLN
1000.0000 ug | Freq: Once | INTRAMUSCULAR | Status: AC
  Administered 2023-05-18: 1000 ug via INTRAMUSCULAR
  Filled 2023-05-18: qty 1

## 2023-05-25 ENCOUNTER — Inpatient Hospital Stay: Payer: Commercial Managed Care - PPO

## 2023-05-25 DIAGNOSIS — E538 Deficiency of other specified B group vitamins: Secondary | ICD-10-CM | POA: Diagnosis not present

## 2023-05-25 DIAGNOSIS — D500 Iron deficiency anemia secondary to blood loss (chronic): Secondary | ICD-10-CM

## 2023-05-25 MED ORDER — CYANOCOBALAMIN 1000 MCG/ML IJ SOLN
1000.0000 ug | Freq: Once | INTRAMUSCULAR | Status: AC
  Administered 2023-05-25: 1000 ug via INTRAMUSCULAR
  Filled 2023-05-25: qty 1

## 2023-05-25 MED ORDER — CYANOCOBALAMIN 1000 MCG/ML IJ SOLN: 1000.0000 ug | Freq: Once | INTRAMUSCULAR | Status: DC

## 2023-06-01 ENCOUNTER — Inpatient Hospital Stay: Payer: Commercial Managed Care - PPO | Attending: Oncology

## 2023-06-01 DIAGNOSIS — E538 Deficiency of other specified B group vitamins: Secondary | ICD-10-CM | POA: Diagnosis present

## 2023-06-01 DIAGNOSIS — D500 Iron deficiency anemia secondary to blood loss (chronic): Secondary | ICD-10-CM

## 2023-06-01 MED ORDER — CYANOCOBALAMIN 1000 MCG/ML IJ SOLN
1000.0000 ug | Freq: Once | INTRAMUSCULAR | Status: AC
  Administered 2023-06-01: 1000 ug via INTRAMUSCULAR
  Filled 2023-06-01: qty 1

## 2023-06-29 ENCOUNTER — Inpatient Hospital Stay: Payer: Commercial Managed Care - PPO | Attending: Oncology

## 2023-07-30 ENCOUNTER — Inpatient Hospital Stay: Payer: Commercial Managed Care - PPO | Attending: Oncology

## 2023-08-30 ENCOUNTER — Inpatient Hospital Stay: Payer: Commercial Managed Care - PPO | Attending: Oncology

## 2023-09-30 ENCOUNTER — Inpatient Hospital Stay: Payer: Commercial Managed Care - PPO | Attending: Oncology

## 2023-09-30 DIAGNOSIS — D500 Iron deficiency anemia secondary to blood loss (chronic): Secondary | ICD-10-CM

## 2023-09-30 DIAGNOSIS — E538 Deficiency of other specified B group vitamins: Secondary | ICD-10-CM | POA: Diagnosis present

## 2023-09-30 MED ORDER — CYANOCOBALAMIN 1000 MCG/ML IJ SOLN
1000.0000 ug | Freq: Once | INTRAMUSCULAR | Status: AC
  Administered 2023-09-30: 1000 ug via INTRAMUSCULAR
  Filled 2023-09-30: qty 1

## 2023-11-01 ENCOUNTER — Inpatient Hospital Stay: Payer: Commercial Managed Care - PPO | Attending: Oncology

## 2023-11-01 DIAGNOSIS — D509 Iron deficiency anemia, unspecified: Secondary | ICD-10-CM | POA: Insufficient documentation

## 2023-11-01 DIAGNOSIS — E538 Deficiency of other specified B group vitamins: Secondary | ICD-10-CM | POA: Insufficient documentation

## 2023-11-09 ENCOUNTER — Other Ambulatory Visit: Payer: Self-pay | Admitting: Obstetrics

## 2023-11-09 DIAGNOSIS — Z1231 Encounter for screening mammogram for malignant neoplasm of breast: Secondary | ICD-10-CM

## 2023-11-12 ENCOUNTER — Inpatient Hospital Stay: Payer: Commercial Managed Care - PPO

## 2023-11-12 DIAGNOSIS — D509 Iron deficiency anemia, unspecified: Secondary | ICD-10-CM | POA: Diagnosis not present

## 2023-11-12 DIAGNOSIS — E538 Deficiency of other specified B group vitamins: Secondary | ICD-10-CM | POA: Diagnosis present

## 2023-11-12 DIAGNOSIS — D500 Iron deficiency anemia secondary to blood loss (chronic): Secondary | ICD-10-CM

## 2023-11-12 LAB — CBC WITH DIFFERENTIAL (CANCER CENTER ONLY)
Abs Immature Granulocytes: 0.02 K/uL (ref 0.00–0.07)
Basophils Absolute: 0 K/uL (ref 0.0–0.1)
Basophils Relative: 1 %
Eosinophils Absolute: 0.2 K/uL (ref 0.0–0.5)
Eosinophils Relative: 4 %
HCT: 37.7 % (ref 36.0–46.0)
Hemoglobin: 11.9 g/dL — ABNORMAL LOW (ref 12.0–15.0)
Immature Granulocytes: 0 %
Lymphocytes Relative: 32 %
Lymphs Abs: 1.9 K/uL (ref 0.7–4.0)
MCH: 26.7 pg (ref 26.0–34.0)
MCHC: 31.6 g/dL (ref 30.0–36.0)
MCV: 84.5 fL (ref 80.0–100.0)
Monocytes Absolute: 0.4 K/uL (ref 0.1–1.0)
Monocytes Relative: 7 %
Neutro Abs: 3.3 K/uL (ref 1.7–7.7)
Neutrophils Relative %: 56 %
Platelet Count: 249 K/uL (ref 150–400)
RBC: 4.46 MIL/uL (ref 3.87–5.11)
RDW: 12.7 % (ref 11.5–15.5)
WBC Count: 5.9 K/uL (ref 4.0–10.5)
nRBC: 0 % (ref 0.0–0.2)

## 2023-11-12 LAB — FERRITIN: Ferritin: 17 ng/mL (ref 11–307)

## 2023-11-12 LAB — IRON AND TIBC
Iron: 63 ug/dL (ref 28–170)
Saturation Ratios: 18 % (ref 10.4–31.8)
TIBC: 357 ug/dL (ref 250–450)
UIBC: 294 ug/dL

## 2023-11-12 LAB — VITAMIN B12: Vitamin B-12: 252 pg/mL (ref 180–914)

## 2023-11-16 ENCOUNTER — Inpatient Hospital Stay (HOSPITAL_BASED_OUTPATIENT_CLINIC_OR_DEPARTMENT_OTHER): Payer: Commercial Managed Care - PPO | Admitting: Oncology

## 2023-11-16 ENCOUNTER — Inpatient Hospital Stay: Payer: Commercial Managed Care - PPO

## 2023-11-16 ENCOUNTER — Encounter: Payer: Self-pay | Admitting: Oncology

## 2023-11-16 VITALS — BP 102/74 | HR 85 | Temp 97.8°F | Resp 18 | Wt 162.0 lb

## 2023-11-16 VITALS — BP 101/67 | HR 78

## 2023-11-16 DIAGNOSIS — D500 Iron deficiency anemia secondary to blood loss (chronic): Secondary | ICD-10-CM | POA: Diagnosis not present

## 2023-11-16 DIAGNOSIS — E538 Deficiency of other specified B group vitamins: Secondary | ICD-10-CM

## 2023-11-16 MED ORDER — SODIUM CHLORIDE 0.9% FLUSH
10.0000 mL | Freq: Once | INTRAVENOUS | Status: AC | PRN
  Administered 2023-11-16: 10 mL
  Filled 2023-11-16: qty 10

## 2023-11-16 MED ORDER — CYANOCOBALAMIN 1000 MCG/ML IJ SOLN
1000.0000 ug | Freq: Once | INTRAMUSCULAR | Status: AC
  Administered 2023-11-16: 1000 ug via INTRAMUSCULAR
  Filled 2023-11-16: qty 1

## 2023-11-16 MED ORDER — IRON SUCROSE 20 MG/ML IV SOLN
200.0000 mg | Freq: Once | INTRAVENOUS | Status: AC
Start: 2023-11-16 — End: 2023-11-16
  Administered 2023-11-16: 200 mg via INTRAVENOUS
  Filled 2023-11-16: qty 10

## 2023-11-16 NOTE — Assessment & Plan Note (Addendum)
 recommend monthly B12 injections.  B12 is chronically at low normal end.  Recommend her to try Sublingual B12 2500mcg daily.

## 2023-11-16 NOTE — Progress Notes (Signed)
 No energy, fatigue, getting tired very quickly, feels like it is def. Time to get another iron  infusion. She just doesn't understand why it keeps dropping so quickly.

## 2023-11-16 NOTE — Progress Notes (Signed)
 [facility] Cancer Progress Note Patient Care Team: Sadie Manna, MD as PCP - General (Internal Medicine) Babara Call, MD as Consulting Physician (Oncology)    ASSESSMENT & PLAN:   IDA (iron  deficiency anemia) Labs are reviewed and discussed with patient.  Lab Results  Component Value Date   HGB 11.9 (L) 11/12/2023   TIBC 357 11/12/2023   IRONPCTSAT 18 11/12/2023   FERRITIN 17 11/12/2023    Recommend IV venofer  200mg  x 1.  `  Vitamin B 12 deficiency recommend monthly B12 injections.  B12 is chronically at low normal end.  Recommend her to try Sublingual B12 2500mcg daily.   Orders Placed This Encounter  Procedures   CBC with Differential (Cancer Center Only)    Standing Status:   Future    Expected Date:   03/18/2024    Expiration Date:   06/16/2024   CMP (Cancer Center only)    Standing Status:   Future    Expected Date:   03/18/2024    Expiration Date:   06/16/2024   Ferritin    Standing Status:   Future    Expected Date:   03/18/2024    Expiration Date:   06/16/2024   Iron  and TIBC    Standing Status:   Future    Expected Date:   03/18/2024    Expiration Date:   06/16/2024   Retic Panel    Standing Status:   Future    Expected Date:   03/18/2024    Expiration Date:   06/16/2024   Vitamin B12    Standing Status:   Future    Expected Date:   03/18/2024    Expiration Date:   06/16/2024   Folate    Standing Status:   Future    Expected Date:   03/18/2024    Expiration Date:   06/16/2024   Follow up 4 months All questions were answered. The patient knows to call the clinic with any problems, questions or concerns.  Call Babara, MD, PhD [facility]ematology Oncology 11/16/2023    REASON FOR VISIT Follow up for treatment of iron  deficiency anemia  HISTORY OF PRESENTING ILLNESS: Lisa Walker 38 y.o. [place] female with past medical history listed as below is referred by primary care provider to me for evaluation and management of iron  deficiency  anemia. Patient speaks English fluently and she tells me she is able to understand me well. Her husband came to the room and joined our conversation later. Patient reports having history of heavy menstrual period. She also had 2 termination of pregnancy. She was found to have iron  deficiency anemia and has been on iron  supplementation orally for the past year. However her iron  store does not improve too much. Patient is still feels very fatigue and weakness. 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 She had the surgery for resection of the cyst and currently her neurologic symptoms are better. She is married and has 2 kids, youngest kid is 34 years old. She is not breast feeding. She is not a vegetarian and she consume red meat on  regular basis. Denies any family history of cancer. # patient went to [facility] for evaluation of a painful right upper extremity mass. S/p incision biopsy. 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  .  H. pylori IgG 1.06, urine culture positive for beta-hemolytic Streptococcus group B Patient was started on H. pylori treatments  with amoxicillin /clarithromycin/Flagyl/omeprazole.  INTERVAL HISTORY Patient presents for management of history of iron  deficiency anemia. She feels more tired.    Review of Systems  Constitutional:  Positive for fatigue. Negative for appetite change, chills, fever and unexpected weight change.  HENT:   Negative for hearing loss, nosebleeds, sore throat and voice change.   Eyes:  Negative for eye problems.  Respiratory:  Negative for chest tightness, cough and hemoptysis.   Cardiovascular:  Negative for chest pain.  Gastrointestinal:  Negative for abdominal distention, abdominal pain, blood in stool and constipation.  Endocrine: Negative for hot flashes.  Genitourinary:  Negative for difficulty urinating, dysuria, frequency and hematuria.    Musculoskeletal:  Negative for arthralgias, flank pain and myalgias.  Skin:  Negative for itching and rash.  Neurological:  Negative for dizziness, extremity weakness, headaches, light-headedness and numbness.  Hematological:  Negative for adenopathy.  Psychiatric/Behavioral:  Negative for confusion and decreased concentration. The patient is not nervous/anxious.     MEDICAL HISTORY: Past Medical History:  Diagnosis Date   Anemia    Anxiety    GERD (gastroesophageal reflux disease)    H/O   H/O cervical spine surgery    pt reports there is metal in neck   PONV (postoperative nausea and vomiting)     SURGICAL HISTORY: Past Surgical History:  Procedure Laterality Date   CESAREAN SECTION N/A 2011   performed in [place]   CESAREAN SECTION N/A 09/03/2014   Procedure: repeat CESAREAN SECTION;  Surgeon: Bebe Furry, MD;  Location: [facility] ORS;  Service: Obstetrics;  Laterality: N/A;   HYSTEROSCOPY WITH D & C N/A 02/24/2019   Procedure: DILATATION AND CURETTAGE /HYSTEROSCOPY;  Surgeon: Ward, Mitzie BROCKS, MD;  Location: [facility] ORS;  Service: Gynecology;  Laterality: N/A;   SKIN BIOPSY Right     SOCIAL HISTORY: Social History   Socioeconomic History   Marital status: Married    Spouse name: Not on file   Number of children: Not on file   Years of education: Not on file   Highest education level: Not on file  Occupational History   Not on file  Tobacco Use   Smoking status: Never   Smokeless tobacco: Never  Vaping Use   Vaping status: Never Used  Substance and Sexual Activity   Alcohol use: No    Alcohol/week: 0.0 standard drinks of alcohol   Drug use: No   Sexual activity: Yes    Birth control/protection: None  Other Topics Concern   Not on file  Social History Narrative   Not on file   Social Drivers of Health   Financial Resource Strain: Low Risk  (10/05/2023)   Received from [facility]   Overall Financial Resource Strain (CARDIA)    Difficulty of  Paying Living Expenses: Not hard at all  Food Insecurity: No Food Insecurity (10/05/2023)   Received from [facility]   Hunger Vital Sign    Within the past 12 months, you worried that your food would run out before you got the money to buy more.: Never true    Within the past 12 months, the food you bought just didn't last and you didn't have money to get more.: Never true  Transportation Needs: No Transportation Needs (10/05/2023)   Received from [facility] - Transportation    In the past 12 months, has lack of transportation kept you from medical appointments or from getting medications?: No    Lack of Transportation (Non-Medical):  No  Physical Activity: Not on file  Stress: Not on file  Social Connections: Not on file  Intimate Partner Violence: Not on file    FAMILY HISTORY Family History  Problem Relation Age of Onset   Diabetes Father    Diabetes Mother     ALLERGIES:  is allergic to contrast media [iodinated contrast media], multihance  [gadobenate], and pineapple.  MEDICATIONS:  Current Outpatient Medications  Medication Sig Dispense Refill   Vitamin D , Ergocalciferol , (DRISDOL) 1.25 MG (50000 UNIT) CAPS capsule Take 50,000 Units by mouth every 7 (seven) days.     No current facility-administered medications for this visit.    PHYSICAL EXAMINATION:  ECOG PERFORMANCE STATUS: 0 - Asymptomatic   Vitals:   11/16/23 1455  BP: 102/74  Pulse: 85  Resp: 18  Temp: 97.8 F (36.6 C)  SpO2: 99%    Filed Weights   11/16/23 1455  Weight: 162 lb (73.5 kg)     Physical Exam Constitutional:      General: She is not in acute distress.    Appearance: She is not diaphoretic.  HENT:     Head: Normocephalic and atraumatic.  Eyes:     General: No scleral icterus. Cardiovascular:     Rate and Rhythm: Normal rate and regular rhythm.     Heart sounds: No murmur heard. Pulmonary:     Effort: Pulmonary effort is normal. No  respiratory distress.  Abdominal:     General: Bowel sounds are normal. There is no distension.     Palpations: Abdomen is soft.  Musculoskeletal:        General: Normal range of motion.     Cervical back: Normal range of motion and neck supple.  Skin:    General: Skin is dry.     Findings: No erythema.  Neurological:     Mental Status: She is alert and oriented to person, place, and time. Mental status is at baseline.     Motor: No abnormal muscle tone.  Psychiatric:        Mood and Affect: Affect normal.     Labs    Latest Ref Rng & Units 11/12/2023    9:04 AM 05/07/2023    9:05 AM 03/10/2022    2:31 PM  CBC  WBC 4.0 - 10.5 K/uL 5.9  5.8  8.0   Hemoglobin 12.0 - 15.0 g/dL 88.0  87.5  87.9   Hematocrit 36.0 - 46.0 % 37.7  38.3  36.6   Platelets 150 - 400 K/uL 249  255  273       Latest Ref Rng & Units 04/08/2020   11:42 AM 12/19/2018    2:37 PM 03/31/2018    8:54 AM  CMP  Glucose 70 - 99 mg/dL 889  97  894   BUN 6 - 20 mg/dL 10  11  15    Creatinine 0.44 - 1.00 mg/dL 9.18  9.26  9.36   Sodium 135 - 145 mmol/L 136  138  138   Potassium 3.5 - 5.1 mmol/L 3.9  3.8  4.1   Chloride 98 - 111 mmol/L 102  107  104   CO2 22 - 32 mmol/L 25  22  26    Calcium 8.9 - 10.3 mg/dL 8.8  8.7  8.9   Total Protein 6.5 - 8.1 g/dL 7.7  7.9  7.8   Total Bilirubin 0.3 - 1.2 mg/dL 0.3  0.4  0.7   Alkaline Phos 38 - 126 U/L 63  64  67  AST 15 - 41 U/L 15  13  19    ALT 0 - 44 U/L 18  14  18      Iron /TIBC/Ferritin/ %Sat    Component Value Date/Time   IRON  63 11/12/2023 0904   TIBC 357 11/12/2023 0904   FERRITIN 17 11/12/2023 0904   IRONPCTSAT 18 11/12/2023 0904

## 2023-11-16 NOTE — Assessment & Plan Note (Addendum)
 Labs are reviewed and discussed with patient.  Lab Results  Component Value Date   HGB 11.9 (L) 11/12/2023   TIBC 357 11/12/2023   IRONPCTSAT 18 11/12/2023   FERRITIN 17 11/12/2023    Recommend IV venofer  200mg  x 1.  `

## 2023-12-06 ENCOUNTER — Encounter

## 2023-12-10 ENCOUNTER — Encounter

## 2023-12-17 ENCOUNTER — Inpatient Hospital Stay: Attending: Oncology

## 2023-12-28 ENCOUNTER — Encounter

## 2024-01-17 ENCOUNTER — Inpatient Hospital Stay: Attending: Oncology

## 2024-01-17 DIAGNOSIS — E538 Deficiency of other specified B group vitamins: Secondary | ICD-10-CM | POA: Insufficient documentation

## 2024-01-18 ENCOUNTER — Inpatient Hospital Stay

## 2024-01-18 DIAGNOSIS — E538 Deficiency of other specified B group vitamins: Secondary | ICD-10-CM | POA: Diagnosis present

## 2024-01-18 DIAGNOSIS — D500 Iron deficiency anemia secondary to blood loss (chronic): Secondary | ICD-10-CM

## 2024-01-18 MED ORDER — CYANOCOBALAMIN 1000 MCG/ML IJ SOLN
1000.0000 ug | Freq: Once | INTRAMUSCULAR | Status: AC
  Administered 2024-01-18: 1000 ug via INTRAMUSCULAR
  Filled 2024-01-18: qty 1

## 2024-01-21 ENCOUNTER — Ambulatory Visit
Admission: RE | Admit: 2024-01-21 | Discharge: 2024-01-21 | Disposition: A | Discharge status: Home / Self Care | Source: Ambulatory Visit | Attending: Obstetrics | Admitting: Obstetrics

## 2024-01-21 DIAGNOSIS — Z1231 Encounter for screening mammogram for malignant neoplasm of breast: Secondary | ICD-10-CM | POA: Diagnosis present

## 2024-02-16 ENCOUNTER — Inpatient Hospital Stay: Attending: Oncology

## 2024-02-16 ENCOUNTER — Inpatient Hospital Stay

## 2024-02-16 DIAGNOSIS — D500 Iron deficiency anemia secondary to blood loss (chronic): Secondary | ICD-10-CM

## 2024-02-16 DIAGNOSIS — E538 Deficiency of other specified B group vitamins: Secondary | ICD-10-CM | POA: Diagnosis present

## 2024-02-16 MED ORDER — CYANOCOBALAMIN 1000 MCG/ML IJ SOLN
1000.0000 ug | Freq: Once | INTRAMUSCULAR | Status: AC
  Administered 2024-02-16: 1000 ug via INTRAMUSCULAR
  Filled 2024-02-16: qty 1

## 2024-03-17 ENCOUNTER — Inpatient Hospital Stay

## 2024-03-20 ENCOUNTER — Inpatient Hospital Stay: Attending: Oncology

## 2024-03-20 DIAGNOSIS — D509 Iron deficiency anemia, unspecified: Secondary | ICD-10-CM | POA: Insufficient documentation

## 2024-03-20 DIAGNOSIS — E538 Deficiency of other specified B group vitamins: Secondary | ICD-10-CM | POA: Diagnosis present

## 2024-03-20 DIAGNOSIS — D500 Iron deficiency anemia secondary to blood loss (chronic): Secondary | ICD-10-CM

## 2024-03-20 LAB — CBC WITH DIFFERENTIAL (CANCER CENTER ONLY)
Abs Immature Granulocytes: 0.05 K/uL (ref 0.00–0.07)
Basophils Absolute: 0 K/uL (ref 0.0–0.1)
Basophils Relative: 1 %
Eosinophils Absolute: 0.1 K/uL (ref 0.0–0.5)
Eosinophils Relative: 3 %
HCT: 37.6 % (ref 36.0–46.0)
Hemoglobin: 12.3 g/dL (ref 12.0–15.0)
Immature Granulocytes: 1 %
Lymphocytes Relative: 31 %
Lymphs Abs: 1.6 K/uL (ref 0.7–4.0)
MCH: 27.3 pg (ref 26.0–34.0)
MCHC: 32.7 g/dL (ref 30.0–36.0)
MCV: 83.4 fL (ref 80.0–100.0)
Monocytes Absolute: 0.4 K/uL (ref 0.1–1.0)
Monocytes Relative: 7 %
Neutro Abs: 2.9 K/uL (ref 1.7–7.7)
Neutrophils Relative %: 57 %
Platelet Count: 253 K/uL (ref 150–400)
RBC: 4.51 MIL/uL (ref 3.87–5.11)
RDW: 12.6 % (ref 11.5–15.5)
WBC Count: 5.1 K/uL (ref 4.0–10.5)
nRBC: 0 % (ref 0.0–0.2)

## 2024-03-20 LAB — IRON AND TIBC
Iron: 55 ug/dL (ref 28–170)
Saturation Ratios: 15 % (ref 10.4–31.8)
TIBC: 358 ug/dL (ref 250–450)
UIBC: 304 ug/dL

## 2024-03-20 LAB — CMP (CANCER CENTER ONLY)
ALT: 15 U/L (ref 0–44)
AST: 13 U/L — ABNORMAL LOW (ref 15–41)
Albumin: 4.2 g/dL (ref 3.5–5.0)
Alkaline Phosphatase: 60 U/L (ref 38–126)
Anion gap: 8 (ref 5–15)
BUN: 13 mg/dL (ref 6–20)
CO2: 25 mmol/L (ref 22–32)
Calcium: 8.8 mg/dL — ABNORMAL LOW (ref 8.9–10.3)
Chloride: 104 mmol/L (ref 98–111)
Creatinine: 0.76 mg/dL (ref 0.44–1.00)
GFR, Estimated: >60 mL/min (ref >60)
Glucose, Bld: 108 mg/dL — ABNORMAL HIGH (ref 70–99)
Potassium: 4.1 mmol/L (ref 3.5–5.1)
Sodium: 137 mmol/L (ref 135–145)
Total Bilirubin: 0.7 mg/dL (ref 0.0–1.2)
Total Protein: 7.9 g/dL (ref 6.5–8.1)

## 2024-03-20 LAB — RETIC PANEL
Immature Retic Fract: 9.2 % (ref 2.3–15.9)
RBC.: 4.41 MIL/uL (ref 3.87–5.11)
Retic Count, Absolute: 77.2 K/uL (ref 19.0–186.0)
Retic Ct Pct: 1.8 % (ref 0.4–3.1)
Reticulocyte Hemoglobin: 31 pg (ref >27.9)

## 2024-03-20 LAB — FOLATE: Folate: 7.6 ng/mL (ref >5.9)

## 2024-03-20 LAB — VITAMIN B12: Vitamin B-12: 349 pg/mL (ref 180–914)

## 2024-03-20 LAB — FERRITIN: Ferritin: 55 ng/mL (ref 11–307)

## 2024-03-21 ENCOUNTER — Inpatient Hospital Stay: Admitting: Oncology

## 2024-03-21 ENCOUNTER — Encounter: Payer: Self-pay | Admitting: Oncology

## 2024-03-21 ENCOUNTER — Inpatient Hospital Stay

## 2024-03-21 VITALS — BP 111/73 | HR 58 | Temp 96.7°F | Resp 18 | Wt 163.5 lb

## 2024-03-21 DIAGNOSIS — E538 Deficiency of other specified B group vitamins: Secondary | ICD-10-CM

## 2024-03-21 DIAGNOSIS — D500 Iron deficiency anemia secondary to blood loss (chronic): Secondary | ICD-10-CM

## 2024-03-21 MED ORDER — CYANOCOBALAMIN 1000 MCG/ML IJ SOLN
1000.0000 ug | Freq: Once | INTRAMUSCULAR | Status: AC
  Administered 2024-03-21: 1000 ug via INTRAMUSCULAR
  Filled 2024-03-21: qty 1

## 2024-03-21 NOTE — Assessment & Plan Note (Signed)
 Labs are reviewed and discussed with patient.  Lab Results  Component Value Date   HGB 12.3 03/20/2024   TIBC 358 03/20/2024   IRONPCTSAT 15 03/20/2024   FERRITIN 55 03/20/2024    Hemoglobin and iron  panel are stable.  Hold off IV Venofer  treatment

## 2024-03-21 NOTE — Progress Notes (Signed)
 [facility] Cancer Progress Note Patient Care Team: Sadie Manna, MD as PCP - General (Internal Medicine) Babara Call, MD as Consulting Physician (Oncology)    ASSESSMENT & PLAN:   Vitamin B 12 deficiency B12 is chronically at low normal end.  She prefers parenteral B12 injections than oral B12 supplementation. Recommend B12 1000 mcg IM injection every 3 months.    IDA (iron  deficiency anemia) Labs are reviewed and discussed with patient.  Lab Results  Component Value Date   HGB 12.3 03/20/2024   TIBC 358 03/20/2024   IRONPCTSAT 15 03/20/2024   FERRITIN 55 03/20/2024    Hemoglobin and iron  panel are stable.  Hold off IV Venofer  treatment  Orders Placed This Encounter  Procedures   CBC with Differential (Cancer Center Only)    Standing Status:   Future    Expected Date:   09/18/2024    Expiration Date:   12/17/2024   Iron  and TIBC    Standing Status:   Future    Expected Date:   09/18/2024    Expiration Date:   12/17/2024   Ferritin    Standing Status:   Future    Expected Date:   09/18/2024    Expiration Date:   12/17/2024   Vitamin B12    Standing Status:   Future    Expected Date:   09/18/2024    Expiration Date:   12/17/2024   Follow up 6 months All questions were answered. The patient knows to call the clinic with any problems, questions or concerns.  Call Babara, MD, PhD [facility] Hematology Oncology 03/21/2024    REASON FOR VISIT Follow up for treatment of iron  deficiency anemia  HISTORY OF PRESENTING ILLNESS: Lisa Walker 38 y.o. Indian female with past medical history listed as below is referred by primary care provider to me for evaluation and management of iron  deficiency anemia. Patient speaks English fluently and she tells me she is able to understand me well. Her husband came to the room and joined our conversation later. Patient reports having history of heavy menstrual period. She also had 2 termination of pregnancy. She was found to have  iron  deficiency anemia and has been on iron  supplementation orally for the past year. However her iron  store does not improve too much. Patient is still feels very fatigue and weakness. 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 She had the surgery for resection of the cyst and currently her neurologic symptoms are better. She is married and has 2 kids, youngest kid is 63 years old. She is not breast feeding. She is not a vegetarian and she consume red meat on  regular basis. Denies any family history of cancer. # patient went to [facility] for evaluation of a painful right upper extremity mass. S/p incision biopsy. 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  .  H. pylori IgG 1.06, urine culture positive for beta-hemolytic Streptococcus group B Patient was started on H. pylori treatments with amoxicillin /clarithromycin/Flagyl/omeprazole.  INTERVAL HISTORY Patient presents for management of history of iron  deficiency anemia. She feels tired.    Review of Systems  Constitutional:  Positive for fatigue. Negative for appetite change, chills, fever and unexpected weight change.  HENT:   Negative for hearing loss, nosebleeds, sore throat and voice change.   Eyes:  Negative for eye problems.  Respiratory:  Negative for chest tightness, cough and hemoptysis.   Cardiovascular:  Negative for chest pain.  Gastrointestinal:  Negative for abdominal distention, abdominal pain, blood in stool and constipation.  Endocrine: Negative for hot flashes.  Genitourinary:  Negative for difficulty urinating, dysuria, frequency and hematuria.   Musculoskeletal:  Negative for arthralgias, flank pain and myalgias.  Skin:  Negative for itching and rash.  Neurological:  Negative for dizziness, extremity weakness, headaches, light-headedness and numbness.  Hematological:  Negative for adenopathy.  Psychiatric/Behavioral:   Negative for confusion and decreased concentration. The patient is not nervous/anxious.     MEDICAL HISTORY: Past Medical History:  Diagnosis Date   Anemia    Anxiety    GERD (gastroesophageal reflux disease)    H/O   H/O cervical spine surgery    pt reports there is metal in neck   PONV (postoperative nausea and vomiting)     SURGICAL HISTORY: Past Surgical History:  Procedure Laterality Date   CESAREAN SECTION N/A 2011   performed in [place]   CESAREAN SECTION N/A 09/03/2014   Procedure: repeat CESAREAN SECTION;  Surgeon: Bebe Furry, MD;  Location: [facility] ORS;  Service: Obstetrics;  Laterality: N/A;   HYSTEROSCOPY WITH D & C N/A 02/24/2019   Procedure: DILATATION AND CURETTAGE /HYSTEROSCOPY;  Surgeon: Ward, Mitzie BROCKS, MD;  Location: [facility] ORS;  Service: Gynecology;  Laterality: N/A;   SKIN BIOPSY Right     SOCIAL HISTORY: Social History   Socioeconomic History   Marital status: Married    Spouse name: Not on file   Number of children: Not on file   Years of education: Not on file   Highest education level: Not on file  Occupational History   Not on file  Tobacco Use   Smoking status: Never   Smokeless tobacco: Never  Vaping Use   Vaping status: Never Used  Substance and Sexual Activity   Alcohol use: No    Alcohol/week: 0.0 standard drinks of alcohol   Drug use: No   Sexual activity: Yes    Birth control/protection: None  Other Topics Concern   Not on file  Social History Narrative   Not on file   Social Drivers of Health   Financial Resource Strain: Low Risk  (10/05/2023)   Received from [facility]   Overall Financial Resource Strain (CARDIA)    Difficulty of Paying Living Expenses: Not hard at all  Food Insecurity: No Food Insecurity (10/05/2023)   Received from [facility]   Hunger Vital Sign    Within the past 12 months, you worried that your food would run out before you got the money to buy more.: Never true     Within the past 12 months, the food you bought just didn't last and you didn't have money to get more.: Never true  Transportation Needs: No Transportation Needs (10/05/2023)   Received from [facility] - Transportation    In the past 12 months, has lack of transportation kept you from medical appointments or from getting medications?: No    Lack of Transportation (Non-Medical): No  Physical Activity: Not on file  Stress: Not on file  Social Connections: Not on file  Intimate Partner Violence: Not on file    FAMILY HISTORY Family History  Problem Relation Age of Onset   Diabetes Mother    Diabetes Father    Breast cancer Neg Hx     ALLERGIES:  is allergic to contrast media [iodinated contrast media], multihance  [gadobenate], and pineapple.  MEDICATIONS:  Current Outpatient  Medications  Medication Sig Dispense Refill   Vitamin D , Ergocalciferol , (DRISDOL) 1.25 MG (50000 UNIT) CAPS capsule Take 50,000 Units by mouth every 7 (seven) days.     No current facility-administered medications for this visit.    PHYSICAL EXAMINATION:  ECOG PERFORMANCE STATUS: 0 - Asymptomatic   Vitals:   03/21/24 1426  BP: 111/73  Pulse: (!) 58  Resp: 18  Temp: (!) 96.7 F (35.9 C)  SpO2: 98%    Filed Weights   03/21/24 1426  Weight: 163 lb 8 oz (74.2 kg)     Physical Exam Constitutional:      General: She is not in acute distress.    Appearance: She is not diaphoretic.  HENT:     Head: Normocephalic and atraumatic.  Eyes:     General: No scleral icterus. Cardiovascular:     Rate and Rhythm: Normal rate and regular rhythm.     Heart sounds: No murmur heard. Pulmonary:     Effort: Pulmonary effort is normal. No respiratory distress.  Abdominal:     General: Bowel sounds are normal. There is no distension.     Palpations: Abdomen is soft.  Musculoskeletal:        General: Normal range of motion.     Cervical back: Normal range of motion and neck  supple.  Skin:    General: Skin is dry.     Findings: No erythema.  Neurological:     Mental Status: She is alert and oriented to person, place, and time. Mental status is at baseline.     Motor: No abnormal muscle tone.  Psychiatric:        Mood and Affect: Affect normal.     Labs    Latest Ref Rng & Units 03/20/2024    9:17 AM 11/12/2023    9:04 AM 05/07/2023    9:05 AM  CBC  WBC 4.0 - 10.5 K/uL 5.1  5.9  5.8   Hemoglobin 12.0 - 15.0 g/dL 87.6  88.0  87.5   Hematocrit 36.0 - 46.0 % 37.6  37.7  38.3   Platelets 150 - 400 K/uL 253  249  255       Latest Ref Rng & Units 03/20/2024    9:18 AM 04/08/2020   11:42 AM 12/19/2018    2:37 PM  CMP  Glucose 70 - 99 mg/dL 891  889  97   BUN 6 - 20 mg/dL 13  10  11    Creatinine 0.44 - 1.00 mg/dL 9.23  9.18  9.26   Sodium 135 - 145 mmol/L 137  136  138   Potassium 3.5 - 5.1 mmol/L 4.1  3.9  3.8   Chloride 98 - 111 mmol/L 104  102  107   CO2 22 - 32 mmol/L 25  25  22    Calcium 8.9 - 10.3 mg/dL 8.8  8.8  8.7   Total Protein 6.5 - 8.1 g/dL 7.9  7.7  7.9   Total Bilirubin 0.0 - 1.2 mg/dL 0.7  0.3  0.4   Alkaline Phos 38 - 126 U/L 60  63  64   AST 15 - 41 U/L 13  15  13    ALT 0 - 44 U/L 15  18  14      Iron /TIBC/Ferritin/ %Sat    Component Value Date/Time   IRON  55 03/20/2024 0917   TIBC 358 03/20/2024 0917   FERRITIN 55 03/20/2024 0917   IRONPCTSAT 15 03/20/2024 0917

## 2024-03-21 NOTE — Assessment & Plan Note (Addendum)
 B12 is chronically at low normal end.  She prefers parenteral B12 injections than oral B12 supplementation. Recommend B12 1000 mcg IM injection every 3 months.

## 2024-06-23 ENCOUNTER — Inpatient Hospital Stay

## 2024-09-28 ENCOUNTER — Inpatient Hospital Stay

## 2024-09-29 ENCOUNTER — Inpatient Hospital Stay: Admitting: Oncology

## 2024-09-29 ENCOUNTER — Inpatient Hospital Stay
